# Patient Record
Sex: Female | Born: 1960 | Race: White | Hispanic: No | Marital: Married | Smoking: Never smoker
Health system: Southern US, Community
[De-identification: ages and names within clinical notes are randomized; demographics above are authoritative.]

## PROBLEM LIST (undated history)

## (undated) DIAGNOSIS — T8859XA Other complications of anesthesia, initial encounter: Secondary | ICD-10-CM

## (undated) DIAGNOSIS — H9319 Tinnitus, unspecified ear: Secondary | ICD-10-CM

## (undated) DIAGNOSIS — Z87442 Personal history of urinary calculi: Secondary | ICD-10-CM

## (undated) DIAGNOSIS — F329 Major depressive disorder, single episode, unspecified: Secondary | ICD-10-CM

## (undated) DIAGNOSIS — I83893 Varicose veins of bilateral lower extremities with other complications: Secondary | ICD-10-CM

## (undated) DIAGNOSIS — F32A Depression, unspecified: Secondary | ICD-10-CM

## (undated) DIAGNOSIS — M797 Fibromyalgia: Secondary | ICD-10-CM

## (undated) DIAGNOSIS — G4730 Sleep apnea, unspecified: Secondary | ICD-10-CM

## (undated) DIAGNOSIS — R42 Dizziness and giddiness: Secondary | ICD-10-CM

## (undated) DIAGNOSIS — E049 Nontoxic goiter, unspecified: Secondary | ICD-10-CM

## (undated) DIAGNOSIS — M722 Plantar fascial fibromatosis: Secondary | ICD-10-CM

## (undated) DIAGNOSIS — M1990 Unspecified osteoarthritis, unspecified site: Secondary | ICD-10-CM

## (undated) DIAGNOSIS — N200 Calculus of kidney: Secondary | ICD-10-CM

## (undated) DIAGNOSIS — K219 Gastro-esophageal reflux disease without esophagitis: Secondary | ICD-10-CM

## (undated) DIAGNOSIS — R519 Headache, unspecified: Secondary | ICD-10-CM

## (undated) DIAGNOSIS — T4145XA Adverse effect of unspecified anesthetic, initial encounter: Secondary | ICD-10-CM

## (undated) DIAGNOSIS — M531 Cervicobrachial syndrome: Secondary | ICD-10-CM

## (undated) DIAGNOSIS — I10 Essential (primary) hypertension: Secondary | ICD-10-CM

## (undated) DIAGNOSIS — G243 Spasmodic torticollis: Secondary | ICD-10-CM

## (undated) DIAGNOSIS — H409 Unspecified glaucoma: Secondary | ICD-10-CM

## (undated) DIAGNOSIS — T7840XA Allergy, unspecified, initial encounter: Secondary | ICD-10-CM

## (undated) DIAGNOSIS — E785 Hyperlipidemia, unspecified: Secondary | ICD-10-CM

## (undated) DIAGNOSIS — F419 Anxiety disorder, unspecified: Secondary | ICD-10-CM

## (undated) HISTORY — PX: TONSILLECTOMY: SUR1361

## (undated) HISTORY — DX: Plantar fascial fibromatosis: M72.2

## (undated) HISTORY — DX: Unspecified glaucoma: H40.9

## (undated) HISTORY — DX: Allergy, unspecified, initial encounter: T78.40XA

## (undated) HISTORY — DX: Depression, unspecified: F32.A

## (undated) HISTORY — DX: Major depressive disorder, single episode, unspecified: F32.9

## (undated) HISTORY — PX: CHOLECYSTECTOMY: SHX55

## (undated) HISTORY — DX: Spasmodic torticollis: G24.3

## (undated) HISTORY — PX: COSMETIC SURGERY: SHX468

## (undated) HISTORY — DX: Gastro-esophageal reflux disease without esophagitis: K21.9

## (undated) HISTORY — DX: Fibromyalgia: M79.7

## (undated) HISTORY — DX: Nontoxic goiter, unspecified: E04.9

## (undated) HISTORY — DX: Unspecified osteoarthritis, unspecified site: M19.90

## (undated) HISTORY — DX: Cervicobrachial syndrome: M53.1

## (undated) HISTORY — PX: GALLBLADDER SURGERY: SHX652

## (undated) HISTORY — DX: Hyperlipidemia, unspecified: E78.5

## (undated) HISTORY — DX: Sleep apnea, unspecified: G47.30

---
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
## 2016-10-19 DIAGNOSIS — G243 Spasmodic torticollis: Secondary | ICD-10-CM | POA: Insufficient documentation

## 2017-02-26 ENCOUNTER — Emergency Department
Admission: EM | Admit: 2017-02-26 | Discharge: 2017-02-26 | Disposition: A | Discharge status: Home / Self Care | Payer: BLUE CROSS/BLUE SHIELD | Attending: Emergency Medicine | Admitting: Emergency Medicine

## 2017-02-26 ENCOUNTER — Encounter: Payer: Self-pay | Admitting: Emergency Medicine

## 2017-02-26 ENCOUNTER — Emergency Department: Payer: BLUE CROSS/BLUE SHIELD

## 2017-02-26 DIAGNOSIS — I10 Essential (primary) hypertension: Secondary | ICD-10-CM | POA: Insufficient documentation

## 2017-02-26 DIAGNOSIS — R0789 Other chest pain: Secondary | ICD-10-CM | POA: Diagnosis not present

## 2017-02-26 DIAGNOSIS — R079 Chest pain, unspecified: Secondary | ICD-10-CM

## 2017-02-26 DIAGNOSIS — Z79899 Other long term (current) drug therapy: Secondary | ICD-10-CM | POA: Diagnosis not present

## 2017-02-26 DIAGNOSIS — R109 Unspecified abdominal pain: Secondary | ICD-10-CM | POA: Diagnosis present

## 2017-02-26 HISTORY — DX: Anxiety disorder, unspecified: F41.9

## 2017-02-26 HISTORY — DX: Essential (primary) hypertension: I10

## 2017-02-26 LAB — CBC
HCT: 46 % (ref 35.0–47.0)
Hemoglobin: 15.9 g/dL (ref 12.0–16.0)
MCH: 31 pg (ref 26.0–34.0)
MCHC: 34.5 g/dL (ref 32.0–36.0)
MCV: 89.8 fL (ref 80.0–100.0)
Platelets: 258 10*3/uL (ref 150–440)
RBC: 5.13 MIL/uL (ref 3.80–5.20)
RDW: 13.6 % (ref 11.5–14.5)
WBC: 8 10*3/uL (ref 3.6–11.0)

## 2017-02-26 LAB — TROPONIN I
Troponin I: <0.03 ng/mL (ref <0.03)
Troponin I: <0.03 ng/mL (ref <0.03)

## 2017-02-26 LAB — COMPREHENSIVE METABOLIC PANEL
ALT: 19 U/L (ref 14–54)
AST: 24 U/L (ref 15–41)
Albumin: 4 g/dL (ref 3.5–5.0)
Alkaline Phosphatase: 59 U/L (ref 38–126)
Anion gap: 9 (ref 5–15)
BUN: 9 mg/dL (ref 6–20)
CO2: 26 mmol/L (ref 22–32)
Calcium: 9.3 mg/dL (ref 8.9–10.3)
Chloride: 103 mmol/L (ref 101–111)
Creatinine, Ser: 0.67 mg/dL (ref 0.44–1.00)
GFR calc Af Amer: >60 mL/min (ref >60)
GFR calc non Af Amer: >60 mL/min (ref >60)
Glucose, Bld: 115 mg/dL — ABNORMAL HIGH (ref 65–99)
Potassium: 4.1 mmol/L (ref 3.5–5.1)
Sodium: 138 mmol/L (ref 135–145)
Total Bilirubin: 0.8 mg/dL (ref 0.3–1.2)
Total Protein: 7.1 g/dL (ref 6.5–8.1)

## 2017-02-26 MED ORDER — HYDROCHLOROTHIAZIDE 12.5 MG PO CAPS: 12.5000 mg | ORAL_CAPSULE | Freq: Every day | ORAL | 0 refills | Status: DC

## 2017-02-26 MED ORDER — HYDROCHLOROTHIAZIDE 12.5 MG PO CAPS
12.5000 mg | ORAL_CAPSULE | Freq: Every day | ORAL | Status: DC
  Administered 2017-02-26: 12.5 mg via ORAL
  Filled 2017-02-26: qty 1

## 2017-02-26 MED ORDER — IOPAMIDOL (ISOVUE-370) INJECTION 76%
125.0000 mL | Freq: Once | INTRAVENOUS | Status: AC | PRN
  Administered 2017-02-26: 125 mL via INTRAVENOUS

## 2017-02-26 NOTE — ED Triage Notes (Signed)
Pt in with co chest pain and abd pain tonight states happens nightly for a few months. States has been given meds for anxiety and took it tonight without relief.

## 2017-02-26 NOTE — ED Provider Notes (Signed)
  Physical Exam  BP (!) 177/104   Pulse 83   Resp 14   Ht 5\' 5"  (1.651 m)   Wt 131 lb (59.4 kg)   SpO2 97%   BMI 21.80 kg/m   Physical Exam  ED Course  Procedures  MDM Care assumed at 7 am from Dr. Owens Shark. Patient has been having intermittent chest and abdominal pain for months. Also uncontrolled hypertension. Lisinopril increased several days ago from 2.5 mg to 10 mg but BP still in the 170s. Has hx of anxiety as well and family hx of aortic aneurysm. CT dissection study showed no dissection or aneurysm. Sign out pending second trop.   9:26 AM Second trop neg. Still appears anxious. BP 177/104. I think likely symptomatic hypertension. Discussed either increase lisinopril or add HCTZ as per guidelines. Will prescribe HCTZ 12.5 mg daily. Will have her recheck BP with PCP and possibly stress test if she still has chest pain.        Drenda Freeze, MD 02/26/17 [phone]

## 2017-02-26 NOTE — ED Provider Notes (Signed)
[facility] Emergency Department Provider Note   First MD Initiated Contact with Patient 02/26/17 [phone]     (approximate)  I have reviewed the triage vital signs and the nursing notes.   HISTORY  Chief Complaint Chest Pain   HPI Denise Elliott is a 56 y.o. female with history of hypertension, dystonia and anxiety presents to the emergency department with complaint of chest and abdominal pain with onset tonight. Patient states this has been occurring "for a few months. Patient states symptoms usually occurs at night when she lays down. Patient denies any dyspnea no nausea vomiting no lower extremity pain or swelling. Patient does admit to family history (father) deceased in 33s secondary to a ruptured abdominal aortic aneurysm. Patient states pain score on arrival 8 out of 10.   Past Medical History:  Diagnosis Date  . Anxiety   . Hypertension     There are no active problems to display for this patient.   No past surgical history on file.  Prior to Admission medications   Not on File    Allergies Versed [midazolam]  No family history on file.  Social History Social History  Substance Use Topics  . Smoking status: Not on file  . Smokeless tobacco: Not on file  . Alcohol use Not on file    Review of Systems Constitutional: No fever/chills Eyes: No visual changes. ENT: No sore throat. Cardiovascular: Positive for chest pain. Respiratory: Denies shortness of breath. Gastrointestinal: Positive for abdominal pain.  No nausea, no vomiting.  No diarrhea.  No constipation. Genitourinary: Negative for dysuria. Musculoskeletal: Negative for back pain. Skin: Negative for rash. Neurological: Negative for headaches, focal weakness or numbness.  10-point ROS otherwise negative.  ____________________________________________   PHYSICAL EXAM:  VITAL SIGNS: ED Triage Vitals  Enc Vitals Group     BP --      Pulse --      Resp --      Temp  --      Temp src --      SpO2 --      Weight 02/26/17 0409 131 lb (59.4 kg)     Height 02/26/17 0409 5\' 5"  (1.651 m)     Head Circumference --      Peak Flow --      Pain Score 02/26/17 0410 8     Pain Loc --      Pain Edu? --      Excl. in [place]? --     Constitutional: Alert and oriented. Well appearing and in no acute distress. Eyes: Conjunctivae are normal. PERRL. EOMI. Head: Atraumatic. Mouth/Throat: Mucous membranes are moist.  Oropharynx non-erythematous. Neck: No stridor.   Cardiovascular: Normal rate, regular rhythm. Good peripheral circulation. Grossly normal heart sounds. Respiratory: Normal respiratory effort.  No retractions. Lungs CTAB. Gastrointestinal: Soft and nontender. No distention.  Musculoskeletal: No lower extremity tenderness nor edema. No gross deformities of extremities. Neurologic:  Normal speech and language. No gross focal neurologic deficits are appreciated.  Skin:  Skin is warm, dry and intact. No rash noted. Psychiatric: Mood and affect are normal. Speech and behavior are normal.  ____________________________________________   LABS (all labs ordered are listed, but only abnormal results are displayed)  Labs Reviewed  COMPREHENSIVE METABOLIC PANEL - Abnormal; Notable for the following:       Result Value   Glucose, Bld 115 (*)    All other components within normal limits  CBC  TROPONIN I  TROPONIN I  ____________________________________________  EKG  ED ECG REPORT I, Frederica N Paddock, the attending physician, personally viewed and interpreted this ECG.   Date: 02/26/2017  EKG Time: 4:10 AM  Rate: 90  Rhythm: Normal sinus rhythm  Axis: Normal  Intervals: Normal  ST&T Change: None  ____________________________________________  RADIOLOGY I,  N Hudnall, personally viewed and evaluated these images (plain radiographs) as part of my medical decision making, as well as reviewing the written report by the radiologist.  Ct Angio  Chest Aorta W And/or Wo Contrast  Result Date: 02/26/2017 CLINICAL DATA:  Recurrent chest and abdominal pain at night for few months. Family history of aortic dissection. History of hypertension, cholecystectomy. EXAM: CT ANGIOGRAPHY CHEST, ABDOMEN AND PELVIS TECHNIQUE: Multidetector CT imaging through the chest, abdomen and pelvis was performed using the standard protocol during bolus administration of intravenous contrast. Multiplanar reconstructed images and MIPs were obtained and reviewed to evaluate the vascular anatomy. CONTRAST:  125 cc Isovue 370 COMPARISON:  None. FINDINGS: CTA CHEST FINDINGS CARDIOVASCULAR: Thoracic aorta is normal course and caliber. No intrinsic density on noncontrast CT. Homogeneous contrast opacification of thoracic aorta without dissection, aneurysm, luminal irregularity, periaortic fluid collections, or contrast extravasation. Heart size is normal. No pericardial effusion. No central pulmonary embolism. MEDIASTINUM/NODES: No mediastinal mass or lymphadenopathy by CT size criteria. LUNGS/PLEURA: Tracheobronchial tree is patent, no pneumothorax. No pleural effusions, focal consolidations, pulmonary nodules or masses. MUSCULOSKELETAL:  Nonsuspicious.  Mildly heterogeneous thyroid. Review of the MIP images confirms the above findings. CTA ABDOMEN AND PELVIS FINDINGS ARTERIES: Abdominal aorta is normal course and caliber. Homogeneous contrast opacification of aortoiliac vessels without dissection, aneurysm, luminal irregularity, periaortic fluid collections, or contrast extravasation. Celiac axis, superior and inferior mesenteric arteries are normal. HEPATOBILIARY: Liver is diffusely hypodense compatible with steatosis, otherwise unremarkable. Absent gallbladder. PANCREAS: Normal. SPLEEN: Normal. ADRENALS/URINARY TRACT: Kidneys are orthotopic, demonstrating symmetric enhancement. No nephrolithiasis, hydronephrosis or solid renal masses. Exophytic 12 mm LEFT upper pole cyst. Too small to  characterize hypodensity lower pole of LEFT kidney. The unopacified ureters are normal in course and caliber. Urinary bladder is partially distended and unremarkable. Normal adrenal glands. STOMACH/BOWEL: The stomach, small and large bowel are normal in course and caliber without inflammatory changes. Mild retained large bowel stool. Small amount of small bowel feces compatible with chronic stasis. Normal appendix. VASCULAR/LYMPHATIC: No lymphadenopathy by CT size criteria. REPRODUCTIVE: Normal. OTHER: No intraperitoneal free fluid or free air. MUSCULOSKELETAL: Nonacute.  Scattered Schmorl's nodes. Review of the MIP images confirms the above findings. IMPRESSION: CTA CHEST: No acute vascular process or acute cardiopulmonary disease. CTA ABDOMEN AND PELVIS: No acute vascular process or or acute intra-abdominal/pelvic disease. Electronically Signed   By: Elon Alas M.D.   On: 02/26/2017 06:56   Ct Angio Abd/pel W And/or Wo Contrast  Result Date: 02/26/2017 CLINICAL DATA:  Recurrent chest and abdominal pain at night for few months. Family history of aortic dissection. History of hypertension, cholecystectomy. EXAM: CT ANGIOGRAPHY CHEST, ABDOMEN AND PELVIS TECHNIQUE: Multidetector CT imaging through the chest, abdomen and pelvis was performed using the standard protocol during bolus administration of intravenous contrast. Multiplanar reconstructed images and MIPs were obtained and reviewed to evaluate the vascular anatomy. CONTRAST:  125 cc Isovue 370 COMPARISON:  None. FINDINGS: CTA CHEST FINDINGS CARDIOVASCULAR: Thoracic aorta is normal course and caliber. No intrinsic density on noncontrast CT. Homogeneous contrast opacification of thoracic aorta without dissection, aneurysm, luminal irregularity, periaortic fluid collections, or contrast extravasation. Heart size is normal. No pericardial effusion. No central pulmonary embolism. MEDIASTINUM/NODES:  No mediastinal mass or lymphadenopathy by CT size  criteria. LUNGS/PLEURA: Tracheobronchial tree is patent, no pneumothorax. No pleural effusions, focal consolidations, pulmonary nodules or masses. MUSCULOSKELETAL:  Nonsuspicious.  Mildly heterogeneous thyroid. Review of the MIP images confirms the above findings. CTA ABDOMEN AND PELVIS FINDINGS ARTERIES: Abdominal aorta is normal course and caliber. Homogeneous contrast opacification of aortoiliac vessels without dissection, aneurysm, luminal irregularity, periaortic fluid collections, or contrast extravasation. Celiac axis, superior and inferior mesenteric arteries are normal. HEPATOBILIARY: Liver is diffusely hypodense compatible with steatosis, otherwise unremarkable. Absent gallbladder. PANCREAS: Normal. SPLEEN: Normal. ADRENALS/URINARY TRACT: Kidneys are orthotopic, demonstrating symmetric enhancement. No nephrolithiasis, hydronephrosis or solid renal masses. Exophytic 12 mm LEFT upper pole cyst. Too small to characterize hypodensity lower pole of LEFT kidney. The unopacified ureters are normal in course and caliber. Urinary bladder is partially distended and unremarkable. Normal adrenal glands. STOMACH/BOWEL: The stomach, small and large bowel are normal in course and caliber without inflammatory changes. Mild retained large bowel stool. Small amount of small bowel feces compatible with chronic stasis. Normal appendix. VASCULAR/LYMPHATIC: No lymphadenopathy by CT size criteria. REPRODUCTIVE: Normal. OTHER: No intraperitoneal free fluid or free air. MUSCULOSKELETAL: Nonacute.  Scattered Schmorl's nodes. Review of the MIP images confirms the above findings. IMPRESSION: CTA CHEST: No acute vascular process or acute cardiopulmonary disease. CTA ABDOMEN AND PELVIS: No acute vascular process or or acute intra-abdominal/pelvic disease. Electronically Signed   By: Elon Alas M.D.   On: 02/26/2017 06:56     Procedures     INITIAL IMPRESSION / ASSESSMENT AND PLAN / ED COURSE  Pertinent labs & imaging  results that were available during my care of the patient were reviewed by me and considered in my medical decision making (see chart for details).  56 year old female presenting with midline abdominal and chest pain times "few months". Given presenting complaint an family history of aortic dissection CT angiogram performed of the chest and abdomen which revealed no evidence of aortic aneurysm or dissection. Will obtain repeat troponin. Patient's care transferred to Dr. Darl Householder      ____________________________________________  FINAL CLINICAL IMPRESSION(S) / ED DIAGNOSES  Final diagnoses:  Chest pain, unspecified type     MEDICATIONS GIVEN DURING THIS VISIT:  Medications  iopamidol (ISOVUE-370) 76 % injection 125 mL (125 mLs Intravenous Contrast Given 02/26/17 0629)     NEW OUTPATIENT MEDICATIONS STARTED DURING THIS VISIT:  New Prescriptions   No medications on file    Modified Medications   No medications on file    Discontinued Medications   No medications on file     Note:  This document was prepared using Dragon voice recognition software and may include unintentional dictation errors.    Gregor Hams, MD 02/26/17 [phone]

## 2017-02-26 NOTE — Discharge Instructions (Signed)
Your blood pressure is 177/104 in the ED.   You need to continue taking lisinopril 10 mg daily as prescribed by your doctor.   Add HCTZ 12.5 mg daily.   Recheck blood pressure with your doctor in a week. Consider stress test if you still have chest pain at that time   Return to ER if you have worse chest pain, shortness of breath, abdominal pain, vomiting.

## 2017-03-06 ENCOUNTER — Ambulatory Visit: Payer: 59 | Admitting: Urology

## 2017-03-06 ENCOUNTER — Encounter: Payer: Self-pay | Admitting: Urology

## 2017-03-06 VITALS — BP 136/78 | HR 89 | Ht 65.0 in | Wt 127.0 lb

## 2017-03-06 DIAGNOSIS — N368 Other specified disorders of urethra: Secondary | ICD-10-CM

## 2017-03-06 DIAGNOSIS — R1030 Lower abdominal pain, unspecified: Secondary | ICD-10-CM

## 2017-03-06 DIAGNOSIS — N8111 Cystocele, midline: Secondary | ICD-10-CM

## 2017-03-06 LAB — URINALYSIS, COMPLETE
Bilirubin, UA: NEGATIVE
Glucose, UA: NEGATIVE
Ketones, UA: NEGATIVE
Leukocytes, UA: NEGATIVE
Nitrite, UA: NEGATIVE
Protein, UA: NEGATIVE
Specific Gravity, UA: 1.01 (ref 1.005–1.030)
Urobilinogen, Ur: 0.2 mg/dL (ref 0.2–1.0)
pH, UA: 7 (ref 5.0–7.5)

## 2017-03-06 LAB — MICROSCOPIC EXAMINATION
Epithelial Cells (non renal): >10 /hpf — ABNORMAL HIGH (ref 0–10)
RBC, UA: NONE SEEN /hpf (ref 0–?)

## 2017-03-06 NOTE — Progress Notes (Signed)
03/06/2017 8:20 AM   Denise Elliott Jun 14, 1961 [id number]  Referring provider: Ronita Hipps, MD [place] [address],  Chief Complaint  Patient presents with  . New Patient (Initial Visit)    Urethral disorder referred by Roosvelt Harps    HPI: 56 year old female referred for urethral abnormality.  Mrs. Fullenwider notes that she developed some soreness around her urethral area when wiping about 3 weeks ago. In the mirror, she saw an area of redness with some cheesy material around her clitoris and also felt a lump on the lower right side. This improved spontaneously after about a week. She saw her PCP on 02/18/2017 at which time a pelvic exam described a small opening that expresses serosanguineous appearing "urine "which questionably represented a fistula or cyst per Dr. Helene Kelp.   She does note that over the past month, she's had some lower pelvic midline burning-like pain and an area which she feels is her bladder but only when lying in the supine position. Is not associated with urination and does not improve or worsen with voiding.  This has improved recently after starting Nexium.  She notes no dysuria, gross hematuria, frequency, urgency, or any other signs or symptoms of UTI. She does not have recurrent urinary tract infections.  She has no leakage of left coughing and sneezing. She has had 3 vaginal deliveries. PMH: Past Medical History:  Diagnosis Date  . Anxiety   . Cervical dystonia   . Depression   . Fibromyalgia   . HLD (hyperlipidemia)   . Hypertension   . Sleep apnea     Surgical History: Past Surgical History:  Procedure Laterality Date  . GALLBLADDER SURGERY    . TONSILLECTOMY      Home Medications:  Allergies as of 03/06/2017      Reactions   Versed [midazolam] Other (See Comments)   htn      Medication List       Accurate as of 03/06/17 11:59 PM. Always use your most recent med list.          baclofen 20 MG tablet Commonly  known as:  LIORESAL Take 10 mg by mouth 2 (two) times daily.   CALCIUM ACETATE PO Take by mouth.   citalopram 10 MG tablet Commonly known as:  CELEXA Take 10 mg by mouth daily.   fluticasone 50 MCG/ACT nasal spray Commonly known as:  FLONASE Place into the nose.   hydrochlorothiazide 12.5 MG capsule Commonly known as:  MICROZIDE Take 1 capsule (12.5 mg total) by mouth daily.   lisinopril 10 MG tablet Commonly known as:  PRINIVIL,ZESTRIL Take 10 mg by mouth daily.   Melatonin 3 MG Tabs Take by mouth.   MULTI-VITAMINS Tabs Take 1 tablet by mouth daily.   PREMPRO 0.625-2.5 MG tablet Generic drug:  estrogen (conjugated)-medroxyprogesterone Take 1 tablet by mouth daily.   ranitidine 150 MG capsule Commonly known as:  ZANTAC Take 150 mg by mouth 2 (two) times daily.   triamcinolone ointment 0.1 % Commonly known as:  KENALOG Apply topically.   VITAMIN B COMPLEX PO Take by mouth.   Vitamin D3 1000 units Caps Take by mouth.   ZINC ACETATE PO Take by mouth.       Allergies:  Allergies  Allergen Reactions  . Versed [Midazolam] Other (See Comments)    htn    Family History: Family History  Problem Relation Age of Onset  . Bladder Cancer Neg Hx   . Kidney cancer Neg Hx   .  Prostate cancer Neg Hx     Social History:  reports that she has never smoked. She has never used smokeless tobacco. She reports that she drinks alcohol. She reports that she does not use drugs.  ROS: UROLOGY Frequent Urination?: No Hard to postpone urination?: No Burning/pain with urination?: No Get up at night to urinate?: Yes Leakage of urine?: No Urine stream starts and stops?: No Trouble starting stream?: No Do you have to strain to urinate?: No Blood in urine?: No Urinary tract infection?: No Sexually transmitted disease?: No Injury to kidneys or bladder?: No Painful intercourse?: No Weak stream?: No Currently pregnant?: No Vaginal bleeding?: No Last menstrual period?:  n  Gastrointestinal Nausea?: No Vomiting?: No Indigestion/heartburn?: No Diarrhea?: Yes Constipation?: Yes  Constitutional Fever: No Night sweats?: No Weight loss?: No Fatigue?: No  Skin Skin rash/lesions?: No Itching?: No  Eyes Blurred vision?: No Double vision?: No  Ears/Nose/Throat Sore throat?: No Sinus problems?: Yes  Hematologic/Lymphatic Swollen glands?: No Easy bruising?: Yes  Cardiovascular Leg swelling?: No Chest pain?: No  Respiratory Cough?: No Shortness of breath?: No  Endocrine Excessive thirst?: No  Musculoskeletal Back pain?: No Joint pain?: No  Neurological Headaches?: No Dizziness?: No  Psychologic Depression?: No Anxiety?: Yes  Physical Exam: BP 136/78   Pulse 89   Ht 5\' 5"  (1.651 m)   Wt 127 lb (57.6 kg)   BMI 21.13 kg/m   Constitutional:  Alert and oriented, No acute distress. HEENT: [place] AT, moist mucus membranes.  Trachea midline, no masses. Cardiovascular: No clubbing, cyanosis, or edema. Respiratory: Normal respiratory effort, no increased work of breathing. GI: Abdomen is soft, nontender, nondistended, no abdominal masses GU: Pelvic exam today chaperoned by Fonnie Jarvis. Mild diffuse atrophic vaginitis appreciated. Normal external genitalia. Normal periurethral tissue. No redness or raised area. No urethral diverticulum or fistula appreciated. No significant urethral hypermobility or leakage with Valsalva. Stage I cystocele with Valsalva. Skin: No rashes, bruises or suspicious lesions. Lymph: No inguinal adenopathy. Neurologic: Grossly intact, no focal deficits, moving all 4 extremities. Psychiatric: Normal mood and affect.  Laboratory Data: Lab Results  Component Value Date   WBC 8.0 02/26/2017   HGB 15.9 02/26/2017   HCT 46.0 02/26/2017   MCV 89.8 02/26/2017   PLT 258 02/26/2017    Lab Results  Component Value Date   CREATININE 0.67 02/26/2017   Urinalysis Results for orders placed or performed in visit on  03/06/17  Microscopic Examination  Result Value Ref Range   WBC, UA 0-5 0 - 5 /hpf   RBC, UA None seen 0 - 2 /hpf   Epithelial Cells (non renal) >10 (H) 0 - 10 /hpf   Bacteria, UA Few (A) None seen/Few  Urinalysis, Complete  Result Value Ref Range   Specific Gravity, UA 1.010 1.005 - 1.030   pH, UA 7.0 5.0 - 7.5   Color, UA Yellow Yellow   Appearance Ur Cloudy (A) Clear   Leukocytes, UA Negative Negative   Protein, UA Negative Negative/Trace   Glucose, UA Negative Negative   Ketones, UA Negative Negative   RBC, UA Trace (A) Negative   Bilirubin, UA Negative Negative   Urobilinogen, Ur 0.2 0.2 - 1.0 mg/dL   Nitrite, UA Negative Negative   Microscopic Examination See below:     Pertinent Imaging: n/a  Assessment & Plan:    1. Skene's duct cyst Although not appreciated today, I suspect that this was possibly an infected Skene's duct cyst which spontaneously ruptured [place] on the patient's description  as well as the examination by her PCP.  It appears to have resolved.  No further intervention or workup needed at this time unless symptoms recur. - Urinalysis, Complete  2. Lower abdominal pain Etiology unclear and improving Although I do not suspect the patient has IC, she may benefit from this low acidic diet, information given today  3. Cystocele, midline Mild, asymptomatic   Return if symptoms worsen or fail to improve.  Hollice Espy, MD  [facility] [address] [place], [place] [address] [phone]

## 2017-03-25 DIAGNOSIS — F5105 Insomnia due to other mental disorder: Secondary | ICD-10-CM | POA: Insufficient documentation

## 2017-03-25 DIAGNOSIS — F418 Other specified anxiety disorders: Secondary | ICD-10-CM | POA: Insufficient documentation

## 2017-03-25 DIAGNOSIS — Z8739 Personal history of other diseases of the musculoskeletal system and connective tissue: Secondary | ICD-10-CM | POA: Insufficient documentation

## 2017-03-25 DIAGNOSIS — M5481 Occipital neuralgia: Secondary | ICD-10-CM | POA: Insufficient documentation

## 2017-06-27 ENCOUNTER — Ambulatory Visit (INDEPENDENT_AMBULATORY_CARE_PROVIDER_SITE_OTHER): Payer: 59 | Admitting: Nurse Practitioner

## 2017-06-27 ENCOUNTER — Encounter: Payer: Self-pay | Admitting: Nurse Practitioner

## 2017-06-27 VITALS — BP 125/68 | HR 75 | Temp 98.6°F | Ht 65.5 in | Wt 127.2 lb

## 2017-06-27 DIAGNOSIS — Z1239 Encounter for other screening for malignant neoplasm of breast: Secondary | ICD-10-CM

## 2017-06-27 DIAGNOSIS — Z1231 Encounter for screening mammogram for malignant neoplasm of breast: Secondary | ICD-10-CM | POA: Diagnosis not present

## 2017-06-27 DIAGNOSIS — Z7689 Persons encountering health services in other specified circumstances: Secondary | ICD-10-CM | POA: Diagnosis not present

## 2017-06-27 DIAGNOSIS — F419 Anxiety disorder, unspecified: Secondary | ICD-10-CM

## 2017-06-27 DIAGNOSIS — Z1211 Encounter for screening for malignant neoplasm of colon: Secondary | ICD-10-CM | POA: Diagnosis not present

## 2017-06-27 MED ORDER — LISINOPRIL 10 MG PO TABS: 10.0000 mg | ORAL_TABLET | Freq: Every day | ORAL | 2 refills | Status: DC

## 2017-06-27 MED ORDER — HYDROCHLOROTHIAZIDE 12.5 MG PO CAPS: 12.5000 mg | ORAL_CAPSULE | Freq: Every day | ORAL | 2 refills | Status: DC

## 2017-06-27 MED ORDER — BUSPIRONE HCL 5 MG PO TABS: 5.0000 mg | ORAL_TABLET | Freq: Two times a day (BID) | ORAL | 1 refills | Status: DC

## 2017-06-27 MED ORDER — CONJ ESTROG-MEDROXYPROGEST ACE 0.45-1.5 MG PO TABS: 1.0000 | ORAL_TABLET | Freq: Every day | ORAL | 2 refills | Status: DC

## 2017-06-27 NOTE — Patient Instructions (Addendum)
[place], Thank you for coming in to clinic today.  1. Colon Cancer Screening: - For all adults age 58 and older, routine colon cancer screening is highly recommended. - Early detection of colon cancer is important, because often there are no warning signs or symptoms.  If colon cancer is found early, usually it can be cured. Advanced cancer is hard to treat.  - If you are not interested in Colonoscopy screening (if done and normal you could be cleared for 5 to 10 years until next due), then Cologuard is an excellent alternative for screening test for Colon Cancer. It is highly sensitive for detecting DNA of colon cancer from even the earliest stages. Also, there is NO bowel prep required. - If Cologuard is NEGATIVE, then it is good for 3 years before next due - If Cologuard is POSITIVE, then it is strongly advised to get a Colonoscopy, which allows the GI doctor to locate the source of the cancer or polyp (even very early stage) and treat it by removing it. ------------------------- If you would like to proceed with Cologuard (stool DNA test) - FIRST, call your insurance company and tell them you want to check cost of Cologuard tell them CPT Code [phone] (it may be completely covered with a small or no cost, OR max cost without any coverage is about $600). If you do NOT open the kit, and decide not to do the test, you will NOT be charged, you should contact the company to return the kit if you decide not to do the test. - If you want to proceed, you can notify us (office phone, [place], or at next visit) and we will order it for you. The test kit will be delivered to your house in about 1 week. Follow instructions to collect your stool sample.  You may call the company for any help or questions, 24/7 telephone support at [phone].   2. For your anxiety - stop citalopram and start taking buspirone. START buspirone 5 mg once daily for 1 week.  Increase to twice daily for 1 week.  If  effective at lower dose, may take lower dose.  We can increase the dose if you still need more relief from being anxious.  3. For your blood pressure: - Continue your current medicines  4. Prempro will be the 2nd strength, which you were taking before.  Consider stopping this at any point before you turn 60, preferably sooner.  5. Mammogram Your mammogram order has been placed and is good for 18 months.  Call the Scheduling phone number at [phone] to schedule your mammogram at your convenience.  You can choose to go to either location listed below.  Let the scheduler know which location you prefer.  [facility] [address] [place], [place] [address] [place], [place] [address]      Please schedule a follow-up appointment with [name], AGNP to Return in about 1 month (around 07/28/2017) for GERD/fibromyalgia for one appointment and  annual physical exam in the next 3 months.   If you have any other questions or concerns, please feel free to call the clinic or send a message through [place]. You may also schedule an earlier appointment if necessary.  [name], DNP, AGNP-BC Adult Gerontology Nurse Practitioner [place]

## 2017-06-27 NOTE — Progress Notes (Signed)
Subjective:    Patient ID: Denise Elliott, female    DOB: 06-10-1961, 56 y.o.   MRN: [id number]  Denise Elliott is a 56 y.o. female presenting on 06/27/2017 for Establish Care (needs refills of medication)   HPI Establish Care New Provider Pt last seen by PCP Dr. Helene Kelp 3 months ago.  Obtain records from [facility] for specialists.   BP She is not checking BP at home. Pre recommendation by Dr. Helene Kelp.    - Current medications: lisinopril 10 mg and hctz 12.5 mg, tolerating well without side effects.  - Pt denies headache, lightheadedness, dizziness, changes in vision, chest tightness/pressure, palpitations, leg swelling, sudden loss of speech or loss of consciousness.  When she feels her bp getting high, "feels physically warmer."  Diet: lower salt, moderate to high carbs, moderate fat, veggies and fruit Exercises:   HRT Would like to go down to lower level of hormone w/ Prempro.  Needed to go up at last fill.  W/ menopause pt felt severe hot flashes/night sweats, urinary frequency, irritability and was started on HRT in past.  Has not discussed timeline for stopping them w/ any other provider.  Anxiety Used to take impipramine for fibromyalgia had some anxiety and this med helped her to feel relaxed.  Did not work well for fibromyalgia over time.  Was requiring more meds for relief.  Has been taking citalopram for anxiety and does not feel that it is working.  Has taken since 6/4 and only taking 1/2 tab now for last 1 week b/c was going to run out of pills.  Has been nervous since pre-teen wakes up nervous had violent home life. Now peaceful home w/ husband but has been very nervous lately.  Has taken Mirtazepine 7.5 mg once at bedtime for insomnia - waking in middle of night for several years.  Also listens to clasical music/prays  Lost weight between feb/march w/ multiple stresses w/o appetite.  After treatment changed noted improved mood w/o need for naps reduced anhedonia.   Was unable to drive at that time, but is now is driving again.  Physical activity: Walks 15-20 mins daily.  (Prior 45 mins - reduced r/t heat)  Past Medical History:  Diagnosis Date  . Allergy   . Anxiety   . Arthritis   . Cervical dystonia   . Depression   . Fibromyalgia   . Fibromyalgia   . GERD (gastroesophageal reflux disease)   . Glaucoma    closed angle glaucoma both eyes, laser treatment in past  . Goiter   . HLD (hyperlipidemia)    statin intolerance  . Hypertension   . 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 Sleep apnea   . Sleep apnea   . Sleep apnea    Past Surgical History:  Procedure Laterality Date  . CHOLECYSTECTOMY    . COSMETIC SURGERY Left    cheek bone  . GALLBLADDER SURGERY    . TONSILLECTOMY    . TONSILLECTOMY     Social History   Social History  . Marital status: Married    Spouse name: N/A  . Number of children: N/A  . Years of education: N/A   Occupational History  . Not on file.   Social History Main Topics  . Smoking status: Never Smoker  . Smokeless tobacco: Never Used  . Alcohol use Yes     Comment: socially  . Drug use: No  . Sexual activity: Not on  file   Other Topics Concern  . Not on file   Social History Narrative  . No narrative on file   Family History  Problem Relation Age of Onset  . Colon polyps Mother   . Hypertension Mother   . Goiter Mother   . AAA (abdominal aortic aneurysm) Father   . Alcohol abuse Father   . Healthy Sister   . Healthy Daughter   . Healthy Son   . Colon cancer Maternal Uncle   . Vision loss Maternal Grandmother   . Liver cancer Paternal Grandfather   . Alcohol abuse Paternal Grandfather   . Healthy Sister   . Healthy Sister   . Healthy Son   . Bladder Cancer Neg Hx   . Kidney cancer Neg Hx   . Prostate cancer Neg Hx    Current Outpatient Prescriptions on File Prior to Visit  Medication Sig  . baclofen (LIORESAL) 20 MG tablet Take 10 mg by  mouth 2 (two) times daily.  . ranitidine (ZANTAC) 150 MG capsule Take 150 mg by mouth 2 (two) times daily.  . B Complex Vitamins (VITAMIN B COMPLEX PO) Take by mouth.  . Calcium Acetate, Phos Binder, (CALCIUM ACETATE PO) Take by mouth.  . Cholecalciferol (VITAMIN D3) 1000 units CAPS Take by mouth.  . fluticasone (FLONASE) 50 MCG/ACT nasal spray Place into the nose.  . Melatonin 3 MG TABS Take by mouth.  . Multiple Vitamin (MULTI-VITAMINS) TABS Take 1 tablet by mouth daily.  Marland Kitchen triamcinolone ointment (KENALOG) 0.1 % Apply topically.  . Zinc Acetate, Oral, (ZINC ACETATE PO) Take by mouth.   No current facility-administered medications on file prior to visit.     Review of Systems  Constitutional: Positive for appetite change and fatigue. Negative for activity change.  HENT: Negative.   Eyes: Negative.   Respiratory: Negative.   Gastrointestinal: Negative.   Endocrine: Negative.   Genitourinary: Negative.   Musculoskeletal: Positive for arthralgias, back pain, myalgias, neck pain and neck stiffness.       Torticollis  Skin: Negative.   Allergic/Immunologic: Negative.   Neurological: Positive for headaches.  Hematological: Negative.   Psychiatric/Behavioral: Negative for dysphoric mood, sleep disturbance and suicidal ideas.   Per HPI unless specifically indicated above      Objective:    BP 125/68 (BP Location: Right Arm, Patient Position: Sitting, Cuff Size: Normal)   Pulse 75   Temp 98.6 F (37 C) (Oral)   Ht 5' 5.5" (1.664 m)   Wt 127 lb 3.2 oz (57.7 kg)   BMI 20.85 kg/m    Wt Readings from Last 3 Encounters:  06/27/17 127 lb 3.2 oz (57.7 kg)  03/06/17 127 lb (57.6 kg)  02/26/17 131 lb (59.4 kg)    Physical Exam  General - healthy, well-appearing, NAD HEENT - Normocephalic, atraumatic, PERRL, EOMI, patent nares w/o congestion, oropharynx clear, MMM Neck - supple, tender paraspinal and trapezius muscles to light touch, no LAD, no thyromegaly, no carotid bruit Heart  - RRR, no murmurs heard Lungs - Clear throughout all lobes, no wheezing, crackles, or rhonchi. Normal work of breathing. Abdomen - soft, NTND, no masses, no hepatosplenomegaly, active bowel sounds Extremeties - non-tender, no edema, cap refill < 2 seconds, peripheral pulses intact +2 bilaterally Skin - warm, dry, no rashes Neuro - awake, alert, oriented x3, CN II-X intact, intact muscle strength 5/5 bilaterally, intact distal sensation to light touch, normal coordination, normal gait Psych - Normal mood and affect, normal behavior   Results for  orders placed or performed in visit on 03/06/17  Microscopic Examination  Result Value Ref Range   WBC, UA 0-5 0 - 5 /hpf   RBC, UA None seen 0 - 2 /hpf   Epithelial Cells (non renal) >10 (H) 0 - 10 /hpf   Bacteria, UA Few (A) None seen/Few  Urinalysis, Complete  Result Value Ref Range   Specific Gravity, UA 1.010 1.005 - 1.030   pH, UA 7.0 5.0 - 7.5   Color, UA Yellow Yellow   Appearance Ur Cloudy (A) Clear   Leukocytes, UA Negative Negative   Protein, UA Negative Negative/Trace   Glucose, UA Negative Negative   Ketones, UA Negative Negative   RBC, UA Trace (A) Negative   Bilirubin, UA Negative Negative   Urobilinogen, Ur 0.2 0.2 - 1.0 mg/dL   Nitrite, UA Negative Negative   Microscopic Examination See below:       Assessment & Plan:   Problem List Items Addressed This Visit      Other   Anxiety    Chronic anxiety.  Some past trauma in life.  Pt taking mirtazepine 7.5 mg at bedtime to assist w/ sleep.  Started on citalopram on May 19 2017 and has not noticed any positive effect. Is not willing to try higher dose.   Plan: 1. Start START buspar 5 mg once daily for 1 week.  Increase to twice daily for 1 week and continue at this dose. If effective at lower dose, may take lower dose. 2. Continue mirtazepine 7.5 mg at hs. 3. Recommend counseling for coping w/ trauma.  Work on other Careers information officer. 4. Follow up 4 weeks.        Relevant Medications   mirtazapine (REMERON) 7.5 MG tablet   busPIRone (BUSPAR) 5 MG tablet   Encounter to establish care - Primary    Other Visit Diagnoses    Colon cancer screening       Pt w/o recent colon ca screen.  Prior colonoscopy normal.  No desire to repeat colonoscopy, but is agreeable to cologuard.    Plan: 1. Information provided to request cost sharing from insurance. 2. Order placed for cologuard.   Relevant Orders   Cologuard   Breast cancer screening       Pt w/o recent mammogram. Follows biannual schedule for screening.  Plan: 1. Order for bilat screening mammo placed. 2. Scheduling number provided to pt.   Relevant Orders   MM DIGITAL SCREENING BILATERAL      Meds ordered this encounter  Medications  . mirtazapine (REMERON) 7.5 MG tablet    Sig: Take 7.5 mg by mouth at bedtime.    Refill:  5  . busPIRone (BUSPAR) 5 MG tablet    Sig: Take 1 tablet (5 mg total) by mouth 2 (two) times daily.    Dispense:  60 tablet    Refill:  1    Order Specific Question:   Supervising Provider    Answer:   Olin Hauser [[id number]]  . estrogen, conjugated,-medroxyprogesterone (PREMPRO) 0.45-1.5 MG tablet    Sig: Take 1 tablet by mouth daily.    Dispense:  30 tablet    Refill:  2    Order Specific Question:   Supervising Provider    Answer:   Olin Hauser [[id number]]  . lisinopril (PRINIVIL,ZESTRIL) 10 MG tablet    Sig: Take 1 tablet (10 mg total) by mouth daily.    Dispense:  30 tablet    Refill:  2  Order Specific Question:   Supervising Provider    Answer:   Olin Hauser [[id number]]  . hydrochlorothiazide (MICROZIDE) 12.5 MG capsule    Sig: Take 1 capsule (12.5 mg total) by mouth daily.    Dispense:  30 capsule    Refill:  2    Order Specific Question:   Supervising Provider    Answer:   Olin Hauser [[id number]]      Follow up plan: Return in about 1 month (around 07/28/2017) for GERD/fibromyalgia for one appointment and   annual physical exam in the next 3 months.  [name], DNP, AGPCNP-BC Adult Gerontology Primary Care Nurse Practitioner [facility] 06/29/2017, 11:36 PM

## 2017-06-29 DIAGNOSIS — Z7689 Persons encountering health services in other specified circumstances: Secondary | ICD-10-CM | POA: Insufficient documentation

## 2017-06-29 DIAGNOSIS — F419 Anxiety disorder, unspecified: Secondary | ICD-10-CM | POA: Insufficient documentation

## 2017-06-29 NOTE — Assessment & Plan Note (Signed)
Chronic anxiety.  Some past trauma in life.  Pt taking mirtazepine 7.5 mg at bedtime to assist w/ sleep.  Started on citalopram on May 19 2017 and has not noticed any positive effect. Is not willing to try higher dose.   Plan: 1. Start START buspar 5 mg once daily for 1 week.  Increase to twice daily for 1 week and continue at this dose. If effective at lower dose, may take lower dose. 2. Continue mirtazepine 7.5 mg at hs. 3. Recommend counseling for coping w/ trauma.  Work on other Careers information officer. 4. Follow up 4 weeks.

## 2017-06-30 NOTE — Progress Notes (Signed)
I have reviewed this encounter including the documentation in this note and/or discussed this patient with the provider, [name], AGPCNP-BC. I am certifying that I agree with the content of this note as supervising physician.  [name], [facility] 06/30/2017, 8:33 AM

## 2017-07-01 ENCOUNTER — Ambulatory Visit (INDEPENDENT_AMBULATORY_CARE_PROVIDER_SITE_OTHER): Payer: 59 | Admitting: Urology

## 2017-07-01 ENCOUNTER — Encounter: Payer: Self-pay | Admitting: Urology

## 2017-07-01 VITALS — BP 130/82 | HR 73 | Ht 65.5 in | Wt 128.8 lb

## 2017-07-01 DIAGNOSIS — N9089 Other specified noninflammatory disorders of vulva and perineum: Secondary | ICD-10-CM

## 2017-07-01 DIAGNOSIS — N909 Noninflammatory disorder of vulva and perineum, unspecified: Secondary | ICD-10-CM | POA: Diagnosis not present

## 2017-07-08 ENCOUNTER — Ambulatory Visit (INDEPENDENT_AMBULATORY_CARE_PROVIDER_SITE_OTHER): Payer: 59 | Admitting: Nurse Practitioner

## 2017-07-08 ENCOUNTER — Encounter: Payer: Self-pay | Admitting: Nurse Practitioner

## 2017-07-08 VITALS — BP 111/68 | HR 67 | Temp 97.9°F | Ht 65.5 in | Wt 130.0 lb

## 2017-07-08 DIAGNOSIS — Z13820 Encounter for screening for osteoporosis: Secondary | ICD-10-CM

## 2017-07-08 DIAGNOSIS — Z23 Encounter for immunization: Secondary | ICD-10-CM | POA: Diagnosis not present

## 2017-07-08 DIAGNOSIS — I8393 Asymptomatic varicose veins of bilateral lower extremities: Secondary | ICD-10-CM

## 2017-07-08 DIAGNOSIS — R238 Other skin changes: Secondary | ICD-10-CM | POA: Diagnosis not present

## 2017-07-08 DIAGNOSIS — Z0000 Encounter for general adult medical examination without abnormal findings: Secondary | ICD-10-CM | POA: Diagnosis not present

## 2017-07-08 LAB — COMPREHENSIVE METABOLIC PANEL
ALT: 16 U/L (ref 6–29)
AST: 17 U/L (ref 10–35)
Albumin: 4.1 g/dL (ref 3.6–5.1)
Alkaline Phosphatase: 64 U/L (ref 33–130)
BUN: 14 mg/dL (ref 7–25)
CO2: 25 mmol/L (ref 20–31)
Calcium: 8.9 mg/dL (ref 8.6–10.4)
Chloride: 100 mmol/L (ref 98–110)
Creat: 0.87 mg/dL (ref 0.50–1.05)
Glucose, Bld: 79 mg/dL (ref 65–99)
Potassium: 4 mmol/L (ref 3.5–5.3)
Sodium: 138 mmol/L (ref 135–146)
Total Bilirubin: 0.5 mg/dL (ref 0.2–1.2)
Total Protein: 6.6 g/dL (ref 6.1–8.1)

## 2017-07-08 LAB — CBC WITH DIFFERENTIAL/PLATELET
Basophils Absolute: 0 cells/uL (ref 0–200)
Basophils Relative: 0 %
Eosinophils Absolute: 85 cells/uL (ref 15–500)
Eosinophils Relative: 1 %
HCT: 44.6 % (ref 35.0–45.0)
Hemoglobin: 14.4 g/dL (ref 11.7–15.5)
Lymphocytes Relative: 21 %
Lymphs Abs: 1785 cells/uL (ref 850–3900)
MCH: 30.4 pg (ref 27.0–33.0)
MCHC: 32.3 g/dL (ref 32.0–36.0)
MCV: 94.3 fL (ref 80.0–100.0)
MPV: 9.5 fL (ref 7.5–12.5)
Monocytes Absolute: 255 cells/uL (ref 200–950)
Monocytes Relative: 3 %
Neutro Abs: 6375 cells/uL (ref 1500–7800)
Neutrophils Relative %: 75 %
Platelets: 248 10*3/uL (ref 140–400)
RBC: 4.73 MIL/uL (ref 3.80–5.10)
RDW: 12.4 % (ref 11.0–15.0)
WBC: 8.5 10*3/uL (ref 3.8–10.8)

## 2017-07-08 LAB — LIPID PANEL
Cholesterol: 260 mg/dL — ABNORMAL HIGH (ref <200)
HDL: 76 mg/dL (ref >50)
LDL Cholesterol: 148 mg/dL — ABNORMAL HIGH (ref <100)
Total CHOL/HDL Ratio: 3.4 Ratio (ref <5.0)
Triglycerides: 182 mg/dL — ABNORMAL HIGH (ref <150)
VLDL: 36 mg/dL — ABNORMAL HIGH (ref <30)

## 2017-07-08 LAB — TSH: TSH: 0.82 mIU/L

## 2017-07-08 NOTE — Progress Notes (Signed)
I have reviewed this encounter including the documentation in this note and/or discussed this patient with the provider, [name], AGPCNP-BC. I am certifying that I agree with the content of this note as supervising physician.  [name], [facility] 07/08/2017, 5:23 PM

## 2017-07-08 NOTE — Progress Notes (Signed)
Subjective:    Patient ID: Denise Elliott, female    DOB: Dec 26, 1960, 56 y.o.   MRN: [id number]  Denise Elliott is a 56 y.o. female presenting on 07/08/2017 for Annual Exam   HPI Annual Physical Exam Patient has been feeling well.  They have no acute concerns today. Sleeps 9-5:30 hours per night uninterrupted and takes mirtazapine for sleep w/ significant assistance w/ sleep cycle and blood pressure.  Has sleep study August 1.  HEALTH MAINTENANCE: Weight/BMI: 21.3 - notes she had lost weight over the winter r/t stressful situation Physical activity: walks 20 minutes daily and will walk more w/ better pain control Diet: regular, eats frequent fruits and vegetables Seatbelt: always Sunscreen: prolonged periods PAP: due this year, but will have this at her GYN Mammogram: due this year - order placed DEXA: never in past - due now order placed Colonoscopy: normal in past.  prefers Cologuard for future  VACCINES:  Tetanus: due today  Dusky Feet Notices blue feet affects whole foot bilaterally and seems to be getting worse.  Occurs w/ sitting and prolonged standing.  Has had blue toes in past.  Fingers do not turn blue, but stay cold.  Mother has Reynaud's.  Pt has history of varicose veins.   Past Medical History:  Diagnosis Date  . Allergy   . Anxiety   . Arthritis   . Cervical dystonia   . Depression   . Fibromyalgia   . Fibromyalgia   . GERD (gastroesophageal reflux disease)   . Glaucoma    closed angle glaucoma both eyes, laser treatment in past  . Goiter   . HLD (hyperlipidemia)    statin intolerance  . Hypertension   . 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 Sleep apnea   . Sleep apnea   . Sleep apnea    Past Surgical History:  Procedure Laterality Date  . CHOLECYSTECTOMY    . COSMETIC SURGERY Left    cheek bone  . GALLBLADDER SURGERY    . TONSILLECTOMY    . TONSILLECTOMY     Social History   Social History  .  Marital status: Married    Spouse name: N/A  . Number of children: N/A  . Years of education: N/A   Occupational History  . Not on file.   Social History Main Topics  . Smoking status: Never Smoker  . Smokeless tobacco: Never Used  . Alcohol use Yes     Comment: socially  . Drug use: No  . Sexual activity: Not on file   Other Topics Concern  . Not on file   Social History Narrative  . No narrative on file   Family History  Problem Relation Age of Onset  . Colon polyps Mother   . Hypertension Mother   . Goiter Mother   . AAA (abdominal aortic aneurysm) Father   . Alcohol abuse Father   . Healthy Sister   . Healthy Daughter   . Healthy Son   . Colon cancer Maternal Uncle   . Vision loss Maternal Grandmother   . Liver cancer Paternal Grandfather   . Alcohol abuse Paternal Grandfather   . Healthy Sister   . Healthy Sister   . Healthy Son   . Bladder Cancer Neg Hx   . Kidney cancer Neg Hx   . Prostate cancer Neg Hx    Current Outpatient Prescriptions on File Prior to Visit  Medication Sig  . baclofen (LIORESAL) 10  MG tablet Take 10 mg by mouth 2 (two) times daily.  . busPIRone (BUSPAR) 5 MG tablet Take 1 tablet (5 mg total) by mouth 2 (two) times daily.  Marland Kitchen estrogen, conjugated,-medroxyprogesterone (PREMPRO) 0.45-1.5 MG tablet Take 1 tablet by mouth daily.  . fluticasone (FLONASE) 50 MCG/ACT nasal spray Place into the nose.  . hydrochlorothiazide (MICROZIDE) 12.5 MG capsule Take 1 capsule (12.5 mg total) by mouth daily.  Marland Kitchen lisinopril (PRINIVIL,ZESTRIL) 10 MG tablet Take 1 tablet (10 mg total) by mouth daily.  . mirtazapine (REMERON) 7.5 MG tablet Take 7.5 mg by mouth at bedtime.  . ranitidine (ZANTAC) 150 MG capsule Take 150 mg by mouth 2 (two) times daily.  Marland Kitchen triamcinolone ointment (KENALOG) 0.1 % Apply topically.   No current facility-administered medications on file prior to visit.     Review of Systems  All other systems reviewed and are negative.  Per HPI  unless specifically indicated above      Objective:    BP 111/68 (BP Location: Right Arm, Patient Position: Sitting, Cuff Size: Normal)   Pulse 67   Temp 97.9 F (36.6 C) (Oral)   Ht 5' 5.5" (1.664 m)   Wt 130 lb (59 kg)   BMI 21.30 kg/m   Wt Readings from Last 3 Encounters:  07/08/17 130 lb (59 kg)  07/01/17 128 lb 12.8 oz (58.4 kg)  06/27/17 127 lb 3.2 oz (57.7 kg)    Physical Exam  General - healthy, well-appearing, NAD HEENT - Normocephalic, atraumatic, PERRL, EOMI, patent nares w/o congestion, oropharynx clear, MMM Neck - supple, tender along trapezius and neck paraspinal muscles, no LAD, no thyromegaly, no carotid bruit Heart - RRR, no murmurs heard Lungs - Clear throughout all lobes, no wheezing, crackles, or rhonchi. Normal work of breathing. Abdomen - soft, NTND, no masses, no hepatosplenomegaly, active bowel sounds GU - deferred Breast - Normal exam w/ symmetric breasts, no mass, no nipple discharge, no skin changes or tenderness.   Extremeties - non-tender, no edema, cap refill < 2 seconds when lying flat, bilateral skin of feet dusky w/ cap refill > 2 seconds when seated, radial pulses intact +2 bilaterally, pedal pulses intact +1 bilaterally when lying +3 pedal pulses when seated Skin - warm, dry, no rashes Neuro - awake, alert, oriented x3, CN II-X intact, intact muscle strength 5/5 bilaterally, intact distal sensation to light touch, normal coordination, normal gait Psych - Normal mood and affect, normal behavior   Results for orders placed or performed in visit on 03/06/17  Microscopic Examination  Result Value Ref Range   WBC, UA 0-5 0 - 5 /hpf   RBC, UA None seen 0 - 2 /hpf   Epithelial Cells (non renal) >10 (H) 0 - 10 /hpf   Bacteria, UA Few (A) None seen/Few  Urinalysis, Complete  Result Value Ref Range   Specific Gravity, UA 1.010 1.005 - 1.030   pH, UA 7.0 5.0 - 7.5   Color, UA Yellow Yellow   Appearance Ur Cloudy (A) Clear   Leukocytes, UA Negative  Negative   Protein, UA Negative Negative/Trace   Glucose, UA Negative Negative   Ketones, UA Negative Negative   RBC, UA Trace (A) Negative   Bilirubin, UA Negative Negative   Urobilinogen, Ur 0.2 0.2 - 1.0 mg/dL   Nitrite, UA Negative Negative   Microscopic Examination See below:       Assessment & Plan:   Problem List Items Addressed This Visit    None    Visit  Diagnoses    Encounter for vaccination    -  Primary Pt needs tetanus vaccine.  > 10 years since last vaccination.  Plan: 1. Reviewed tetanus disease and need for vaccination. 2. Administer vaccine today.   Relevant Orders   Tdap vaccine greater than or equal to 7yo IM (Completed)    Encounter for annual physical exam     Physical exam with new findings of dusky lower extremities.  Otherwise, well adult with no acute concerns.  Plan: 1. Obtain health maintenance screenings. 2. Return 1 year for annual physical.   Relevant Orders   TSH   Lipid panel   Hemoglobin A1c   Comprehensive metabolic panel   Vitamin D (25 hydroxy)   CBC with Differential/Platelet   Osteoporosis screening     Pt postmenopausal w/o history of prior DEXA scan.  Pt on HRT estrogen/progesterone for postmenopausal symptoms.  No assessment of bone density.  Pt desires to wean HRT off per previous plan, but may consider continuation of therapy to age 51 as previously suggested by a prior PCP if needed for bone health.  Plan: 1. Obtain DG bone density.     Relevant Orders   DG Bone Density   Dusky feet     Pt w/ dusky feet equal bilaterally while seated.  When lying for physical exam, feet returned to normal color w/ pink tones and normal cap refill.  Pulses +3 while seated, but +1 to +2 while lying.  Pt w/ no prior arterial insufficiency, but well known varicose veins/venous insufficiency w/o edema.  Consider venous insufficiency, arterial insufficiency, or anatomical compression of vascular system w/ position changes.  Plan: 1. Vas [place] abi    2. Consider vein and vascular referral after results received.  Pt not agreeable today, but will go if test results indicate. 3. Wear compression stockings for varicose veins and assess warmth/coolness and color of feet after wearing.  Discussed if worsens w/ cool feet or more dusky appearance, stop wearing compression stockings until further evaluation. 4. Follow up as needed.   Relevant Orders   VAS [place] ABI WITH/WO TBI   Compression stockings   Varicose veins of both lower extremities     Pt w/ longstanding varicose veins w/o complication in past.  She notes prominent veins in foot and at knee.  Plan: 1. Evaluate w/ vas [place] and abi.   2. Treat w/ compression socks while awake- sitting and standing.   Relevant Orders   VAS [place] ABI WITH/WO TBI   Compression stockings          Follow up plan: Return in about 1 year (around 07/08/2018) for annual physical.  [name], DNP, AGPCNP-BC Adult Gerontology Primary Care Nurse Practitioner [facility] 07/08/2017, 10:34 AM

## 2017-07-08 NOTE — Patient Instructions (Signed)
Denise Elliott, Thank you for coming in to clinic today.  1. For your blue, dusky feet and varicose veins, wear compression socks. - lying down allows blood return, so only wear when you are standing/sitting. - We will also obtain a vascular ultrasound and ankle brachial index.  If this shows an abnormality, we will refer you to vascular specialists.  You will get a call to schedule this appointment.  2. Fasting labs today.    You will be due for FASTING BLOOD WORK today. For Lab Results, once available within 2-3 days of blood draw, you can can log in to MyChart online to view your results and a brief explanation. Also, we can discuss results at next follow-up visit.   Please schedule a follow-up appointment with [name], AGNP. Return in about 1 year (around 07/08/2018) for annual physical.  If you have any other questions or concerns, please feel free to call the clinic or send a message through [place]. You may also schedule an earlier appointment if necessary.  You will receive a survey after today's visit either digitally by e-mail or paper by [employer]. Your experiences and feedback matter to [place].  Please respond so we know how we are doing as we provide care for you.   [name], DNP, AGNP-BC Adult Gerontology Nurse Practitioner [facility], [facility]      Chronic Venous Insufficiency Chronic venous insufficiency, also called venous stasis, is a condition that prevents blood from being pumped effectively through the veins in your legs. Blood may no longer be pumped effectively from the legs back to the heart. This condition can range from mild to severe. With proper treatment, you should be able to continue with an active life. What are the causes? Chronic venous insufficiency occurs when the vein walls become stretched, weakened, or damaged, or when valves within the vein are damaged. Some common causes of this include:  High blood pressure inside the veins  (venous hypertension).  Increased blood pressure in the leg veins from long periods of sitting or standing.  A blood clot that blocks blood flow in a vein (deep vein thrombosis, DVT).  Inflammation of a vein (phlebitis) that causes a blood clot to form.  Tumors in the pelvis that cause blood to back up.  What increases the risk? The following factors may make you more likely to develop this condition:  Having a family history of this condition.  Obesity.  Pregnancy.  Living without enough physical activity or exercise (sedentary lifestyle).  Smoking.  Having a job that requires long periods of standing or sitting in one place.  Being a certain age. Women in their 10s and 13s and men in their 74s are more likely to develop this condition.  What are the signs or symptoms? Symptoms of this condition include:  Veins that are enlarged, bulging, or twisted (varicose veins).  Skin breakdown or ulcers.  Reddened or discolored skin on the front of the leg.  [place], smooth, tight, and painful skin just above the ankle, usually on the inside of the leg (lipodermatosclerosis).  Swelling.  How is this diagnosed? This condition may be diagnosed based on:  Your medical history.  A physical exam.  Tests, such as: ? A procedure that creates an image of a blood vessel and nearby organs and provides information about blood flow through the blood vessel (duplex ultrasound). ? A procedure that tests blood flow (plethysmography). ? A procedure to look at the veins using X-ray and dye (  venogram).  How is this treated? The goals of treatment are to help you return to an active life and to minimize pain or disability. Treatment depends on the severity of your condition, and it may include:  Wearing compression stockings. These can help relieve symptoms and help prevent your condition from getting worse. However, they do not cure the condition.  Sclerotherapy. This is a procedure  involving an injection of a material that "dissolves" damaged veins.  Surgery. This may involve: ? Removing a diseased vein (vein stripping). ? Cutting off blood flow through the vein (laser ablation surgery). ? Repairing a valve.  Follow these instructions at home:  Wear compression stockings as told by your health care provider. These stockings help to prevent blood clots and reduce swelling in your legs.  Take over-the-counter and prescription medicines only as told by your health care provider.  Stay active by exercising, walking, or doing different activities. Ask your health care provider what activities are safe for you and how much exercise you need.  Drink enough fluid to keep your urine clear or pale yellow.  Do not use any products that contain nicotine or tobacco, such as cigarettes and e-cigarettes. If you need help quitting, ask your health care provider.  Keep all follow-up visits as told by your health care provider. This is important. Contact a health care provider if:  You have redness, swelling, or more pain in the affected area.  You see a red streak or line that extends up or down from the affected area.  You have skin breakdown or a loss of skin in the affected area, even if the breakdown is small.  You get an injury in the affected area. Get help right away if:  You get an injury and an open wound in the affected area.  You have severe pain that does not get better with medicine.  You have sudden numbness or weakness in the foot or ankle below the affected area, or you have trouble moving your foot or ankle.  You have a fever and you have worse or persistent symptoms.  You have chest pain.  You have shortness of breath. Summary  Chronic venous insufficiency, also called venous stasis, is a condition that prevents blood from being pumped effectively through the veins in your legs.  Chronic venous insufficiency occurs when the vein walls become  stretched, weakened, or damaged, or when valves within the vein are damaged.  Treatment for this condition depends on how severe your condition is, and it may involve wearing compression stockings or having a procedure.  Make sure you stay active by exercising, walking, or doing different activities. Ask your health care provider what activities are safe for you and how much exercise you need. This information is not intended to replace advice given to you by your health care provider. Make sure you discuss any questions you have with your health care provider. Document Released: 04/07/2007 Document Revised: 10/21/2016 Document Reviewed: 10/21/2016 Elsevier Interactive Patient Education  2017 [employer].

## 2017-07-09 LAB — HEMOGLOBIN A1C
Hgb A1c MFr Bld: 5.1 % (ref <5.7)
Mean Plasma Glucose: 100 mg/dL

## 2017-07-09 LAB — VITAMIN D 25 HYDROXY (VIT D DEFICIENCY, FRACTURES): Vit D, 25-Hydroxy: 29 ng/mL — ABNORMAL LOW (ref 30–100)

## 2017-07-14 NOTE — Progress Notes (Signed)
07/01/2017 3:51 PM   Denise Elliott 1961-10-21 [id number]  Referring provider: Mikey College, NP [place], [place] [address]  Chief Complaint  Patient presents with  . Follow-up    HPI: 56 year old female who was previously seen and evaluated for possible periurethral lesion in 02/2017. She returns today after having a recurrence a painful lump just to the right of her clitoris which was tender for approximately 3 days. This never drained but has since resolved.  This is the second time she is presented with similar finding. She denies any lesions around her urethra itself.  She notes no dysuria, gross hematuria, frequency, urgency, or any other signs or symptoms of UTI. She does not have recurrent urinary tract infections.  She has no leakage of left coughing and sneezing. She has had 3 vaginal deliveries.  PMH: Past Medical History:  Diagnosis Date  . Allergy   . Anxiety   . Arthritis   . Cervical dystonia   . Depression   . Fibromyalgia   . Fibromyalgia   . GERD (gastroesophageal reflux disease)   . Glaucoma    closed angle glaucoma both eyes, laser treatment in past  . Goiter   . HLD (hyperlipidemia)    statin intolerance  . Hypertension   . 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 Sleep apnea   . Sleep apnea   . Sleep apnea     Surgical History: Past Surgical History:  Procedure Laterality Date  . CHOLECYSTECTOMY    . COSMETIC SURGERY Left    cheek bone  . GALLBLADDER SURGERY    . TONSILLECTOMY    . TONSILLECTOMY      Home Medications:  Allergies as of 07/01/2017      Reactions   Versed [midazolam] Hives, Other (See Comments)   BP increased unable to talk BP increased unable to hear      Medication List       Accurate as of 07/01/17 11:59 PM. Always use your most recent med list.          baclofen 10 MG tablet Commonly known as:  LIORESAL Take 10 mg by mouth 2 (two) times daily.     busPIRone 5 MG tablet Commonly known as:  BUSPAR Take 1 tablet (5 mg total) by mouth 2 (two) times daily.   estrogen (conjugated)-medroxyprogesterone 0.45-1.5 MG tablet Commonly known as:  PREMPRO Take 1 tablet by mouth daily.   fluticasone 50 MCG/ACT nasal spray Commonly known as:  FLONASE Place into the nose.   hydrochlorothiazide 12.5 MG capsule Commonly known as:  MICROZIDE Take 1 capsule (12.5 mg total) by mouth daily.   lisinopril 10 MG tablet Commonly known as:  PRINIVIL,ZESTRIL Take 1 tablet (10 mg total) by mouth daily.   mirtazapine 7.5 MG tablet Commonly known as:  REMERON Take 7.5 mg by mouth at bedtime.   ranitidine 150 MG capsule Commonly known as:  ZANTAC Take 150 mg by mouth 2 (two) times daily.   triamcinolone ointment 0.1 % Commonly known as:  KENALOG Apply topically.       Allergies:  Allergies  Allergen Reactions  . Versed [Midazolam] Hives and Other (See Comments)    BP increased unable to talk BP increased unable to hear    Family History: Family History  Problem Relation Age of Onset  . Colon polyps Mother   . Hypertension Mother   . Goiter Mother   . AAA (abdominal aortic aneurysm)  Father   . Alcohol abuse Father   . Healthy Sister   . Healthy Daughter   . Healthy Son   . Colon cancer Maternal Uncle   . Vision loss Maternal Grandmother   . Liver cancer Paternal Grandfather   . Alcohol abuse Paternal Grandfather   . Healthy Sister   . Healthy Sister   . Healthy Son   . Bladder Cancer Neg Hx   . Kidney cancer Neg Hx   . Prostate cancer Neg Hx     Social History:  reports that she has never smoked. She has never used smokeless tobacco. She reports that she drinks alcohol. She reports that she does not use drugs.  ROS: UROLOGY Frequent Urination?: No Hard to postpone urination?: No Burning/pain with urination?: No Get up at night to urinate?: No Leakage of urine?: No Urine stream starts and stops?: No Trouble starting  stream?: No Do you have to strain to urinate?: No Blood in urine?: No Urinary tract infection?: No Sexually transmitted disease?: No Injury to kidneys or bladder?: No Painful intercourse?: No Weak stream?: No Currently pregnant?: No Vaginal bleeding?: No Last menstrual period?: n  Gastrointestinal Nausea?: No Vomiting?: No Indigestion/heartburn?: No Diarrhea?: No Constipation?: No  Constitutional Fever: No Night sweats?: No Weight loss?: No Fatigue?: No  Skin Skin rash/lesions?: No Itching?: No  Eyes Blurred vision?: No Double vision?: No  Ears/Nose/Throat Sore throat?: No Sinus problems?: Yes  Hematologic/Lymphatic Swollen glands?: No Easy bruising?: No  Cardiovascular Leg swelling?: No Chest pain?: No  Respiratory Cough?: Yes Shortness of breath?: No  Endocrine Excessive thirst?: No  Musculoskeletal Back pain?: No Joint pain?: No  Neurological Headaches?: No Dizziness?: No  Psychologic Depression?: No Anxiety?: No  Physical Exam: BP 130/82 (BP Location: Left Arm, Patient Position: Sitting, Cuff Size: Normal)   Pulse 73   Ht 5' 5.5" (1.664 m)   Wt 128 lb 12.8 oz (58.4 kg)   BMI 21.11 kg/m   Constitutional:  Alert and oriented, No acute distress.  Exam today is unchanged.   HEENT: [place] AT, moist mucus membranes.  Trachea midline, no masses. Cardiovascular: No clubbing, cyanosis, or edema. Respiratory: Normal respiratory effort, no increased work of breathing. GI: Abdomen is soft, nontender, nondistended, no abdominal masses GU: Pelvic exam today chaperoned. Mild diffuse atrophic vaginitis appreciated. Normal external genitalia.  Normal clitoris. Normal periurethral tissue. No redness or raised area. No urethral diverticulum or fistula appreciated. No significant urethral hypermobility or leakage with Valsalva. Stage I cystocele with Valsalva. Skin: No rashes, bruises or suspicious lesions. Neurologic: Grossly intact, no focal deficits,  moving all 4 extremities. Psychiatric: Normal mood and affect.  Laboratory Data: Lab Results  Component Value Date   WBC 8.5 07/08/2017   HGB 14.4 07/08/2017   HCT 44.6 07/08/2017   MCV 94.3 07/08/2017   PLT 248 07/08/2017    Lab Results  Component Value Date   CREATININE 0.87 07/08/2017    Pertinent Imaging: n/a  Assessment & Plan:    1. Vulvar mass Returns again today complaining of redness and mass like area just to the right of her clitoris Again today on exam, this is not appreciated but her pain and symptoms have since resolved I have advised her to return when she is having symptoms for same day visit Given that the lesion is not periurethral, I have recommended referral to an OB/GYN to which she is agreeable -amb referral to ob/gyn  Hollice Espy, MD  [facility]  [place], [place] [address] [phone])  [phone]  

## 2017-07-16 ENCOUNTER — Ambulatory Visit: Payer: BLUE CROSS/BLUE SHIELD | Attending: Neurology

## 2017-07-16 DIAGNOSIS — G4733 Obstructive sleep apnea (adult) (pediatric): Secondary | ICD-10-CM | POA: Diagnosis not present

## 2017-07-16 DIAGNOSIS — R0683 Snoring: Secondary | ICD-10-CM | POA: Diagnosis present

## 2017-07-22 ENCOUNTER — Ambulatory Visit
Admission: RE | Admit: 2017-07-22 | Discharge: 2017-07-22 | Disposition: A | Discharge status: Home / Self Care | Payer: BLUE CROSS/BLUE SHIELD | Source: Ambulatory Visit | Attending: Nurse Practitioner | Admitting: Nurse Practitioner

## 2017-07-22 DIAGNOSIS — M8589 Other specified disorders of bone density and structure, multiple sites: Secondary | ICD-10-CM | POA: Diagnosis not present

## 2017-07-22 DIAGNOSIS — Z1231 Encounter for screening mammogram for malignant neoplasm of breast: Secondary | ICD-10-CM | POA: Insufficient documentation

## 2017-07-22 DIAGNOSIS — Z13820 Encounter for screening for osteoporosis: Secondary | ICD-10-CM

## 2017-07-22 DIAGNOSIS — Z1239 Encounter for other screening for malignant neoplasm of breast: Secondary | ICD-10-CM

## 2017-07-25 ENCOUNTER — Other Ambulatory Visit: Payer: Self-pay | Admitting: Nurse Practitioner

## 2017-07-25 MED ORDER — CONJ ESTROG-MEDROXYPROGEST ACE 0.45-1.5 MG PO TABS: 1.0000 | ORAL_TABLET | Freq: Every day | ORAL | 1 refills | Status: DC

## 2017-07-28 ENCOUNTER — Inpatient Hospital Stay
Admission: RE | Admit: 2017-07-28 | Discharge: 2017-07-28 | Disposition: A | Discharge status: Home / Self Care | Payer: Self-pay | Source: Ambulatory Visit | Attending: *Deleted | Admitting: *Deleted

## 2017-07-28 ENCOUNTER — Other Ambulatory Visit: Payer: Self-pay | Admitting: Nurse Practitioner

## 2017-07-28 ENCOUNTER — Other Ambulatory Visit: Payer: Self-pay | Admitting: *Deleted

## 2017-07-28 DIAGNOSIS — N6310 Unspecified lump in the right breast, unspecified quadrant: Secondary | ICD-10-CM

## 2017-07-28 DIAGNOSIS — Z9289 Personal history of other medical treatment: Secondary | ICD-10-CM

## 2017-07-29 ENCOUNTER — Ambulatory Visit (INDEPENDENT_AMBULATORY_CARE_PROVIDER_SITE_OTHER): Payer: 59 | Admitting: Nurse Practitioner

## 2017-07-29 ENCOUNTER — Encounter: Payer: Self-pay | Admitting: Nurse Practitioner

## 2017-07-29 VITALS — BP 102/73 | HR 78 | Temp 98.0°F | Ht 65.5 in | Wt 127.2 lb

## 2017-07-29 DIAGNOSIS — K219 Gastro-esophageal reflux disease without esophagitis: Secondary | ICD-10-CM

## 2017-07-29 DIAGNOSIS — M1990 Unspecified osteoarthritis, unspecified site: Secondary | ICD-10-CM | POA: Insufficient documentation

## 2017-07-29 DIAGNOSIS — G243 Spasmodic torticollis: Secondary | ICD-10-CM | POA: Insufficient documentation

## 2017-07-29 DIAGNOSIS — M797 Fibromyalgia: Secondary | ICD-10-CM | POA: Diagnosis not present

## 2017-07-29 DIAGNOSIS — Z1211 Encounter for screening for malignant neoplasm of colon: Secondary | ICD-10-CM | POA: Diagnosis not present

## 2017-07-29 DIAGNOSIS — I999 Unspecified disorder of circulatory system: Secondary | ICD-10-CM

## 2017-07-29 DIAGNOSIS — E782 Mixed hyperlipidemia: Secondary | ICD-10-CM

## 2017-07-29 DIAGNOSIS — I10 Essential (primary) hypertension: Secondary | ICD-10-CM | POA: Insufficient documentation

## 2017-07-29 DIAGNOSIS — E785 Hyperlipidemia, unspecified: Secondary | ICD-10-CM | POA: Insufficient documentation

## 2017-07-29 MED ORDER — OMEPRAZOLE 20 MG PO CPDR: 20.0000 mg | DELAYED_RELEASE_CAPSULE | Freq: Every day | ORAL | 1 refills | Status: DC

## 2017-07-29 NOTE — Assessment & Plan Note (Signed)
Pt notes whole foot bilaterally will become dusky w/ sitting or standing for 20-30 minutes.  Color returns after lying flat.  Likely compressive symptoms or vascular insufficiency.  Plan: 1. Vascular doppler studies of bilateral lower extremities w/ abi/tbi. 2. Consider vascular specialist referral if needed after study. 3. Recommended pt to try compression socks.  If worsening symptoms, stop wearing the socks. 4. Follow up as needed.

## 2017-07-29 NOTE — Assessment & Plan Note (Addendum)
Pt w/ recent lab check for hyperlipidemia indicates mixed dyslipidemia.  Pt previously has tried multiple statins w/ failures 2/2 myalgias at another medical facility.    Plan: 1. Continue your fish oil 1,000 mg once daily. 2. Improve your diet to adhere to low glycemic diet. 3. Continue daily exercise. 4. Follow up 6 months. - Consider PCSK-9 inhibitors in future if no improtement.

## 2017-07-29 NOTE — Assessment & Plan Note (Signed)
Pt notes stable symptoms today.  Has daily pain that requires rest periods and pacing activities to prevent pain.  Has used mostly complementary and alternative therapies in recent months.  Had taken imipramine for 25 years and stopped.  Has had tolerable symptoms and does not desire to change current treatment.  Plan: 1. Continue regular exercise. 2. Continue using topical aromatherapy as needed. Discouraged use of ingested aromatherapy oils for any purpose. 3. Continue ibuprofen as needed. 4. Likely finding some relief w/ remeron as early morning symptoms seem to have improved after starting this medication.  Continue as directed by neurology. 5. Follow up as needed.

## 2017-07-29 NOTE — Progress Notes (Signed)
Subjective:    Patient ID: Denise Elliott, female    DOB: 25-Mar-1961, 56 y.o.   MRN: [id number]  Denise Elliott is a 56 y.o. female presenting on 07/29/2017 for Gastroesophageal Reflux (fibromyalgia )   HPI TEST RESULTS: - Labs reviewed: Cholesterol (see below), vitamin D - pt wants recommendations for how to proceed and confirm plan Continue OTC vitamin D w/ 2,000 IU once daily as previously taking - Mammogram: needs follow up-orders placed, but [facility] will call pt to schedule - DEXA: wants confirmation of calcium recommendation - Reviewed 1500 mg total Calcium supplementation daily w/ diet and supplement  GERD: Takes Zantac 2 tablets 1 hour before bed w/ glass of water.  Initial symptoms started about 6 months ago.  Symptoms included pressure in bladder, burning stomach, pins and needles in chest then down arms.  Neurologist w/ initiation of mirtazapine and has had improvement.  Return of symptoms w/ decreasing dose to one tablet.  FIBROMYALGIA: Prior treatment: PT 1 year ago (dry needling), pain meds (imipramine 25 years) Occasionally takes advil.  - Blue deep cream by Derek Jack - works well for cervical  Arthritis, beddie buddy.  - Daily activities are difficult, but pain does not prevent scheduled activities.   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 - Stretching helps, walks daily and hot showers. - Takes frequent rest periods.  Reading when unbearable.  HYPERLIPIDEMIA: Has taken statins in past r/t muscle pain in thighs - tried two or more statin medications.  Has started taking fish oil and is asking about prickly pear supplement today.  Pt eats healthy diet w/ few fried foods, high fiber, lean proteins. - ASCVD screening: No prior MI/CVA. Pt denies headache, lightheadedness, dizziness, changes in vision, chest tightness/pressure, palpitations, leg swelling, sudden loss of speech or loss of consciousness. - Pt also still on  HRT.  Unwilling to stop at this time.  W/ lower dose of HRT has had mild hot flash w/ only feeling hot. Notes a little more crankiness.  Pt establishing relationship w/ GYN Dr. Marcelline Mates at [facility] in a few weeks.   Social History  Substance Use Topics  . Smoking status: Never Smoker  . Smokeless tobacco: Never Used  . Alcohol use Yes     Comment: socially    Review of Systems Per HPI unless specifically indicated above    Objective:    BP 102/73 (BP Location: Right Arm, Patient Position: Sitting, Cuff Size: Normal)   Pulse 78   Temp 98 F (36.7 C) (Oral)   Ht 5' 5.5" (1.664 m)   Wt 127 lb 3.2 oz (57.7 kg)   BMI 20.85 kg/m   Wt Readings from Last 3 Encounters:  07/29/17 127 lb 3.2 oz (57.7 kg)  07/08/17 130 lb (59 kg)  07/01/17 128 lb 12.8 oz (58.4 kg)    Physical Exam  General - healthy, well-appearing, NAD HEENT - Normocephalic, atraumatic Neck - supple, non-tender, no LAD, no carotid bruit Heart - RRR, no murmurs heard Lungs - Clear throughout all lobes, no wheezing, crackles, or rhonchi. Normal work of breathing. Extremeties - non-tender, no edema, cap refill < 2 seconds, peripheral pulses intact +2 bilaterally, persistent cool toes/feet after sitting w/ return of color when lying flat Skin - warm, dry Neuro - awake, alert, oriented x3, normal gait Psych - Normal mood and affect, normal behavior    Results for orders placed or performed in visit on 07/08/17  TSH  Result Value Ref Range   TSH 0.82 mIU/L  Lipid panel  Result Value Ref Range   Cholesterol 260 (H) <200 mg/dL   Triglycerides 182 (H) <150 mg/dL   HDL 76 >50 mg/dL   Total CHOL/HDL Ratio 3.4 <5.0 Ratio   VLDL 36 (H) <30 mg/dL   LDL Cholesterol 148 (H) <100 mg/dL  Hemoglobin A1c  Result Value Ref Range   Hgb A1c MFr Bld 5.1 <5.7 %   Mean Plasma Glucose 100 mg/dL  Comprehensive metabolic panel  Result Value Ref Range   Sodium 138 135 - 146 mmol/L   Potassium 4.0 3.5 - 5.3 mmol/L   Chloride 100 98  - 110 mmol/L   CO2 25 20 - 31 mmol/L   Glucose, Bld 79 65 - 99 mg/dL   BUN 14 7 - 25 mg/dL   Creat 0.87 0.50 - 1.05 mg/dL   Total Bilirubin 0.5 0.2 - 1.2 mg/dL   Alkaline Phosphatase 64 33 - 130 U/L   AST 17 10 - 35 U/L   ALT 16 6 - 29 U/L   Total Protein 6.6 6.1 - 8.1 g/dL   Albumin 4.1 3.6 - 5.1 g/dL   Calcium 8.9 8.6 - 10.4 mg/dL  Vitamin D (25 hydroxy)  Result Value Ref Range   Vit D, 25-Hydroxy 29 (L) 30 - 100 ng/mL  CBC with Differential/Platelet  Result Value Ref Range   WBC 8.5 3.8 - 10.8 K/uL   RBC 4.73 3.80 - 5.10 MIL/uL   Hemoglobin 14.4 11.7 - 15.5 g/dL   HCT 44.6 35.0 - 45.0 %   MCV 94.3 80.0 - 100.0 fL   MCH 30.4 27.0 - 33.0 pg   MCHC 32.3 32.0 - 36.0 g/dL   RDW 12.4 11.0 - 15.0 %   Platelets 248 140 - 400 K/uL   MPV 9.5 7.5 - 12.5 fL   Neutro Abs 6,375 1,500 - 7,800 cells/uL   Lymphs Abs 1,785 850 - 3,900 cells/uL   Monocytes Absolute 255 200 - 950 cells/uL   Eosinophils Absolute 85 15 - 500 cells/uL   Basophils Absolute 0 0 - 200 cells/uL   Neutrophils Relative % 75 %   Lymphocytes Relative 21 %   Monocytes Relative 3 %   Eosinophils Relative 1 %   Basophils Relative 0 %   Smear Review Criteria for review not met       Assessment & Plan:   Problem List Items Addressed This Visit      Cardiovascular and Mediastinum   Circulation disorder of lower extremity    Pt notes whole foot bilaterally will become dusky w/ sitting or standing for 20-30 minutes.  Color returns after lying flat.  Likely compressive symptoms or vascular insufficiency.  Plan: 1. Vascular doppler studies of bilateral lower extremities w/ abi/tbi. 2. Consider vascular specialist referral if needed after study. 3. Recommended pt to try compression socks.  If worsening symptoms, stop wearing the socks. 4. Follow up as needed.        Digestive   GERD (gastroesophageal reflux disease)    Improved w/ Zantac, but w/ lower dose symptoms are returning.  Pt notices symptoms most at  night.  Plan: 1. STOP Zantac 2. START omeprazole 20 mg once daily x 2 weeks.  Continue for 3-6 months then transition back to Zantac if needed.   3. Reviewed dietary approaches for management. 4. Continue probiotic if desired. 5. Follow up 6 months.      Relevant Medications   Probiotic Product (  PROBIOTIC-10) CAPS   omeprazole (PRILOSEC) 20 MG capsule     Other   Fibromyalgia - Primary    Pt notes stable symptoms today.  Has daily pain that requires rest periods and pacing activities to prevent pain.  Has used mostly complementary and alternative therapies in recent months.  Had taken imipramine for 25 years and stopped.  Has had tolerable symptoms and does not desire to change current treatment.  Plan: 1. Continue regular exercise. 2. Continue using topical aromatherapy as needed. Discouraged use of ingested aromatherapy oils for any purpose. 3. Continue ibuprofen as needed. 4. Likely finding some relief w/ remeron as early morning symptoms seem to have improved after starting this medication.  Continue as directed by neurology. 5. Follow up as needed.       HLD (hyperlipidemia)    Pt w/ recent lab check for hyperlipidemia indicates mixed dyslipidemia.  Pt previously has tried multiple statins w/ failures 2/2 myalgias at another medical facility.    Plan: 1. Continue your fish oil 1,000 mg once daily. 2. Improve your diet to adhere to low glycemic diet. 3. Continue daily exercise. 4. Follow up 6 months. - Consider PCSK-9 inhibitors in future if no improtement.       Other Visit Diagnoses    Colon cancer screening     Pt requiring colon cancer screening.  No family history of colon cancer.  Previously planned to perform cologuard, but pt notes presence of hemorrhoids that occasionally bleed.  Plan: - Mutual decision making discussion for options of colonoscopy vs cologuard.  Pt declines both colonoscopy and cologuard.  Prefers alternative option despite less specificity.     - Provided 3-card hemoccult take home test   Relevant Orders   Guiac Stool Card-TAKE HOME      Meds ordered this encounter  Medications  . Omega-3 Fatty Acids (FISH OIL) 1000 MG CAPS    Sig: Take 1 capsule by mouth.  . cholecalciferol (VITAMIN D) 1000 units tablet    Sig: Take 1,000 Units by mouth daily.  . Multiple Vitamin (MULTIVITAMIN) tablet    Sig: Take 1 tablet by mouth daily.  . Probiotic Product (PROBIOTIC-10) CAPS    Sig: Take by mouth.      Follow up plan: Return in about 5 months (around 12/29/2017) for blood pressure, cholesterol, GERD.   [name], DNP, AGPCNP-BC Adult Gerontology Primary Care Nurse Practitioner [facility] 07/29/2017, 9:47 AM

## 2017-07-29 NOTE — Patient Instructions (Addendum)
Denise Elliott, Thank you for coming in to clinic today.  1. Continue your alternative therapies for your fibromyalgia.  2. For your cholesterol: - Continue your fish oil - Improve your diet to adhere to low glycemic diet. - Exercise daily continue   3. CHANGES for acid reflux  - STOP Zantac - START omeprazole 20 mg once daily.  Please schedule a follow-up appointment with [name], AGNP.  Return in about 5 months (around 12/29/2017) for blood pressure, cholesterol, GERD.  If you have any other questions or concerns, please feel free to call the clinic or send a message through [place]. You may also schedule an earlier appointment if necessary.  You will receive a survey after today's visit either digitally by e-mail or paper by [employer]. Your experiences and feedback matter to [place].  Please respond so we know how we are doing as we provide care for you.   [name], DNP, AGNP-BC Adult Gerontology Nurse Practitioner [place]

## 2017-08-01 NOTE — Assessment & Plan Note (Signed)
Improved w/ Zantac, but w/ lower dose symptoms are returning.  Pt notices symptoms most at night.  Plan: 1. STOP Zantac 2. START omeprazole 20 mg once daily x 2 weeks.  Continue for 3-6 months then transition back to Zantac if needed.   3. Reviewed dietary approaches for management. 4. Continue probiotic if desired. 5. Follow up 6 months.

## 2017-08-01 NOTE — Progress Notes (Signed)
I have reviewed this encounter including the documentation in this note and/or discussed this patient with the provider, [name], AGPCNP-BC. I am certifying that I agree with the content of this note as supervising physician.  [name], [facility] 08/01/2017, 8:21 AM

## 2017-08-05 ENCOUNTER — Encounter: Payer: 59 | Admitting: Obstetrics and Gynecology

## 2017-08-07 ENCOUNTER — Ambulatory Visit
Admission: RE | Admit: 2017-08-07 | Discharge: 2017-08-07 | Disposition: A | Discharge status: Home / Self Care | Payer: BLUE CROSS/BLUE SHIELD | Source: Ambulatory Visit | Attending: Nurse Practitioner | Admitting: Nurse Practitioner

## 2017-08-07 DIAGNOSIS — N6310 Unspecified lump in the right breast, unspecified quadrant: Secondary | ICD-10-CM | POA: Insufficient documentation

## 2017-08-08 ENCOUNTER — Other Ambulatory Visit (INDEPENDENT_AMBULATORY_CARE_PROVIDER_SITE_OTHER): Payer: 59

## 2017-08-08 DIAGNOSIS — Z1211 Encounter for screening for malignant neoplasm of colon: Secondary | ICD-10-CM | POA: Diagnosis not present

## 2017-08-08 LAB — HEMOCCULT GUIAC POC 1CARD (OFFICE)
Card #2 Fecal Occult Blod, POC: NEGATIVE
Card #3 Fecal Occult Blood, POC: NEGATIVE
Fecal Occult Blood, POC: NEGATIVE

## 2017-08-22 ENCOUNTER — Other Ambulatory Visit: Payer: Self-pay | Admitting: Nurse Practitioner

## 2017-08-22 DIAGNOSIS — F419 Anxiety disorder, unspecified: Secondary | ICD-10-CM

## 2017-09-09 ENCOUNTER — Ambulatory Visit: Payer: BLUE CROSS/BLUE SHIELD | Attending: Neurology

## 2017-09-09 ENCOUNTER — Encounter: Payer: Self-pay | Admitting: Obstetrics and Gynecology

## 2017-09-09 ENCOUNTER — Ambulatory Visit (INDEPENDENT_AMBULATORY_CARE_PROVIDER_SITE_OTHER): Payer: 59 | Admitting: Obstetrics and Gynecology

## 2017-09-09 VITALS — BP 117/76 | HR 85 | Ht 65.5 in | Wt 127.9 lb

## 2017-09-09 DIAGNOSIS — R0683 Snoring: Secondary | ICD-10-CM | POA: Diagnosis not present

## 2017-09-09 DIAGNOSIS — Z7689 Persons encountering health services in other specified circumstances: Secondary | ICD-10-CM

## 2017-09-09 DIAGNOSIS — N951 Menopausal and female climacteric states: Secondary | ICD-10-CM | POA: Diagnosis not present

## 2017-09-09 DIAGNOSIS — N9089 Other specified noninflammatory disorders of vulva and perineum: Secondary | ICD-10-CM

## 2017-09-09 DIAGNOSIS — N952 Postmenopausal atrophic vaginitis: Secondary | ICD-10-CM | POA: Diagnosis not present

## 2017-09-09 DIAGNOSIS — Z124 Encounter for screening for malignant neoplasm of cervix: Secondary | ICD-10-CM

## 2017-09-09 DIAGNOSIS — G4733 Obstructive sleep apnea (adult) (pediatric): Secondary | ICD-10-CM | POA: Diagnosis present

## 2017-09-09 NOTE — Progress Notes (Signed)
GYNECOLOGY CLINIC PROGRESS NOTE Subjective:     Denise Elliott is a 56 y.o. [phone] postmenopausal (x 8 years) female here for further evaluation and management of a vulvar mass.  Patient notes that she has had a small "pea-sized" like mass that has occurred in the vulvar region approximately 2-3 times.  Last episode was in July, which she was seen by Urology.  Notes lump appeared just right of the clitoris, which was tender and itchy for ~ 3 days, then resolved without any further intervention.  The episode prior to this was in March, where it was located in the periurethral region. Denies abnormal vaginal discharge, h/o STDs.  Denies dyspareunia.   In addition, patient notes that she experiences "twinges" of her bladder like she is about to develop a UTI, however notes that each time her workup has been negative for infection.  Also notes these occasional twinges at the vaginal introitus.   Lastly, patient desires to establish care as her PCP instructed her to do so for further management of her HRT. Patient is currently on Prempro, and has been on this medication over the past 2.5 years.  States that each year she has been able to decrease her dose slightly.    Gynecologic History No LMP recorded. Patient is postmenopausal. Contraception: post menopausal status Last Pap: ~ 2 years ago. Results were: normal Last mammogram: 07/22/2017. Results were: abnormal screening (BIRADS 0, possible mass in right breast).  Followed by diagnostic ultrasound and mammogram which were normal.   Obstetric History OB History  Gravida Para Term Preterm AB Living  5 3 2   2 3   SAB TAB Ectopic Multiple Live Births  2       3    # Outcome Date GA Lbr Len/2nd Weight Sex Delivery Anes PTL Lv  5 Term      Vag-Spont   LIV  4 Term      Vag-Spont   LIV  3 Para      Vag-Spont   LIV  2 SAB      SAB     1 SAB      SAB          Past Medical History:  Diagnosis Date  . Allergy   . Anxiety   . Arthritis   .  Cervical dystonia   . Depression   . Fibromyalgia   . Fibromyalgia   . GERD (gastroesophageal reflux disease)   . Glaucoma    closed angle glaucoma both eyes, laser treatment in past  . Goiter   . HLD (hyperlipidemia)    statin intolerance  . Hypertension   . 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 Sleep apnea   . Sleep apnea   . Sleep apnea     Family History  Problem Relation Age of Onset  . Colon polyps Mother   . Hypertension Mother   . Goiter Mother   . AAA (abdominal aortic aneurysm) Father   . Alcohol abuse Father   . Healthy Sister   . Healthy Daughter   . Healthy Son   . Colon cancer Maternal Uncle   . Vision loss Maternal Grandmother   . Liver cancer Paternal Grandfather   . Alcohol abuse Paternal Grandfather   . Healthy Sister   . Healthy Sister   . Healthy Son   . Bladder Cancer Neg Hx   . Kidney cancer Neg Hx   . Prostate cancer Neg Hx   .  Breast cancer Neg Hx     Past Surgical History:  Procedure Laterality Date  . CHOLECYSTECTOMY    . COSMETIC SURGERY Left    cheek bone  . GALLBLADDER SURGERY    . TONSILLECTOMY    . TONSILLECTOMY      Social History   Social History  . Marital status: Married    Spouse name: N/A  . Number of children: N/A  . Years of education: N/A   Occupational History  . Not on file.   Social History Main Topics  . Smoking status: Never Smoker  . Smokeless tobacco: Never Used  . Alcohol use Yes     Comment: socially  . Drug use: No  . Sexual activity: Yes    Birth control/ protection: Post-menopausal   Other Topics Concern  . Not on file   Social History Narrative  . No narrative on file    Current Outpatient Prescriptions on File Prior to Visit  Medication Sig Dispense Refill  . busPIRone (BUSPAR) 5 MG tablet TAKE 1 TABLET BY MOUTH TWICE A DAY 60 tablet 1  . estrogen, conjugated,-medroxyprogesterone (PREMPRO) 0.45-1.5 MG tablet Take 1 tablet by mouth daily. 90  tablet 1  . fluticasone (FLONASE) 50 MCG/ACT nasal spray Place into the nose.    . hydrochlorothiazide (MICROZIDE) 12.5 MG capsule Take 1 capsule (12.5 mg total) by mouth daily. 30 capsule 2  . lisinopril (PRINIVIL,ZESTRIL) 10 MG tablet Take 1 tablet (10 mg total) by mouth daily. 30 tablet 2  . mirtazapine (REMERON) 7.5 MG tablet Take 7.5 mg by mouth at bedtime.  5  . Multiple Vitamin (MULTIVITAMIN) tablet Take 1 tablet by mouth daily.    . Omega-3 Fatty Acids (FISH OIL) 1000 MG CAPS Take 1 capsule by mouth.    Marland Kitchen omeprazole (PRILOSEC) 20 MG capsule Take 1 capsule (20 mg total) by mouth daily. 90 capsule 1  . Probiotic Product (PROBIOTIC-10) CAPS Take by mouth.    . triamcinolone ointment (KENALOG) 0.1 % Apply topically.     No current facility-administered medications on file prior to visit.     Allergies  Allergen Reactions  . Versed [Midazolam] Hives and Other (See Comments)    BP increased unable to talk BP increased unable to hear     Review of Systems Pertinent items noted in HPI and remainder of comprehensive ROS otherwise negative.    Objective:    BP 117/76 (BP Location: Left Arm, Patient Position: Sitting, Cuff Size: Normal)   Pulse 85   Ht 5' 5.5" (1.664 m)   Wt 127 lb 14.4 oz (58 kg)   BMI 20.96 kg/m  General appearance: alert and no distress Abdomen: soft, non-tender; bowel sounds normal; no masses,  no organomegaly Pelvic: external genitalia normal, rectovaginal septum normal.  Vagina without discharge, introitus with mild atrophy however vaginal mucosa otherwise normal appearing.  Cervix normal appearing, no lesions and no motion tenderness.  Uterus mobile, nontender, normal shape and size.  Adnexae non-palpable, nontender bilaterally.  Extremities: extremities normal, atraumatic, no cyanosis or edema Neurologic: Grossly normal    Assessment:   H/o vulvar lesion Mild vaginal atrophy Encounter to establish care Menopausal vasomotor syndrome  Cervical cancer  screening  Plan:   - H/o vulvar lesion x 2 episode, usually self-limiting.  No lesions seen on exam today. Advised to return for evaluation if lesion appears again.  - Mild vaginal atrophy of introitus and urethra noted on exam today. Advised on treatment with a small pea-sized amount of estrogen  cream to the regions. Given samples. May also help her "twinges". - Hormone replacement therapy: hormone replacement therapy: Prempro. Patient has been using for 2.5 years, steadily decreasing dose. Has no other risk factors. Will take over management.  Patient to call once refill needed. - Patient requests pap smear as she is concerned that something may be going on vaginally since she also experiences twinges there. Discussed that her pap smear was currently up to date and guideline recommendations. Patient still desires pap today. Will perform.  - Follow up immediately once lesion appears again, otherwise, in 1-2 months to f/u atrophy.      Rubie Maid, MD [facility]

## 2017-09-10 MED ORDER — ESTROGENS, CONJUGATED 0.625 MG/GM VA CREA: 1.0000 | TOPICAL_CREAM | VAGINAL | 12 refills | Status: DC

## 2017-09-11 LAB — PAP IG AND HPV HIGH-RISK
HPV, high-risk: NEGATIVE
PAP Smear Comment: 0

## 2017-09-19 ENCOUNTER — Ambulatory Visit (INDEPENDENT_AMBULATORY_CARE_PROVIDER_SITE_OTHER): Payer: 59

## 2017-09-19 ENCOUNTER — Other Ambulatory Visit: Payer: Self-pay

## 2017-09-19 DIAGNOSIS — Z23 Encounter for immunization: Secondary | ICD-10-CM | POA: Diagnosis not present

## 2017-09-19 MED ORDER — LISINOPRIL 10 MG PO TABS: 10.0000 mg | ORAL_TABLET | Freq: Every day | ORAL | 4 refills | Status: DC

## 2017-09-24 ENCOUNTER — Encounter: Payer: Self-pay | Admitting: Obstetrics and Gynecology

## 2017-09-24 ENCOUNTER — Ambulatory Visit (INDEPENDENT_AMBULATORY_CARE_PROVIDER_SITE_OTHER): Payer: No Typology Code available for payment source | Admitting: Obstetrics and Gynecology

## 2017-09-24 VITALS — BP 117/69 | HR 73 | Ht 65.5 in | Wt 127.5 lb

## 2017-09-24 DIAGNOSIS — R399 Unspecified symptoms and signs involving the genitourinary system: Secondary | ICD-10-CM | POA: Diagnosis not present

## 2017-09-24 DIAGNOSIS — N9089 Other specified noninflammatory disorders of vulva and perineum: Secondary | ICD-10-CM | POA: Diagnosis not present

## 2017-09-24 LAB — POCT URINALYSIS DIPSTICK
Bilirubin, UA: NEGATIVE
Glucose, UA: NEGATIVE
Ketones, UA: NEGATIVE
Leukocytes, UA: NEGATIVE
Nitrite, UA: NEGATIVE
Protein, UA: NEGATIVE
Spec Grav, UA: 1.01 (ref 1.010–1.025)
Urobilinogen, UA: 0.2 E.U./dL
pH, UA: 6.5 (ref 5.0–8.0)

## 2017-09-24 NOTE — Progress Notes (Signed)
    GYNECOLOGY PROGRESS NOTE  Subjective:    Patient ID: Denise Elliott, female    DOB: 01/17/1961, 56 y.o.   MRN: [id number]  HPI  Patient is a 56 y.o. [phone] female who presents for swelling of clitoral region.  Patient notes that she noticed it last Thursday.  She reports an occurrence of a small "pea-sized" like mass that has occurred in the vulvar region near the clitoris or urethra approximately 3-4 times.  Last episode was in March 2018.    Patient also complaining of crampy sensation after urination, as well as abnormal color to her urine. Is concerned she may be getting a UTI.   The following portions of the patient's history were reviewed and updated as appropriate: allergies, current medications, past family history, past medical history, past social history, past surgical history and problem list.  Review of Systems Pertinent items noted in HPI and remainder of comprehensive ROS otherwise negative.   Objective:   Blood pressure 117/69, pulse 73, height 5' 5.5" (1.664 m), weight 127 lb 8 oz (57.8 kg). General appearance: alert and no distress Abdomen: soft, non-tender; bowel sounds normal; no masses,  no organomegaly  Back: symmetric, no curvature. ROM normal. No CVA tenderness. Pelvic: External genitalia normal, no lesions.  Rectovaginal septum normal.  Internal exam not performed.  Neuro: Grossly intact   Labs:  Results for orders placed or performed in visit on 09/24/17  POCT urinalysis dipstick  Result Value Ref Range   Color, UA yellow    Clarity, UA clear    Glucose, UA neg    Bilirubin, UA neg    Ketones, UA neg    Spec Grav, UA 1.010 1.010 - 1.025   Blood, UA trace    pH, UA 6.5 5.0 - 8.0   Protein, UA neg    Urobilinogen, UA 0.2 0.2 or 1.0 E.U./dL   Nitrite, UA neg    Leukocytes, UA Negative Negative    Assessment:   Clitoral swelling? (resolved) UTI symptoms  Plan:   - Patient has been seen twice due to similar complaint, however each time  that she comes to see a physician it is several days after the occurrence, and exam tends to be normal.  Advised on calling on same day that it occurs, so that she may possibly be worked in for a same-day appointment.  - Patient with UTI symptoms, UA today appears negative.  Advised on increasing hydration, taking Azo or consuming cranberry juice for symptoms.

## 2017-09-24 NOTE — Patient Instructions (Addendum)

## 2017-09-26 LAB — URINE CULTURE: Organism ID, Bacteria: NO GROWTH

## 2017-10-06 ENCOUNTER — Other Ambulatory Visit: Payer: Self-pay | Admitting: Nurse Practitioner

## 2017-10-28 ENCOUNTER — Other Ambulatory Visit: Payer: Self-pay | Admitting: Nurse Practitioner

## 2017-10-28 DIAGNOSIS — F419 Anxiety disorder, unspecified: Secondary | ICD-10-CM

## 2017-11-11 ENCOUNTER — Ambulatory Visit (INDEPENDENT_AMBULATORY_CARE_PROVIDER_SITE_OTHER): Payer: 59 | Admitting: Obstetrics and Gynecology

## 2017-11-11 ENCOUNTER — Encounter: Payer: Self-pay | Admitting: Obstetrics and Gynecology

## 2017-11-11 VITALS — BP 121/77 | HR 75 | Ht 65.5 in | Wt 125.1 lb

## 2017-11-11 DIAGNOSIS — N951 Menopausal and female climacteric states: Secondary | ICD-10-CM

## 2017-11-11 DIAGNOSIS — N952 Postmenopausal atrophic vaginitis: Secondary | ICD-10-CM

## 2017-11-11 MED ORDER — CONJ ESTROG-MEDROXYPROGEST ACE 0.45-1.5 MG PO TABS: 1.0000 | ORAL_TABLET | Freq: Every day | ORAL | 3 refills | Status: DC

## 2017-11-11 NOTE — Progress Notes (Signed)
    GYNECOLOGY PROGRESS NOTE  Subjective:    Patient ID: Denise Elliott, female    DOB: 07/11/1961, 56 y.o.   MRN: [id number]  HPI  Patient is a 56 y.o. [phone] female who presents for 2 month f/u of vaginal atrophy.  Patient was using Premarin cream as recommended.  Notes improvement in vaginal symptoms overall.  Has not noted any more swelling. Has now decreased dosing to just as needed.   Of note, patient desires refill on Prempro.  Was previously getting it from her PCP, but notes that they recommended that she begin to get it from her  GYN now that she is established.  Has been on hormones for 2 years for vasomotor symptoms. Lowered dose 1 year ago.    The following portions of the patient's history were reviewed and updated as appropriate: allergies, current medications, past family history, past medical history, past social history, past surgical history and problem list.  Review of Systems Pertinent items noted in HPI and remainder of comprehensive ROS otherwise negative.   Objective:   Blood pressure 121/77, pulse 75, height 5' 5.5" (1.664 m), weight 125 lb 1.6 oz (56.7 kg). General appearance: alert and no distress Exam deferred today.     Assessment:   Vaginal atrophy Menopausal vasomotor symptoms  Plan:   - Vaginal atrophy, treated with Premarin cream x 2 months, now just using as needed.   To f/u if symptoms worsen.  - Refill on Prempro given today. This woman has a 2 year history of HRT exposure with new study data showing increased risk of thrombo-embolic events such as myocardial infarction stroke and breast cancer after 4 or more years exposure to combination products with estrogen and progesterone. Understands the benefits as well as the risks. She  is advised that she may wish to discontinue the HRT at any time, and encouraged to discuss this with her other physicians also. Her personal risk factors have been reviewed carefully with her today.    Rubie Maid,  MD [facility]

## 2017-12-03 ENCOUNTER — Ambulatory Visit (INDEPENDENT_AMBULATORY_CARE_PROVIDER_SITE_OTHER): Payer: No Typology Code available for payment source | Admitting: Nurse Practitioner

## 2017-12-03 ENCOUNTER — Other Ambulatory Visit: Payer: Self-pay

## 2017-12-03 VITALS — BP 114/78 | HR 77 | Temp 97.6°F | Ht 65.5 in | Wt 122.8 lb

## 2017-12-03 DIAGNOSIS — F419 Anxiety disorder, unspecified: Secondary | ICD-10-CM | POA: Diagnosis not present

## 2017-12-03 MED ORDER — MIRTAZAPINE 15 MG PO TABS: 15.0000 mg | ORAL_TABLET | Freq: Every day | ORAL | 5 refills | Status: DC

## 2017-12-03 NOTE — Patient Instructions (Addendum)
Denise Elliott, Thank you for coming in to clinic today.  1. STOP buspirone - Reduce to one tablet once daily for 7 days, then stop. - can take 1/2 tablet if having headaches for an additional 1 week.  2. Increase mirtazapine to 15 mg once daily.  Please schedule a follow-up appointment with [name], AGNP. Return 1-2 weeks if symptoms worsen or fail to improve.  If you have any other questions or concerns, please feel free to call the clinic or send a message through [place]. You may also schedule an earlier appointment if necessary.  You will receive a survey after today's visit either digitally by e-mail or paper by [employer]. Your experiences and feedback matter to [place].  Please respond so we know how we are doing as we provide care for you.   [name], DNP, AGNP-BC Adult Gerontology Nurse Practitioner [place]

## 2017-12-03 NOTE — Progress Notes (Signed)
Subjective:    Patient ID: Denise Elliott, female    DOB: [birth year], 56 y.o.   MRN: [id number]  Denise Elliott is a 56 y.o. female presenting on 12/03/2017 for Fatigue (lethargic, weak, intermittent dizziness and wiggly sensation around the crown of the head x 50mths. Pt states the symptoms are worse after eating.)   HPI Fatigue Symptoms started about 2 months ago, but is worsening over time.  Pt doesn't feel comfortable driving today because of feeling lightheaded, dizzy, lethargic, weak - worst after eating.  "I feel drugged." No shower last night as is usual routine. - Pt also reports a "Wiggly feeling" in right side of neck over last 2 weeks. - Pt notes onset of symptoms after starting buspirone.  She is taking Buspirone 5 mg - is helping some with anxiety, but is possibly causing increased side effects.  Pt has fibromyalgia and cervical dystonia as well which could be causing some of the paresthesias she is having.  Social History   Tobacco Use  . Smoking status: Never Smoker  . Smokeless tobacco: Never Used  Substance Use Topics  . Alcohol use: Yes    Comment: socially  . Drug use: No    Review of Systems Per HPI unless specifically indicated above     Objective:    BP 114/78 (BP Location: Right Arm, Patient Position: Sitting, Cuff Size: Small)   Pulse 77   Temp 97.6 F (36.4 C) (Oral)   Ht 5' 5.5" (1.664 m)   Wt 122 lb 12.8 oz (55.7 kg)   BMI 20.12 kg/m    Wt Readings from Last 3 Encounters:  12/03/17 122 lb 12.8 oz (55.7 kg)  11/11/17 125 lb 1.6 oz (56.7 kg)  09/24/17 127 lb 8 oz (57.8 kg)    Physical Exam  General - healthy, well-appearing, NAD HEENT - Normocephalic, atraumatic Neck - supple, tender paraspinal and trapezius muscles, no LAD, no thyromegaly, no carotid bruit Heart - RRR, no murmurs heard Lungs - Clear throughout all lobes, no wheezing, crackles, or rhonchi. Normal work of breathing. Extremeties - non-tender, no edema, cap refill < 2  seconds, peripheral pulses intact +2 bilaterally Skin - warm, dry Neuro - awake, alert, oriented x3, CN II-X intact, intact muscle strength 5/5 bilaterally, intact distal sensation to light touch, normal coordination, normal gait Psych - Normal mood and affect, normal behavior   Results for orders placed or performed in visit on 09/24/17  Urine Culture  Result Value Ref Range   Urine Culture, Routine Final report    Organism ID, Bacteria No growth   POCT urinalysis dipstick  Result Value Ref Range   Color, UA yellow    Clarity, UA clear    Glucose, UA neg    Bilirubin, UA neg    Ketones, UA neg    Spec Grav, UA 1.010 1.010 - 1.025   Blood, UA trace    pH, UA 6.5 5.0 - 8.0   Protein, UA neg    Urobilinogen, UA 0.2 0.2 or 1.0 E.U./dL   Nitrite, UA neg    Leukocytes, UA Negative Negative      Assessment & Plan:   Problem List Items Addressed This Visit      Other   Anxiety - Primary    Chronic anxiety with some improvement on buspirone, but possible additional side effect of fatigue and paresthesias.  Pt taking mirtazepine 7.5 mg at bedtime to assist w/ sleep and cervical dystonia.  Started on citalopram on May 19 2017 and has not noticed any positive effect. Is not willing to try higher dose.   Plan: 1. STOP buspirone - Reduce to one tablet once daily for 7 days, then stop. - can take 1/2 tablet if having headaches for an additional 1 week. 2. Increase mirtazapine to 15 mg once daily for anxiety. 3. Recommend counseling for coping w/ trauma.  Work on other Careers information officer. 4. Follow up as previously scheduled and in 1-2 weeks prn persistent symptoms       Relevant Medications   mirtazapine (REMERON) 15 MG tablet      Meds ordered this encounter  Medications  . mirtazapine (REMERON) 15 MG tablet    Sig: Take 1 tablet (15 mg total) by mouth at bedtime.    Dispense:  30 tablet    Refill:  5    Order Specific Question:   Supervising Provider    Answer:    Olin Hauser [[id number]]      Follow up plan: Return 1-2 weeks if symptoms worsen or fail to improve.  A total of 25 minutes was spent face-to-face with this patient. Greater than 50% of this time was spent in counseling and coordination of care with the patient regarding current symptoms and possible medications.  [name], DNP, AGPCNP-BC Adult Gerontology Primary Care Nurse Practitioner [facility] 12/14/2017, 5:49 PM

## 2017-12-14 ENCOUNTER — Encounter: Payer: Self-pay | Admitting: Nurse Practitioner

## 2017-12-14 NOTE — Assessment & Plan Note (Signed)
Chronic anxiety with some improvement on buspirone, but possible additional side effect of fatigue and paresthesias.  Pt taking mirtazepine 7.5 mg at bedtime to assist w/ sleep and cervical dystonia.  Started on citalopram on May 19 2017 and has not noticed any positive effect. Is not willing to try higher dose.   Plan: 1. STOP buspirone - Reduce to one tablet once daily for 7 days, then stop. - can take 1/2 tablet if having headaches for an additional 1 week. 2. Increase mirtazapine to 15 mg once daily for anxiety. 3. Recommend counseling for coping w/ trauma.  Work on other Careers information officer. 4. Follow up as previously scheduled and in 1-2 weeks prn persistent symptoms

## 2017-12-29 ENCOUNTER — Encounter: Payer: Self-pay | Admitting: Nurse Practitioner

## 2017-12-29 ENCOUNTER — Other Ambulatory Visit: Payer: Self-pay

## 2017-12-29 ENCOUNTER — Ambulatory Visit (INDEPENDENT_AMBULATORY_CARE_PROVIDER_SITE_OTHER): Payer: No Typology Code available for payment source | Admitting: Nurse Practitioner

## 2017-12-29 VITALS — BP 117/72 | HR 78 | Ht 65.5 in | Wt 121.2 lb

## 2017-12-29 DIAGNOSIS — R5382 Chronic fatigue, unspecified: Secondary | ICD-10-CM

## 2017-12-29 DIAGNOSIS — I10 Essential (primary) hypertension: Secondary | ICD-10-CM

## 2017-12-29 DIAGNOSIS — F419 Anxiety disorder, unspecified: Secondary | ICD-10-CM | POA: Diagnosis not present

## 2017-12-29 DIAGNOSIS — E782 Mixed hyperlipidemia: Secondary | ICD-10-CM | POA: Diagnosis not present

## 2017-12-29 MED ORDER — MIRTAZAPINE 7.5 MG PO TABS: 7.5000 mg | ORAL_TABLET | Freq: Every day | ORAL | 5 refills | Status: DC

## 2017-12-29 NOTE — Assessment & Plan Note (Signed)
Pt w/ ongoing work on lifestyle, but prior check of lipids indicates mixed dyslipidemia.  Pt previously has tried multiple statins w/ failures 2/2 myalgias at another medical facility.  - Started fish oil 1,000 mg once daily (misses doses frequently)   Plan: 1. Continue your fish oil 1,000 mg once daily.  Consider changing to Vascepa 90-day supply at 4g daily total dose if persistently elevated LDL and triglycerides. 2. Improve your diet to adhere to low glycemic diet. 3. Continue daily exercise. 4. Follow up 6 months. - Consider PCSK-9 inhibitors in future if no improtement.  Pt continues to decline additional medication.

## 2017-12-29 NOTE — Assessment & Plan Note (Signed)
Improved with currently controlled hypertension.  BP at goal.  Pt is also continuing work on lifestyle modifications.  Taking medications tolerating well without side effects.   Plan: 1. Continue taking lisinopril 10 mg once daily and hydrochlorothiazide 12.5 mg once daily 2. Obtain labs CMP, Lipid, TSH w/in next 7-14 days  3. Encouraged heart healthy diet and increasing exercise to 30 minutes most days of the week. 4. Check BP 1-2 x per week at home, keep log, and bring to clinic at next appointment. 5. Follow up 3 months.

## 2017-12-29 NOTE — Assessment & Plan Note (Signed)
Chronic anxiety with some improvement on buspirone was stopped at last visit r/t possible side effects of fatigue and paresthesias.  Pt was increased to mirtazapine 15 mg once daily, but has not had resolution of dizziness or paresthesias.   Plan: 1. Reduce dose mirtazapine to prior dose of 7.5 mg once daily for anxiety. 2. Pt currently declines gabapentin/lyrica as has taken with side effects in past. 3. Recommend counseling for coping since medications are not helping symptoms and are causing side effects at low doses.  Work on Electrical engineer. - Pt requests pastor's wife for counseling.  Discussed may be beneficial, but should also consider CBT for specific work on psychology aspects of anxiety. 4. Follow up 3 months and as needed if worsening of symptoms.

## 2017-12-29 NOTE — Patient Instructions (Addendum)
Kairi, Thank you for coming in to clinic today.  1. For your dizziness: - Try returning to your lower dose of mirtazapine.  Take 7.5 mg once daily.  2. For your anxiety: - I recommend counseling to work on stress management, cognitive behavioral therapy. Self Referral: 1. [facility]   Address: [address], [place], [place] [address] Hours: Open today  9AM-7PM Phone: [phone]  2. [place] [place] Address: [address] [place], [place] [address] Phone: [phone]    Cognitive Behavioral Therapy  Cognitive behavioral therapy (CBT) is a short-term, goal-oriented type of talk therapy. CBT can help you:  Identify patterns of thinking, feeling, and behaving that are causing you problems.  Decide how you want to think, feel, and respond to life events.  Set goals to change the beliefs and thoughts that cause you to act in ways that are not helpful for you.  Follow up on the changes that you make.  What are the different types of CBT? The different types of CBT include:  Dialectical behavioral therapy (DBT). This approach is often used in group therapy, and it aids a person in managing behavior by focusing on: ? Things that cause problems to start (triggers). ? Methods of self-calming. ? Re-evaluating thinking processes.  Mindfulness-based cognitive therapy. This approach involves focusing your attention, meditating, and developing awareness of the present moment (mindfulness).  Rational emotive behavior therapy. This approach uses rational thought to reframe your thinking so it is less judgmental. Your therapist may directly challenge your thought processes.  Stress inoculation training. This approach involves planning ahead for stressful situations by practicing new thoughts and behaviors. This planning can help you avoid going back to old actions.  Acceptance and commitment therapy (ACT). This  approach focuses on accepting yourself as you are and practicing mindfulness. It helps you understand what you would like to change and how you can set goals in that direction.  What conditions is CBT used to treat? CBT may help to treat:  Mental health conditions, including: ? Depression. ? Anxiety. ? Bipolar disorder. ? Eating disorders. ? Post-traumatic stress disorder (PTSD). ? Obsessive-compulsive disorder (OCD).  Insomnia and other sleep disorders.  Pain.  Stress.  Coping with loss or grief.  Coping with a difficult medical diagnosis or illness.  Relationship problems.  Emotional distress or shock (trauma).  How can CBT help me? CBT may:  Give you a chance to share your thoughts, feelings, problems, and fears in a safe space.  Help you focus on specific problems.  Give you homework that helps you put theory into practice. Homework may include keeping a journal or doing thinking exercises.  Help you become aware of your patterns of thinking, feeling, and behaving, and how those three patterns affect each other.  Change your thoughts so that you can change your behaviors.  Help you chose how you want to view the world.  Teach you planned coping skills and offer better ways to deal with stress and difficult situations.  To make the most of CBT, make sure you:  Find a licensed therapist whom you trust.  Take an active part in your therapy and do the homework that you are given.  Are honest about your problems.  Avoid skipping your therapy sessions.  Summary  Cognitive behavioral therapy (CBT) is a short-term, goal-oriented type of talk therapy.  CBT can help you become aware of your patterns and  the relationships among your thoughts, feelings, and behavior.  CBT may help mental health conditions and other problems. This information is not intended to replace advice given to you by your health care provider. Make sure you discuss any questions you have  with your health care provider. Document Released: 04/15/2017 Document Revised: 04/15/2017 Document Reviewed: 04/15/2017 Elsevier Interactive Patient Education  2018 [place].  Please schedule a follow-up appointment with [name], AGNP. Return in about 3 months (around 03/29/2018) for anxiety and fibromyalgia.  If you have any other questions or concerns, please feel free to call the clinic or send a message through [facility]. You may also schedule an earlier appointment if necessary.  You will receive a survey after today's visit either digitally by e-mail or paper by [employer]. Your experiences and feedback matter to [place].  Please respond so we know how we are doing as we provide care for you.   [name], DNP, AGNP-BC Adult Gerontology Nurse Practitioner [facility]

## 2017-12-29 NOTE — Progress Notes (Signed)
Subjective:    Patient ID: Denise Elliott, female    DOB: [birth year], 59 y.o.   MRN: [id number]  Denise Elliott is a 57 y.o. female presenting on 12/29/2017 for Panic Attack (dizziness, disoriented, blurry vision, mostly at night w/ driven.)   HPI Anxiety Anxiety is about the same, but life stress increased.  Pt states her husband has told her she is "kind of controlling."  Patient reports similar manageable anxiety symptoms without much change, but also notes increased life stress with recent move.  This is patient's final move into her new permanent home and should provide stability in future.  Increased dose of mirtazapine did not help with anxiety symptoms.  Pt has new symptom of dizziness since increasing dose and possible side effect of higher dose mirtazapine.  Pt has had history of vertigo as well, but has not had any interval improvement in dizziness. Pt describes the sensation as being "woozy" and "not real clear" (a sort of brain fog) and worsens as day goes on.  Dystonia is also a little worse, so pt is unsure of what to attribute as cause of her symptoms. - Pt also has some associated fatigue.  Likely combination of multiple chronic diseases, but pt requests TSH check today.    Hypertension - She is not checking BP at home or outside of clinic.    - Current medications: hydrochlorothiazide 12.5mg  once daily, lisinopril 10 mg once daily, tolerating well without side effects - She is symptomatic with dizziness. - Pt denies headache, changes in vision, chest tightness/pressure, palpitations, leg swelling, sudden loss of speech or loss of consciousness. - She  reports no regular exercise routine, but stays active with housework and recently active with moving into new house. - Her diet is moderate in salt, moderate in fat, and moderate in carbohydrates.  She is avoiding fried foods, saturated fats, sweets.  She is eating a diet high in vegetables and is eating higher fiber  carbs  Cholesterol Pt is currently taking fish oil tablets intermittently.  She has not and does not desire to start medications for cholesterol management. - Pt denies changes in vision, chest tightness/pressure, palpitations, shortness of breath, leg pain while walking, leg or arm weakness, and sudden loss of speech or loss of consciousness. - Lifestyle modifications in process (See above)  Social History   Tobacco Use  . Smoking status: Never Smoker  . Smokeless tobacco: Never Used  Substance Use Topics  . Alcohol use: Yes    Comment: socially  . Drug use: No    Review of Systems Per HPI unless specifically indicated above     Objective:    BP 117/72 (BP Location: Right Arm, Patient Position: Sitting, Cuff Size: Normal)   Pulse 78   Ht 5' 5.5" (1.664 m)   Wt 121 lb 3.2 oz (55 kg)   BMI 19.86 kg/m   Wt Readings from Last 3 Encounters:  12/29/17 121 lb 3.2 oz (55 kg)  12/03/17 122 lb 12.8 oz (55.7 kg)  11/11/17 125 lb 1.6 oz (56.7 kg)    Physical Exam  General - healthy, well-appearing, NAD HEENT - Normocephalic, atraumatic Neck - supple, non-tender, no LAD, no thyromegaly, no carotid bruit, muscles with hypertonicity Heart - RRR, no murmurs heard Lungs - Clear throughout all lobes, no wheezing, crackles, or rhonchi. Normal work of breathing. Abdomen - soft, NTND, no masses, no hepatosplenomegaly, active bowel sounds Extremeties - non-tender, no edema, cap refill < 2 seconds, peripheral pulses intact +  2 bilaterally Skin - warm, dry Neuro - awake, alert, oriented x3, CNII-X intact, normal sensation to light and firm touch, normal gait Psych - Normal mood and affect, normal behavior   Results for orders placed or performed in visit on 09/24/17  Urine Culture  Result Value Ref Range   Urine Culture, Routine Final report    Organism ID, Bacteria No growth   POCT urinalysis dipstick  Result Value Ref Range   Color, UA yellow    Clarity, UA clear    Glucose, UA neg     Bilirubin, UA neg    Ketones, UA neg    Spec Grav, UA 1.010 1.010 - 1.025   Blood, UA trace    pH, UA 6.5 5.0 - 8.0   Protein, UA neg    Urobilinogen, UA 0.2 0.2 or 1.0 E.U./dL   Nitrite, UA neg    Leukocytes, UA Negative Negative      Assessment & Plan:   Problem List Items Addressed This Visit      Cardiovascular and Mediastinum   Hypertension - Primary    Improved with currently controlled hypertension.  BP at goal.  Pt is also continuing work on lifestyle modifications.  Taking medications tolerating well without side effects.   Plan: 1. Continue taking lisinopril 10 mg once daily and hydrochlorothiazide 12.5 mg once daily 2. Obtain labs CMP, Lipid, TSH w/in next 7-14 days  3. Encouraged heart healthy diet and increasing exercise to 30 minutes most days of the week. 4. Check BP 1-2 x per week at home, keep log, and bring to clinic at next appointment. 5. Follow up 3 months.        Relevant Orders   COMPLETE METABOLIC PANEL WITH GFR     Other   Anxiety    Chronic anxiety with some improvement on buspirone was stopped at last visit r/t possible side effects of fatigue and paresthesias.  Pt was increased to mirtazapine 15 mg once daily, but has not had resolution of dizziness or paresthesias.   Plan: 1. Reduce dose mirtazapine to prior dose of 7.5 mg once daily for anxiety. 2. Pt currently declines gabapentin/lyrica as has taken with side effects in past. 3. Recommend counseling for coping since medications are not helping symptoms and are causing side effects at low doses.  Work on Electrical engineer. - Pt requests pastor's wife for counseling.  Discussed may be beneficial, but should also consider CBT for specific work on psychology aspects of anxiety. 4. Follow up 3 months and as needed if worsening of symptoms.       Relevant Medications   mirtazapine (REMERON) 7.5 MG tablet   HLD (hyperlipidemia)    Pt w/ ongoing work on lifestyle, but prior check  of lipids indicates mixed dyslipidemia.  Pt previously has tried multiple statins w/ failures 2/2 myalgias at another medical facility.  - Started fish oil 1,000 mg once daily (misses doses frequently)   Plan: 1. Continue your fish oil 1,000 mg once daily.  Consider changing to Vascepa 90-day supply at 4g daily total dose if persistently elevated LDL and triglycerides. 2. Improve your diet to adhere to low glycemic diet. 3. Continue daily exercise. 4. Follow up 6 months. - Consider PCSK-9 inhibitors in future if no improtement.  Pt continues to decline additional medication.      Relevant Orders   Lipid panel   COMPLETE METABOLIC PANEL WITH GFR    Other Visit Diagnoses    Chronic fatigue  Acutely worsening with pt complaint of increased fatigue after meals.  Pt likely has fatigue secondary to increased stress, chronic fibromyalgia/cervical dystonia.  Unlikely caused by disruption of cortisol given normal physical exam, but could consider workup in future.  Plan: 1. Recheck labs  2. Encouraged pt to space meals out with more frequent smaller meals if fatigue only after meals. 3. Continue work to have regular physical activity and slowly increase to improve endurance. 4. Followup as needed after labs.   Relevant Orders   Lipid panel   COMPLETE METABOLIC PANEL WITH GFR   TSH      Meds ordered this encounter  Medications  . mirtazapine (REMERON) 7.5 MG tablet    Sig: Take 1 tablet (7.5 mg total) by mouth at bedtime.    Dispense:  30 tablet    Refill:  5    Order Specific Question:   Supervising Provider    Answer:   Olin Hauser [[id number]]    Follow up plan: Return in about 3 months (around 03/29/2018) for anxiety and fibromyalgia.  [name], DNP, AGPCNP-BC Adult Gerontology Primary Care Nurse Practitioner [facility] 12/29/2017, 11:38 AM

## 2017-12-30 ENCOUNTER — Other Ambulatory Visit: Payer: No Typology Code available for payment source

## 2017-12-31 LAB — COMPLETE METABOLIC PANEL WITH GFR
AG Ratio: 1.7 (calc) (ref 1.0–2.5)
ALT: 15 U/L (ref 6–29)
AST: 17 U/L (ref 10–35)
Albumin: 4.2 g/dL (ref 3.6–5.1)
Alkaline phosphatase (APISO): 59 U/L (ref 33–130)
BUN: 12 mg/dL (ref 7–25)
CO2: 29 mmol/L (ref 20–32)
Calcium: 9.3 mg/dL (ref 8.6–10.4)
Chloride: 100 mmol/L (ref 98–110)
Creat: 0.85 mg/dL (ref 0.50–1.05)
GFR, Est African American: 88 mL/min/{1.73_m2} (ref >=60)
GFR, Est Non African American: 76 mL/min/{1.73_m2} (ref >=60)
Globulin: 2.5 g/dL (calc) (ref 1.9–3.7)
Glucose, Bld: 92 mg/dL (ref 65–99)
Potassium: 3.7 mmol/L (ref 3.5–5.3)
Sodium: 138 mmol/L (ref 135–146)
Total Bilirubin: 0.4 mg/dL (ref 0.2–1.2)
Total Protein: 6.7 g/dL (ref 6.1–8.1)

## 2017-12-31 LAB — TSH: TSH: 1.06 mIU/L (ref 0.40–4.50)

## 2017-12-31 LAB — LIPID PANEL
Cholesterol: 226 mg/dL — ABNORMAL HIGH (ref <200)
HDL: 62 mg/dL (ref >50)
LDL Cholesterol (Calc): 138 mg/dL (calc) — ABNORMAL HIGH
Non-HDL Cholesterol (Calc): 164 mg/dL (calc) — ABNORMAL HIGH (ref <130)
Total CHOL/HDL Ratio: 3.6 (calc) (ref <5.0)
Triglycerides: 137 mg/dL (ref <150)

## 2018-01-09 ENCOUNTER — Other Ambulatory Visit: Payer: Self-pay | Admitting: Nurse Practitioner

## 2018-01-09 DIAGNOSIS — K219 Gastro-esophageal reflux disease without esophagitis: Secondary | ICD-10-CM

## 2018-02-02 ENCOUNTER — Other Ambulatory Visit: Payer: Self-pay | Admitting: Nurse Practitioner

## 2018-02-02 DIAGNOSIS — G243 Spasmodic torticollis: Secondary | ICD-10-CM

## 2018-02-06 ENCOUNTER — Ambulatory Visit
Admission: RE | Admit: 2018-02-06 | Discharge: 2018-02-06 | Disposition: A | Discharge status: Home / Self Care | Payer: BLUE CROSS/BLUE SHIELD | Source: Ambulatory Visit | Attending: Nurse Practitioner | Admitting: Nurse Practitioner

## 2018-02-06 DIAGNOSIS — J320 Chronic maxillary sinusitis: Secondary | ICD-10-CM | POA: Diagnosis not present

## 2018-02-06 DIAGNOSIS — G243 Spasmodic torticollis: Secondary | ICD-10-CM | POA: Insufficient documentation

## 2018-02-06 MED ORDER — GADOBENATE DIMEGLUMINE 529 MG/ML IV SOLN
10.0000 mL | Freq: Once | INTRAVENOUS | Status: AC | PRN
Start: 2018-02-06 — End: 2018-02-06
  Administered 2018-02-06: 10 mL via INTRAVENOUS

## 2018-02-26 ENCOUNTER — Encounter: Payer: Self-pay | Admitting: Nurse Practitioner

## 2018-02-26 ENCOUNTER — Ambulatory Visit: Payer: No Typology Code available for payment source | Admitting: Nurse Practitioner

## 2018-02-26 ENCOUNTER — Other Ambulatory Visit: Payer: Self-pay

## 2018-02-26 VITALS — BP 127/84 | HR 79 | Temp 98.2°F | Ht 65.5 in | Wt 118.4 lb

## 2018-02-26 DIAGNOSIS — J321 Chronic frontal sinusitis: Secondary | ICD-10-CM | POA: Diagnosis not present

## 2018-02-26 DIAGNOSIS — F419 Anxiety disorder, unspecified: Secondary | ICD-10-CM

## 2018-02-26 DIAGNOSIS — K648 Other hemorrhoids: Secondary | ICD-10-CM | POA: Diagnosis not present

## 2018-02-26 DIAGNOSIS — R634 Abnormal weight loss: Secondary | ICD-10-CM

## 2018-02-26 MED ORDER — FLUTICASONE PROPIONATE 50 MCG/ACT NA SUSP: 2.0000 | Freq: Every day | NASAL | 5 refills | Status: DC

## 2018-02-26 MED ORDER — BUSPIRONE HCL 7.5 MG PO TABS: 7.5000 mg | ORAL_TABLET | Freq: Two times a day (BID) | ORAL | 5 refills | Status: DC

## 2018-02-26 MED ORDER — AMOXICILLIN 500 MG PO TABS: 500.0000 mg | ORAL_TABLET | Freq: Two times a day (BID) | ORAL | 0 refills | Status: AC

## 2018-02-26 NOTE — Patient Instructions (Addendum)
Aide, Thank you for coming in to clinic today.  1. You have a left maxillary sinus fluid collection that is possibly infected and worsening your disorientation and possibly worsening your tinnitus. - START taking amoxicillin 500 mg one tablet twice daily for 10 days.  Make sure to take all doses of your antibiotic. - While you are on an antibiotic, take a probiotic.  Antibiotics kill good and bad bacteria.  A probiotic helps to replace your good bacteria. Probiotic pills can be found over the counter.  One brand is Florastor, but you can use any brand you prefer.  You can also get good bacteria from foods.  These foods are yogurt, kefir, kombucha (1/4-1/2 cup), and fresh, refrigerated and uncooked sauerkraut. - Drink plenty of fluids.  - Start anti-histamine loratadine (Claritin)10mg  daily. - START Flonase 2 sprays each nostril daily for up to 4-6 weeks  Other over the counter medications you may try, if needed for symptoms are: - If congestion is worse, start OTC Mucinex up to 7-10 days then stop.  2. Your incontinence is likely from reduced pelvic floor tone.  3. Resume buspar.  Take 7.5 mg tablet once daily for 1 week, then take 1 tablet twice daily and continue.  Please schedule a follow-up appointment with [name], AGNP. Return in about 3 months (around 05/29/2018), or sooner for unresolved symptoms, for disorientation, anxiety.  If you have any other questions or concerns, please feel free to call the clinic or send a message through [facility]. You may also schedule an earlier appointment if necessary.  You will receive a survey after today's visit either digitally by e-mail or paper by [employer]. Your experiences and feedback matter to [place].  Please respond so we know how we are doing as we provide care for you.   [name], DNP, AGNP-BC Adult Gerontology Nurse Practitioner [facility], [facility]   Kegel Exercises Kegel exercises help strengthen the muscles  that support the rectum, vagina, small intestine, bladder, and uterus. Doing Kegel exercises can help:  Improve bladder and bowel control.  Improve sexual response.  Reduce problems and discomfort during pregnancy.  Kegel exercises involve squeezing your pelvic floor muscles, which are the same muscles you squeeze when you try to stop the flow of urine. The exercises can be done while sitting, standing, or lying down, but it is best to vary your position. Phase 1 exercises 1. Squeeze your pelvic floor muscles tight. You should feel a tight lift in your rectal area. If you are a female, you should also feel a tightness in your vaginal area. Keep your stomach, buttocks, and legs relaxed. 2. Hold the muscles tight for up to 10 seconds. 3. Relax your muscles. Repeat this exercise 50 times a day or as many times as told by your health care provider. Continue to do this exercise for at least 4-6 weeks or for as long as told by your health care provider. This information is not intended to replace advice given to you by your health care provider. Make sure you discuss any questions you have with your health care provider. Document Released: 11/18/2012 Document Revised: 07/27/2016 Document Reviewed: 10/22/2015 Elsevier Interactive Patient Education  [employer].

## 2018-02-26 NOTE — Progress Notes (Signed)
Subjective:    Patient ID: Denise Elliott, female    DOB: Aug 11, 1961, 56 y.o.   MRN: [id number]  Denise Elliott is a 57 y.o. female presenting on 02/26/2018 for Disoriented (disoriented )   HPI Disoriented More when watching movement on TV and in a car, moving screen/scrolling, people walking by.   - Not associated w/ nausea, - Has to close eyes and stop to reset. -Patient has completed neurology workup for evaluation of dizziness.  Head CT and MRIs were negative except for persistent, chronic left sinus fluid collection.  Incontinence Stool incontinence in am.  Once after breakfast and with gas.  Once after using the bathroom.  These incontinence episodes have not happened in the past and are new in the last 7 days.  Patient reports she has stopped turmeric/curcumin.  No change in symptoms. - Has been somewhat anxious.  Should consider resuming buspirone.  No change in dizziness after stopping it.  Social History   Tobacco Use  . Smoking status: Never Smoker  . Smokeless tobacco: Never Used  Substance Use Topics  . Alcohol use: Yes    Comment: socially  . Drug use: No    Review of Systems Per HPI unless specifically indicated above     Objective:    BP 127/84 (BP Location: Right Arm, Patient Position: Sitting, Cuff Size: Normal)   Pulse 79   Temp 98.2 F (36.8 C) (Oral)   Ht 5' 5.5" (1.664 m)   Wt 118 lb 6.4 oz (53.7 kg)   BMI 19.40 kg/m   Wt Readings from Last 3 Encounters:  02/26/18 118 lb 6.4 oz (53.7 kg)  12/29/17 121 lb 3.2 oz (55 kg)  12/03/17 122 lb 12.8 oz (55.7 kg)    Physical Exam  Constitutional: She is oriented to person, place, and time. She appears well-developed and well-nourished. No distress.  HENT:  Head: Normocephalic and atraumatic.  Right Ear: Tympanic membrane, external ear and ear canal normal.  Left Ear: Tympanic membrane and external ear normal.  Nose: Nose normal. Right sinus exhibits no maxillary sinus tenderness and no frontal  sinus tenderness. Left sinus exhibits no maxillary sinus tenderness and no frontal sinus tenderness.  Mouth/Throat: Uvula is midline and mucous membranes are normal. No oropharyngeal exudate, posterior oropharyngeal edema or posterior oropharyngeal erythema.  Cardiovascular: Normal rate, regular rhythm, S1 normal, S2 normal, normal heart sounds and intact distal pulses.  No murmur heard. Pulmonary/Chest: Effort normal and breath sounds normal. No respiratory distress.  Abdominal: Soft. Bowel sounds are normal. She exhibits no distension. There is no hepatosplenomegaly. There is no tenderness. No hernia.  Neurological: She is alert and oriented to person, place, and time. No cranial nerve deficit. Coordination normal.  Skin: Skin is warm and dry.  Psychiatric: She has a normal mood and affect. Her behavior is normal. Judgment and thought content normal.  Vitals reviewed.    Results for orders placed or performed in visit on 12/29/17  Lipid panel  Result Value Ref Range   Cholesterol 226 (H) <200 mg/dL   HDL 62 >50 mg/dL   Triglycerides 137 <150 mg/dL   LDL Cholesterol (Calc) 138 (H) mg/dL (calc)   Total CHOL/HDL Ratio 3.6 <5.0 (calc)   Non-HDL Cholesterol (Calc) 164 (H) <130 mg/dL (calc)  COMPLETE METABOLIC PANEL WITH GFR  Result Value Ref Range   Glucose, Bld 92 65 - 99 mg/dL   BUN 12 7 - 25 mg/dL   Creat 0.85 0.50 - 1.05 mg/dL  GFR, Est Non African American 76 > OR = 60 mL/min/1.53m2   GFR, Est African American 88 > OR = 60 mL/min/1.69m2   BUN/Creatinine Ratio NOT APPLICABLE 6 - 22 (calc)   Sodium 138 135 - 146 mmol/L   Potassium 3.7 3.5 - 5.3 mmol/L   Chloride 100 98 - 110 mmol/L   CO2 29 20 - 32 mmol/L   Calcium 9.3 8.6 - 10.4 mg/dL   Total Protein 6.7 6.1 - 8.1 g/dL   Albumin 4.2 3.6 - 5.1 g/dL   Globulin 2.5 1.9 - 3.7 g/dL (calc)   AG Ratio 1.7 1.0 - 2.5 (calc)   Total Bilirubin 0.4 0.2 - 1.2 mg/dL   Alkaline phosphatase (APISO) 59 33 - 130 U/L   AST 17 10 - 35 U/L    ALT 15 6 - 29 U/L  TSH  Result Value Ref Range   TSH 1.06 0.40 - 4.50 mIU/L      Assessment & Plan:   Problem List Items Addressed This Visit      Other   Anxiety Recently worsening anxiety and depression.  Patient has been off buspirone and has not had any change in dizziness symptoms.  Plan: 1.  Restart buspirone 7.5 mg twice daily 2.  Continue stress management strategies as previously discussed. 3.  Consider counseling or therapy 4.  Follow-up in 3 months   Relevant Medications   busPIRone (BUSPAR) 7.5 MG tablet    Other Visit Diagnoses    Chronic frontal sinusitis    -  Primary Chronic frontal sinusitis of left maxillary sinus was identified on head MRI.  This fluid collection is possibly the cause of patient's disorientation and dizziness.  Dix-Hallpike and forward head tilt are negative today and all neuro exam is normal.  No clinical signs of infection are noted, but treatment of sinusitis may improve symptoms. Plan: 1.  Start amoxicillin 500 mg twice daily times 10 days. 2.  Start antihistamine Claritin 10 mg once daily 3.  Start Flonase 2 sprays each nostril for 4-6 weeks. 3.  Referral to ENT if this treatment plan does not improve symptoms. 4.  Follow-up as needed.   Relevant Medications   fluticasone (FLONASE) 50 MCG/ACT nasal spray   Internal hemorrhoid/incontinence of stool  Symptoms most likely from decreased pelvic floor tone.  Patient also has internal hemorrhoids that were likely prolapsed in recent weeks.  Internal hemorrhoid Impact ability for patient to remain continent especially during periods of prolapse.  Plan: 1.  Start Kegel exercises. 2.  Consider pelvic floor physical therapy 3.  Monitor symptoms and follow-up as needed.  Recent unexplained weight loss     Patient with unexplained recent weight loss noted previously identified lab abnormalities, but no check of TSH or hemoglobin A1c.  Check these values today, as they can be causes of weight  loss.    Relevant Orders   TSH + free T4 (Completed)   Hemoglobin A1c (Completed)      Meds ordered this encounter  Medications  . amoxicillin (AMOXIL) 500 MG tablet    Sig: Take 1 tablet (500 mg total) by mouth every 12 (twelve) hours for 10 days.    Dispense:  20 tablet    Refill:  0    Order Specific Question:   Supervising Provider    Answer:   Olin Hauser [[id number]]  . fluticasone (FLONASE) 50 MCG/ACT nasal spray    Sig: Place 2 sprays into both nostrils daily.    Dispense:  16  g    Refill:  5    Order Specific Question:   Supervising Provider    Answer:   Olin Hauser [[id number]]  . busPIRone (BUSPAR) 7.5 MG tablet    Sig: Take 1 tablet (7.5 mg total) by mouth 2 (two) times daily.    Dispense:  60 tablet    Refill:  5    Order Specific Question:   Supervising Provider    Answer:   Olin Hauser [[id number]]     Follow up plan: Return in about 3 months (around 05/29/2018), or sooner for unresolved symptoms, for disorientation, anxiety.   [name], DNP, AGPCNP-BC Adult Gerontology Primary Care Nurse Practitioner [facility] 02/26/2018, 10:14 AM

## 2018-02-27 LAB — TSH+FREE T4: TSH W/REFLEX TO FT4: 1.08 mIU/L (ref 0.40–4.50)

## 2018-02-27 LAB — HEMOGLOBIN A1C
Hgb A1c MFr Bld: 5.3 % of total Hgb (ref <5.7)
Mean Plasma Glucose: 105 (calc)
eAG (mmol/L): 5.8 (calc)

## 2018-03-13 ENCOUNTER — Telehealth: Payer: Self-pay | Admitting: Nurse Practitioner

## 2018-03-13 DIAGNOSIS — J320 Chronic maxillary sinusitis: Secondary | ICD-10-CM

## 2018-03-13 NOTE — Telephone Encounter (Signed)
We discussed that if the antibiotics did no help,  she would need referral to ENT for her chronic sinusitis and dizziness.  I have placed this referral today.

## 2018-03-13 NOTE — Telephone Encounter (Signed)
Pt states that she have taken all her antibiotics she states that it did not help . Requesting that you call something in to [facility]  [phone]

## 2018-03-15 ENCOUNTER — Encounter: Payer: Self-pay | Admitting: Nurse Practitioner

## 2018-04-09 ENCOUNTER — Other Ambulatory Visit: Payer: Self-pay | Admitting: Nurse Practitioner

## 2018-04-09 DIAGNOSIS — G243 Spasmodic torticollis: Secondary | ICD-10-CM

## 2018-04-10 ENCOUNTER — Other Ambulatory Visit: Payer: Self-pay | Admitting: Nurse Practitioner

## 2018-04-15 ENCOUNTER — Ambulatory Visit
Admission: RE | Admit: 2018-04-15 | Discharge: 2018-04-15 | Disposition: A | Discharge status: Home / Self Care | Payer: BLUE CROSS/BLUE SHIELD | Source: Ambulatory Visit | Attending: Nurse Practitioner | Admitting: Nurse Practitioner

## 2018-04-15 ENCOUNTER — Other Ambulatory Visit
Admission: RE | Admit: 2018-04-15 | Discharge: 2018-04-15 | Disposition: A | Discharge status: Home / Self Care | Payer: BLUE CROSS/BLUE SHIELD | Source: Ambulatory Visit | Attending: Nurse Practitioner | Admitting: Nurse Practitioner

## 2018-04-15 DIAGNOSIS — R42 Dizziness and giddiness: Secondary | ICD-10-CM | POA: Diagnosis present

## 2018-04-15 DIAGNOSIS — M4802 Spinal stenosis, cervical region: Secondary | ICD-10-CM | POA: Diagnosis not present

## 2018-04-15 DIAGNOSIS — Z79899 Other long term (current) drug therapy: Secondary | ICD-10-CM | POA: Insufficient documentation

## 2018-04-15 DIAGNOSIS — G243 Spasmodic torticollis: Secondary | ICD-10-CM | POA: Insufficient documentation

## 2018-04-15 DIAGNOSIS — M2578 Osteophyte, vertebrae: Secondary | ICD-10-CM | POA: Insufficient documentation

## 2018-04-15 LAB — COMPREHENSIVE METABOLIC PANEL
ALT: 20 U/L (ref 14–54)
AST: 21 U/L (ref 15–41)
Albumin: 3.9 g/dL (ref 3.5–5.0)
Alkaline Phosphatase: 45 U/L (ref 38–126)
Anion gap: 8 (ref 5–15)
BUN: 11 mg/dL (ref 6–20)
CO2: 29 mmol/L (ref 22–32)
Calcium: 8.9 mg/dL (ref 8.9–10.3)
Chloride: 100 mmol/L — ABNORMAL LOW (ref 101–111)
Creatinine, Ser: 0.77 mg/dL (ref 0.44–1.00)
GFR calc Af Amer: >60 mL/min (ref >60)
GFR calc non Af Amer: >60 mL/min (ref >60)
Glucose, Bld: 139 mg/dL — ABNORMAL HIGH (ref 65–99)
Potassium: 3.6 mmol/L (ref 3.5–5.1)
Sodium: 137 mmol/L (ref 135–145)
Total Bilirubin: 0.5 mg/dL (ref 0.3–1.2)
Total Protein: 6.6 g/dL (ref 6.5–8.1)

## 2018-04-15 MED ORDER — GADOBENATE DIMEGLUMINE 529 MG/ML IV SOLN
10.0000 mL | Freq: Once | INTRAVENOUS | Status: AC | PRN
  Administered 2018-04-15: 10 mL via INTRAVENOUS

## 2018-04-27 ENCOUNTER — Other Ambulatory Visit: Payer: Self-pay | Admitting: Nurse Practitioner

## 2018-04-27 DIAGNOSIS — I10 Essential (primary) hypertension: Secondary | ICD-10-CM

## 2018-04-27 MED ORDER — HYDROCHLOROTHIAZIDE 25 MG PO TABS: 25.0000 mg | ORAL_TABLET | Freq: Every day | ORAL | 5 refills | Status: DC

## 2018-04-30 ENCOUNTER — Telehealth: Payer: Self-pay

## 2018-04-30 NOTE — Telephone Encounter (Signed)
Inez Catalina called from [facility] to f/u if we received that fax note requesting to increase this pt HCTZ to control her dizziness. Lauren sent the prescription over to the pt pharmacy. Inez Catalina was notified, no question or concern.

## 2018-05-07 ENCOUNTER — Ambulatory Visit (INDEPENDENT_AMBULATORY_CARE_PROVIDER_SITE_OTHER): Payer: No Typology Code available for payment source | Admitting: Nurse Practitioner

## 2018-05-07 ENCOUNTER — Encounter: Payer: Self-pay | Admitting: *Deleted

## 2018-05-07 ENCOUNTER — Other Ambulatory Visit: Payer: Self-pay

## 2018-05-07 ENCOUNTER — Emergency Department: Payer: BLUE CROSS/BLUE SHIELD

## 2018-05-07 ENCOUNTER — Emergency Department
Admission: EM | Admit: 2018-05-07 | Discharge: 2018-05-07 | Disposition: A | Discharge status: Home / Self Care | Payer: BLUE CROSS/BLUE SHIELD | Attending: Emergency Medicine | Admitting: Emergency Medicine

## 2018-05-07 ENCOUNTER — Encounter: Payer: Self-pay | Admitting: Nurse Practitioner

## 2018-05-07 VITALS — BP 108/77 | HR 76 | Temp 98.0°F | Ht 65.5 in | Wt 115.4 lb

## 2018-05-07 DIAGNOSIS — K5909 Other constipation: Secondary | ICD-10-CM | POA: Diagnosis not present

## 2018-05-07 DIAGNOSIS — R109 Unspecified abdominal pain: Secondary | ICD-10-CM

## 2018-05-07 DIAGNOSIS — R110 Nausea: Secondary | ICD-10-CM | POA: Diagnosis not present

## 2018-05-07 DIAGNOSIS — I10 Essential (primary) hypertension: Secondary | ICD-10-CM | POA: Insufficient documentation

## 2018-05-07 DIAGNOSIS — I952 Hypotension due to drugs: Secondary | ICD-10-CM

## 2018-05-07 DIAGNOSIS — K5900 Constipation, unspecified: Secondary | ICD-10-CM | POA: Diagnosis not present

## 2018-05-07 DIAGNOSIS — R10827 Generalized rebound abdominal tenderness: Secondary | ICD-10-CM

## 2018-05-07 DIAGNOSIS — R1010 Upper abdominal pain, unspecified: Secondary | ICD-10-CM | POA: Diagnosis not present

## 2018-05-07 LAB — COMPREHENSIVE METABOLIC PANEL
ALT: 23 U/L (ref 14–54)
AST: 24 U/L (ref 15–41)
Albumin: 4 g/dL (ref 3.5–5.0)
Alkaline Phosphatase: 68 U/L (ref 38–126)
Anion gap: 9 (ref 5–15)
BUN: 8 mg/dL (ref 6–20)
CO2: 29 mmol/L (ref 22–32)
Calcium: 9.2 mg/dL (ref 8.9–10.3)
Chloride: 97 mmol/L — ABNORMAL LOW (ref 101–111)
Creatinine, Ser: 0.67 mg/dL (ref 0.44–1.00)
GFR calc Af Amer: >60 mL/min (ref >60)
GFR calc non Af Amer: >60 mL/min (ref >60)
Glucose, Bld: 95 mg/dL (ref 65–99)
Potassium: 4.2 mmol/L (ref 3.5–5.1)
Sodium: 135 mmol/L (ref 135–145)
Total Bilirubin: 0.8 mg/dL (ref 0.3–1.2)
Total Protein: 7 g/dL (ref 6.5–8.1)

## 2018-05-07 LAB — URINALYSIS, COMPLETE (UACMP) WITH MICROSCOPIC
Bacteria, UA: NONE SEEN
Bilirubin Urine: NEGATIVE
Glucose, UA: NEGATIVE mg/dL
Hgb urine dipstick: NEGATIVE
Ketones, ur: NEGATIVE mg/dL
Leukocytes, UA: NEGATIVE
Nitrite: NEGATIVE
Protein, ur: NEGATIVE mg/dL
Specific Gravity, Urine: 1.004 — ABNORMAL LOW (ref 1.005–1.030)
WBC, UA: NONE SEEN WBC/hpf (ref 0–5)
pH: 7 (ref 5.0–8.0)

## 2018-05-07 LAB — CBC
HCT: 44.8 % (ref 35.0–47.0)
Hemoglobin: 15.2 g/dL (ref 12.0–16.0)
MCH: 31.5 pg (ref 26.0–34.0)
MCHC: 34 g/dL (ref 32.0–36.0)
MCV: 92.5 fL (ref 80.0–100.0)
Platelets: 222 10*3/uL (ref 150–440)
RBC: 4.84 MIL/uL (ref 3.80–5.20)
RDW: 12.6 % (ref 11.5–14.5)
WBC: 7.8 10*3/uL (ref 3.6–11.0)

## 2018-05-07 LAB — LIPASE, BLOOD: Lipase: 24 U/L (ref 11–51)

## 2018-05-07 MED ORDER — DICYCLOMINE HCL 20 MG PO TABS: 20.0000 mg | ORAL_TABLET | Freq: Three times a day (TID) | ORAL | 0 refills | Status: DC | PRN

## 2018-05-07 MED ORDER — MORPHINE SULFATE (PF) 4 MG/ML IV SOLN
4.0000 mg | Freq: Once | INTRAVENOUS | Status: AC
Start: 2018-05-07 — End: 2018-05-07
  Administered 2018-05-07: 4 mg via INTRAVENOUS
  Filled 2018-05-07: qty 1

## 2018-05-07 MED ORDER — HYDROCHLOROTHIAZIDE 12.5 MG PO TABS: 12.5000 mg | ORAL_TABLET | Freq: Every day | ORAL | 5 refills | Status: DC

## 2018-05-07 MED ORDER — MORPHINE SULFATE (PF) 4 MG/ML IV SOLN
4.0000 mg | Freq: Once | INTRAVENOUS | Status: AC
  Administered 2018-05-07: 4 mg via INTRAVENOUS
  Filled 2018-05-07: qty 1

## 2018-05-07 MED ORDER — IOPAMIDOL (ISOVUE-300) INJECTION 61%
80.0000 mL | Freq: Once | INTRAVENOUS | Status: AC | PRN
  Administered 2018-05-07: 80 mL via INTRAVENOUS

## 2018-05-07 NOTE — ED Triage Notes (Signed)
Pt to ED reporting upper bilateral stomach pains. PT reports a hx of pancreatitis 20 years ago and states the pain feels similar. Nausea but no vomiting. Constipation over the past week  That pt has been treating with miralax and prune juice. PT reports pain has worsened today and she was unable to sit up due to the pain with straining.

## 2018-05-07 NOTE — ED Notes (Signed)

## 2018-05-07 NOTE — Patient Instructions (Addendum)
[name],   Thank you for coming in to clinic today.  1. Your abdominal pain could be from pancreatitis, diverticulitis, possible ileus, peptic/duodenal ulcer.    2. For blood pressure: - Stop lisinopril to avoid hypotension. - Temporarily reduce HCTZ to 12.5 mg once daily - continue this if possible at 25 mg for ENT work with your dizziness once you are well hydrated again.   Please schedule a follow-up appointment with [name], AGNP. Return for As needed after ED visit.  Please go to ED today for labs and imaging..  If you have any other questions or concerns, please feel free to call the clinic or send a message through [place]. You may also schedule an earlier appointment if necessary.  You will receive a survey after today's visit either digitally by e-mail or paper by [employer]. Your experiences and feedback matter to [place].  Please respond so we know how we are doing as we provide care for you.   [name], DNP, AGNP-BC Adult Gerontology Nurse Practitioner [facility], [facility]

## 2018-05-07 NOTE — ED Provider Notes (Signed)
[facility] Emergency Department Provider Note       Time seen: ----------------------------------------- 12:06 PM on 05/07/2018 -----------------------------------------   I have reviewed the triage vital signs and the nursing notes.  HISTORY   Chief Complaint Abdominal Pain    HPI Denise Elliott is a 57 y.o. female with a history of allergies, anxiety, depression, fibromyalgia, GERD who presents to the ED for abdominal pain.  She complains of pain across the abdomen diffusely in the upper abdomen.  She reports a history of pancreatitis 20 years ago and states this feels similarly.  She said nausea but no vomiting.  She said constipation over the past week treated with MiraLAX and prune juice without much improvement.  She states the pain was worse today.  Past Medical History:  Diagnosis Date  . Allergy   . Anxiety   . Arthritis   . Cervical dystonia   . Depression   . Fibromyalgia   . Fibromyalgia   . GERD (gastroesophageal reflux disease)   . Glaucoma    closed angle glaucoma both eyes, laser treatment in past  . Goiter   . HLD (hyperlipidemia)    statin intolerance  . Hypertension   . 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 Sleep apnea   . Sleep apnea   . Sleep apnea     Patient Active Problem List   Diagnosis Date Noted  . GERD (gastroesophageal reflux disease) 07/29/2017  . Cervical dystonia 07/29/2017  . Arthritis 07/29/2017  . Fibromyalgia 07/29/2017  . HLD (hyperlipidemia) 07/29/2017  . Hypertension 07/29/2017  . Circulation disorder of lower extremity 07/29/2017  . Anxiety 06/29/2017  . Encounter to establish care 06/29/2017    Past Surgical History:  Procedure Laterality Date  . CHOLECYSTECTOMY    . COSMETIC SURGERY Left    cheek bone  . GALLBLADDER SURGERY    . TONSILLECTOMY    . TONSILLECTOMY      Allergies Versed [midazolam]  Social History Social History   Tobacco  Use  . Smoking status: Never Smoker  . Smokeless tobacco: Never Used  Substance Use Topics  . Alcohol use: Yes    Comment: socially  . Drug use: No   Review of Systems Constitutional: Negative for fever. Cardiovascular: Negative for chest pain. Respiratory: Negative for shortness of breath. Gastrointestinal: Positive for abdominal pain, constipation Genitourinary: Negative for dysuria. Musculoskeletal: Negative for back pain. Skin: Negative for rash. Neurological: Negative for headaches, focal weakness or numbness.  All systems negative/normal/unremarkable except as stated in the HPI  ____________________________________________   PHYSICAL EXAM:  VITAL SIGNS: ED Triage Vitals  Enc Vitals Group     BP 05/07/18 1136 (!) 146/84     Pulse Rate 05/07/18 1136 72     Resp 05/07/18 1136 18     Temp 05/07/18 1136 97.9 F (36.6 C)     Temp Source 05/07/18 1136 Oral     SpO2 05/07/18 1136 99 %     Weight 05/07/18 1136 115 lb (52.2 kg)     Height 05/07/18 1136 5' 5.5" (1.664 m)     Head Circumference --      Peak Flow --      Pain Score 05/07/18 1140 8     Pain Loc --      Pain Edu? --      Excl. in [place]? --    Constitutional: Alert and oriented. Well appearing and in no distress. Eyes: Conjunctivae are normal. Normal  extraocular movements. ENT   Head: Normocephalic and atraumatic.   Nose: No congestion/rhinnorhea.   Mouth/Throat: Mucous membranes are moist.   Neck: No stridor. Cardiovascular: Normal rate, regular rhythm. No murmurs, rubs, or gallops. Respiratory: Normal respiratory effort without tachypnea nor retractions. Breath sounds are clear and equal bilaterally. No wheezes/rales/rhonchi. Gastrointestinal: Diffuse nonfocal tenderness, normal bowel sounds. Musculoskeletal: Nontender with normal range of motion in extremities. No lower extremity tenderness nor edema. Neurologic:  Normal speech and language. No gross focal neurologic deficits are appreciated.   Skin:  Skin is warm, dry and intact. No rash noted. Psychiatric: Mood and affect are normal. Speech and behavior are normal.  ____________________________________________  ED COURSE:  As part of my medical decision making, I reviewed the following data within the [place] History obtained from family if available, nursing notes, old chart and ekg, as well as notes from prior ED visits. Patient presented for abdominal pain, we will assess with labs and imaging as indicated at this time.   Procedures ____________________________________________   LABS (pertinent positives/negatives)  Labs Reviewed  COMPREHENSIVE METABOLIC PANEL - Abnormal; Notable for the following components:      Result Value   Chloride 97 (*)    All other components within normal limits  URINALYSIS, COMPLETE (UACMP) WITH MICROSCOPIC - Abnormal; Notable for the following components:   Color, Urine YELLOW (*)    APPearance CLEAR (*)    Specific Gravity, Urine 1.004 (*)    All other components within normal limits  LIPASE, BLOOD  CBC    RADIOLOGY Images were viewed by me  Abdomen 2 view IMPRESSION: Negative.  IMPRESSION: Small volume of free pelvic fluid is nonspecific. Its cause is not identified but it could be secondary to gastroenteritis. The bowel appears normal in the study  Mild intrahepatic biliary ductal dilatation is likely related to gallbladder. Review of the patient's labs demonstrates normal bilirubin.  ____________________________________________  DIFFERENTIAL DIAGNOSIS   Constipation, gas pain, UTI, pancreatitis, GERD  FINAL ASSESSMENT AND PLAN  Abdominal pain, constipation   Plan: The patient had presented for abdominal pain. Patient's labs were reassuring. Patient's imaging did not reveal any acute process other than some small free pelvic fluid which is nonspecific.  She is cleared for outpatient follow-up with [facility].   Laurence Aly,  MD   Note: This note was generated in part or whole with voice recognition software. Voice recognition is usually quite accurate but there are transcription errors that can and very often do occur. I apologize for any typographical errors that were not detected and corrected.     Earleen Newport, MD 05/07/18 1438

## 2018-05-07 NOTE — Progress Notes (Signed)
Subjective:    Patient ID: Denise Elliott, female    DOB: 27-May-1961, 57 y.o.   MRN: [id number]  Denise Elliott is a 57 y.o. female presenting on 05/07/2018 for Abdominal Pain (severe upper gastric pain x 3 days,  constipation x 1 week. Pt taking miralax x 2 days  ) and Hypotension (pt state since taking the HCTZ 25MG , dizziness, low blood pressure )   HPI Abdominal pain Started about 3 days ago.  Has had constipation x 1 week after increase in HCTZ.  Is taking miralax x 2 days.   Nothing is helping so far.  Had initially eaten prunes and more fiber.  Is decreasing diet to bland diet, but has decided to reduce to clear liq diet.  Husband drove today 2/2 dizziness today.  Has had small BM yesterday. Appetite is decreasing.  Pain is present at rest but increases with movement.  Pain also increases after eating or drinking anything.  Today, patient needed assistance getting out of bed.  Now has dull pain 8/10 at rest.  Sharp with movement.  - Has history of pancreatitis 20 years ago.  Is now having sharp pain LUQ, which is the same type of pain she had with pancreatitis then.     Hypotension Dizziness with increase in HCTZ SBP 98 after dinner one night this week. For dizziness 60% less balance adjustments in R ear - will start vestibular therapy June 10.  Social History   Tobacco Use  . Smoking status: Never Smoker  . Smokeless tobacco: Never Used  Substance Use Topics  . Alcohol use: Yes    Comment: socially  . Drug use: No    Review of Systems Per HPI unless specifically indicated above     Objective:    BP 108/77 (BP Location: Right Arm, Patient Position: Sitting, Cuff Size: Normal)   Pulse 76   Temp 98 F (36.7 C) (Oral)   Ht 5' 5.5" (1.664 m)   Wt 115 lb 6.4 oz (52.3 kg)   BMI 18.91 kg/m   Wt Readings from Last 3 Encounters:  05/07/18 115 lb 6.4 oz (52.3 kg)  02/26/18 118 lb 6.4 oz (53.7 kg)  12/29/17 121 lb 3.2 oz (55 kg)    Physical Exam  Constitutional: She is  oriented to person, place, and time. She appears well-developed and well-nourished. No distress.  Cardiovascular: Normal rate, regular rhythm, S1 normal, S2 normal, normal heart sounds, intact distal pulses and normal pulses.  Left leg superficial clot at medial knee - 3.5x1 cm  Pulmonary/Chest: Effort normal and breath sounds normal. No respiratory distress.  Abdominal: Bowel sounds are increased. There is generalized tenderness (LUQ tenderness is more prominent and causes guarding. Rebound tenderness present at RUQ and LUQ) and tenderness in the right upper quadrant, right lower quadrant, epigastric area, left upper quadrant and left lower quadrant. There is rebound and guarding. There is no tenderness at McBurney's point and negative Murphy's sign.  Musculoskeletal: She exhibits no edema (pedal).  Neurological: She is alert and oriented to person, place, and time.  Skin: Skin is warm and dry. Capillary refill takes less than 2 seconds.  Psychiatric: She has a normal mood and affect. Her behavior is normal.  Vitals reviewed.  Results for orders placed or performed during the hospital encounter of 04/15/18  Comprehensive metabolic panel  Result Value Ref Range   Sodium 137 135 - 145 mmol/L   Potassium 3.6 3.5 - 5.1 mmol/L   Chloride 100 (L) 101 -  111 mmol/L   CO2 29 22 - 32 mmol/L   Glucose, Bld 139 (H) 65 - 99 mg/dL   BUN 11 6 - 20 mg/dL   Creatinine, Ser 0.77 0.44 - 1.00 mg/dL   Calcium 8.9 8.9 - 10.3 mg/dL   Total Protein 6.6 6.5 - 8.1 g/dL   Albumin 3.9 3.5 - 5.0 g/dL   AST 21 15 - 41 U/L   ALT 20 14 - 54 U/L   Alkaline Phosphatase 45 38 - 126 U/L   Total Bilirubin 0.5 0.3 - 1.2 mg/dL   GFR calc non Af Amer >60 >60 mL/min   GFR calc Af Amer >60 >60 mL/min   Anion gap 8 5 - 15      Assessment & Plan:   Problem List Items Addressed This Visit      Cardiovascular and Mediastinum   Hypertension   Relevant Medications   hydrochlorothiazide (HYDRODIURIL) 12.5 MG tablet      Other Visit Diagnoses    Acute abdominal pain    -  Primary   Generalized abdominal tenderness with rebound tenderness       Hypotension due to drugs       Relevant Medications   hydrochlorothiazide (HYDRODIURIL) 12.5 MG tablet   Other constipation          Acute abdominal pain: Pt with acute abdominal pain with rebound tenderness worst in LUQ.  Generalized tenderness of entire abdomen is present.  Concern for pancreatitis, diverticulitis, possible ileus, or peptic/duodenal ulcer. - Complicated by constipation and dehydration from HCTZ  Plan: 1. Reduce HCTZ.   2. Continue with workup by going to ED today.  Rebound tenderness and symptoms are concerning for acute illness that requires urgent reatment.   Hypotension: Pt treated for essential hypertension with hypotension after HCTZ increase for dizziness treatment.   Plan: STOP lisionpril Reduce HCTZ to 12.5 mg once daily until well hydrated again.  Then, we will consider increasing back to 25 mg once daily after 2-3 weeks.   Meds ordered this encounter  Medications  . hydrochlorothiazide (HYDRODIURIL) 12.5 MG tablet    Sig: Take 1 tablet (12.5 mg total) by mouth daily.    Dispense:  30 tablet    Refill:  5    Order Specific Question:   Supervising Provider    Answer:   Olin Hauser [[id number]]    Follow up plan: Return for As needed after ED visit.  Please go to ED today for labs and imaging..  A total of 40 minutes was spent face-to-face with this patient. Greater than 50% of this time was spent in counseling and coordination of care with the patient.    [name], DNP, AGPCNP-BC Adult Gerontology Primary Care Nurse Practitioner [employer] 05/07/2018, 10:37 AM

## 2018-05-26 ENCOUNTER — Other Ambulatory Visit: Payer: Self-pay | Admitting: Nurse Practitioner

## 2018-05-26 DIAGNOSIS — F419 Anxiety disorder, unspecified: Secondary | ICD-10-CM

## 2018-06-08 ENCOUNTER — Other Ambulatory Visit: Payer: Self-pay | Admitting: Nurse Practitioner

## 2018-06-14 ENCOUNTER — Other Ambulatory Visit: Payer: Self-pay | Admitting: Nurse Practitioner

## 2018-06-14 DIAGNOSIS — K219 Gastro-esophageal reflux disease without esophagitis: Secondary | ICD-10-CM

## 2018-06-17 ENCOUNTER — Other Ambulatory Visit: Payer: Self-pay | Admitting: Nurse Practitioner

## 2018-06-17 DIAGNOSIS — F419 Anxiety disorder, unspecified: Secondary | ICD-10-CM

## 2018-07-01 ENCOUNTER — Other Ambulatory Visit: Payer: Self-pay

## 2018-07-01 DIAGNOSIS — K219 Gastro-esophageal reflux disease without esophagitis: Secondary | ICD-10-CM

## 2018-07-01 MED ORDER — OMEPRAZOLE 20 MG PO CPDR: 20.0000 mg | DELAYED_RELEASE_CAPSULE | Freq: Every day | ORAL | 1 refills | Status: DC

## 2018-09-22 ENCOUNTER — Encounter: Payer: Self-pay | Admitting: Nurse Practitioner

## 2018-09-22 ENCOUNTER — Other Ambulatory Visit: Payer: Self-pay

## 2018-09-22 ENCOUNTER — Ambulatory Visit: Payer: No Typology Code available for payment source | Admitting: Nurse Practitioner

## 2018-09-22 VITALS — BP 125/74 | HR 72 | Temp 98.1°F | Ht 65.5 in | Wt 124.6 lb

## 2018-09-22 DIAGNOSIS — R1084 Generalized abdominal pain: Secondary | ICD-10-CM

## 2018-09-22 DIAGNOSIS — F419 Anxiety disorder, unspecified: Secondary | ICD-10-CM | POA: Diagnosis not present

## 2018-09-22 DIAGNOSIS — R110 Nausea: Secondary | ICD-10-CM

## 2018-09-22 DIAGNOSIS — F329 Major depressive disorder, single episode, unspecified: Secondary | ICD-10-CM

## 2018-09-22 DIAGNOSIS — F32A Depression, unspecified: Secondary | ICD-10-CM

## 2018-09-22 DIAGNOSIS — L659 Nonscarring hair loss, unspecified: Secondary | ICD-10-CM

## 2018-09-22 DIAGNOSIS — Z23 Encounter for immunization: Secondary | ICD-10-CM | POA: Diagnosis not present

## 2018-09-22 DIAGNOSIS — R42 Dizziness and giddiness: Secondary | ICD-10-CM

## 2018-09-22 DIAGNOSIS — D509 Iron deficiency anemia, unspecified: Secondary | ICD-10-CM

## 2018-09-22 MED ORDER — MIRTAZAPINE 30 MG PO TABS: 30.0000 mg | ORAL_TABLET | Freq: Every day | ORAL | 2 refills | Status: DC

## 2018-09-22 MED ORDER — ONDANSETRON HCL 4 MG PO TABS: 4.0000 mg | ORAL_TABLET | Freq: Three times a day (TID) | ORAL | 0 refills | Status: DC | PRN

## 2018-09-22 NOTE — Patient Instructions (Addendum)
[name],   Thank you for coming in to clinic today.  1. Early am labs in the next week between 8-9 am  2. Increase mirtazapine to 30 mg once daily at bedtime.  This may help with irritability, depression, insomnia.  3. Nausea: start ondansetron 4 mg one tablet up to every 8 hours as needed.  Brand name: Zofran.  Please schedule a follow-up appointment with [name], AGNP. Return in about 2 months (around 11/22/2018) for nausea.  If you have any other questions or concerns, please feel free to call the clinic or send a message through [place]. You may also schedule an earlier appointment if necessary.  You will receive a survey after today's visit either digitally by e-mail or paper by [employer]. Your experiences and feedback matter to [place].  Please respond so we know how we are doing as we provide care for you.   [name], DNP, AGNP-BC Adult Gerontology Nurse Practitioner [place]

## 2018-09-22 NOTE — Progress Notes (Signed)
Subjective:    Patient ID: Denise Elliott, female    DOB: 01-Dec-1961, 57 y.o.   MRN: [id number]  Denise Elliott is a 57 y.o. female presenting on 09/22/2018 for Night Sweats (nauseated x 62mths , fatigue, more irritated and dizziness after eating  ); Leg Pain (restless leg x 4 mths ); Alopecia; and Rash (3 ring like spots on her stomach x 1 week )   HPI Ongoing malaise - patient continues having general malaise.  Symptoms include nausea x 6 months, fatigue, irritability, dizziness after eating.  Is eating more often throughout the day with  - Abdominal muscle pain is much improved from last visit. - Still has occasional bowel incontinence x 2 days in a row, but not as often. - Patient also has moderate hair loss, but not coming out in clumps. - Daughter is concerned for possible addison's disease.    Leg pain Has tried dry needling with legs and no relief.  Baclofen has been tried for neck without much assistance for lower extremities.    Rash X 1 week patient has had annular rash on stomach.  Social History   Tobacco Use  . Smoking status: Never Smoker  . Smokeless tobacco: Never Used  Substance Use Topics  . Alcohol use: Yes    Comment: socially  . Drug use: No    Review of Systems Per HPI unless specifically indicated above     Objective:    BP 125/74 (BP Location: Right Arm, Patient Position: Sitting, Cuff Size: Small)   Pulse 72   Temp 98.1 F (36.7 C) (Oral)   Ht 5' 5.5" (1.664 m)   Wt 124 lb 9.6 oz (56.5 kg)   BMI 20.42 kg/m   Wt Readings from Last 3 Encounters:  09/22/18 124 lb 9.6 oz (56.5 kg)  05/07/18 115 lb (52.2 kg)  05/07/18 115 lb 6.4 oz (52.3 kg)    Physical Exam  Constitutional: She is oriented to person, place, and time. She appears well-developed and well-nourished. No distress.  HENT:  Head: Normocephalic and atraumatic.  Cardiovascular: Normal rate, regular rhythm, S1 normal, S2 normal, normal heart sounds and intact distal pulses.    Pulmonary/Chest: Effort normal and breath sounds normal. No respiratory distress.  Abdominal: Bowel sounds are normal. There is no hepatosplenomegaly. There is generalized tenderness. There is no rigidity, no rebound, no guarding, no CVA tenderness, no tenderness at McBurney's point and negative Murphy's sign. No hernia.  Neurological: She is alert and oriented to person, place, and time.  Skin: Skin is warm and dry.     Psychiatric: She has a normal mood and affect. Her behavior is normal.  Vitals reviewed.    Results for orders placed or performed during the hospital encounter of 05/07/18  Lipase, blood  Result Value Ref Range   Lipase 24 11 - 51 U/L  Comprehensive metabolic panel  Result Value Ref Range   Sodium 135 135 - 145 mmol/L   Potassium 4.2 3.5 - 5.1 mmol/L   Chloride 97 (L) 101 - 111 mmol/L   CO2 29 22 - 32 mmol/L   Glucose, Bld 95 65 - 99 mg/dL   BUN 8 6 - 20 mg/dL   Creatinine, Ser 0.67 0.44 - 1.00 mg/dL   Calcium 9.2 8.9 - 10.3 mg/dL   Total Protein 7.0 6.5 - 8.1 g/dL   Albumin 4.0 3.5 - 5.0 g/dL   AST 24 15 - 41 U/L   ALT 23 14 - 54 U/L  Alkaline Phosphatase 68 38 - 126 U/L   Total Bilirubin 0.8 0.3 - 1.2 mg/dL   GFR calc non Af Amer >60 >60 mL/min   GFR calc Af Amer >60 >60 mL/min   Anion gap 9 5 - 15  CBC  Result Value Ref Range   WBC 7.8 3.6 - 11.0 K/uL   RBC 4.84 3.80 - 5.20 MIL/uL   Hemoglobin 15.2 12.0 - 16.0 g/dL   HCT 44.8 35.0 - 47.0 %   MCV 92.5 80.0 - 100.0 fL   MCH 31.5 26.0 - 34.0 pg   MCHC 34.0 32.0 - 36.0 g/dL   RDW 12.6 11.5 - 14.5 %   Platelets 222 150 - 440 K/uL  Urinalysis, Complete w Microscopic  Result Value Ref Range   Color, Urine YELLOW (A) YELLOW   APPearance CLEAR (A) CLEAR   Specific Gravity, Urine 1.004 (L) 1.005 - 1.030   pH 7.0 5.0 - 8.0   Glucose, UA NEGATIVE NEGATIVE mg/dL   Hgb urine dipstick NEGATIVE NEGATIVE   Bilirubin Urine NEGATIVE NEGATIVE   Ketones, ur NEGATIVE NEGATIVE mg/dL   Protein, ur NEGATIVE  NEGATIVE mg/dL   Nitrite NEGATIVE NEGATIVE   Leukocytes, UA NEGATIVE NEGATIVE   RBC / HPF 0-5 0 - 5 RBC/hpf   WBC, UA NONE SEEN 0 - 5 WBC/hpf   Bacteria, UA NONE SEEN NONE SEEN   Squamous Epithelial / LPF 0-5 0 - 5   Mucus PRESENT       Assessment & Plan:   Problem List Items Addressed This Visit    None    Visit Diagnoses    Nausea    -  Primary   Relevant Orders   ACTH (Completed)   Cortisol, free, Serum (Completed)   COMPLETE METABOLIC PANEL WITH GFR (Completed)   CBC with Differential/Platelet (Completed)   Ambulatory referral to Endocrinology   Anxiety and depression     Patient with ongoing irritability, depression, insomnia. - INCREASE mirtazapine to 30 mg once daily at bedtime. Currently tolerates well. - Follow-up 2 months     Dizziness       Relevant Orders   ACTH (Completed)   Cortisol, free, Serum (Completed)   COMPLETE METABOLIC PANEL WITH GFR (Completed)   CBC with Differential/Platelet (Completed)   Ambulatory referral to Endocrinology   Generalized abdominal pain       Relevant Orders   ACTH (Completed)   Cortisol, free, Serum (Completed)   COMPLETE METABOLIC PANEL WITH GFR (Completed)   CBC with Differential/Platelet (Completed)   Ambulatory referral to Endocrinology   Hair loss       Relevant Orders   TSH (Completed)   Needs flu shot       Relevant Orders   Flu Vaccine QUAD 6+ mos PF IM (Fluarix Quad PF) (Completed)    # for general malaise: Patient has constellation of general/vague symptoms that may indicate addison's disease.   - Labs in early am in next 7 days. - For nausea - treat symptoms only with ondansetron prn. - Referral endocrinology - Follow-up 2 months and prn.  Meds ordered this encounter  Medications  . ondansetron (ZOFRAN) 4 MG tablet    Sig: Take 1 tablet (4 mg total) by mouth every 8 (eight) hours as needed for nausea or vomiting.    Dispense:  30 tablet    Refill:  0    Order Specific Question:   Supervising Provider      Answer:   Olin Hauser [[id number]]  .  mirtazapine (REMERON) 30 MG tablet    Sig: Take 1 tablet (30 mg total) by mouth at bedtime.    Dispense:  30 tablet    Refill:  2    Order Specific Question:   Supervising Provider    Answer:   Olin Hauser [[id number]]    Follow up plan: Return in about 2 months (around 11/22/2018) for nausea.  [name], DNP, AGPCNP-BC Adult Gerontology Primary Care Nurse Practitioner [facility] 09/22/2018, 4:25 PM

## 2018-09-30 ENCOUNTER — Other Ambulatory Visit: Payer: Self-pay | Admitting: Nurse Practitioner

## 2018-09-30 DIAGNOSIS — J321 Chronic frontal sinusitis: Secondary | ICD-10-CM

## 2018-10-06 LAB — CBC WITH DIFFERENTIAL/PLATELET
Basophils Absolute: 59 cells/uL (ref 0–200)
Basophils Relative: 1 %
Eosinophils Absolute: 378 cells/uL (ref 15–500)
Eosinophils Relative: 6.4 %
HCT: 46 % — ABNORMAL HIGH (ref 35.0–45.0)
Hemoglobin: 15.6 g/dL — ABNORMAL HIGH (ref 11.7–15.5)
Lymphs Abs: 1994 cells/uL (ref 850–3900)
MCH: 30.5 pg (ref 27.0–33.0)
MCHC: 33.9 g/dL (ref 32.0–36.0)
MCV: 90 fL (ref 80.0–100.0)
MPV: 10.2 fL (ref 7.5–12.5)
Monocytes Relative: 5.2 %
Neutro Abs: 3162 cells/uL (ref 1500–7800)
Neutrophils Relative %: 53.6 %
Platelets: 237 10*3/uL (ref 140–400)
RBC: 5.11 10*6/uL — ABNORMAL HIGH (ref 3.80–5.10)
RDW: 11.5 % (ref 11.0–15.0)
Total Lymphocyte: 33.8 %
WBC mixed population: 307 cells/uL (ref 200–950)
WBC: 5.9 10*3/uL (ref 3.8–10.8)

## 2018-10-06 LAB — COMPLETE METABOLIC PANEL WITH GFR
AG Ratio: 1.7 (calc) (ref 1.0–2.5)
ALT: 14 U/L (ref 6–29)
AST: 17 U/L (ref 10–35)
Albumin: 4.4 g/dL (ref 3.6–5.1)
Alkaline phosphatase (APISO): 50 U/L (ref 33–130)
BUN: 13 mg/dL (ref 7–25)
CO2: 29 mmol/L (ref 20–32)
Calcium: 9.6 mg/dL (ref 8.6–10.4)
Chloride: 100 mmol/L (ref 98–110)
Creat: 0.83 mg/dL (ref 0.50–1.05)
GFR, Est African American: 90 mL/min/{1.73_m2} (ref >=60)
GFR, Est Non African American: 78 mL/min/{1.73_m2} (ref >=60)
Globulin: 2.6 g/dL (calc) (ref 1.9–3.7)
Glucose, Bld: 100 mg/dL (ref 65–139)
Potassium: 4.7 mmol/L (ref 3.5–5.3)
Sodium: 139 mmol/L (ref 135–146)
Total Bilirubin: 0.6 mg/dL (ref 0.2–1.2)
Total Protein: 7 g/dL (ref 6.1–8.1)

## 2018-10-06 LAB — IRON,TIBC AND FERRITIN PANEL
%SAT: 35 % (calc) (ref 16–45)
Ferritin: 85 ng/mL (ref 16–232)
Iron: 178 ug/dL — ABNORMAL HIGH (ref 45–160)
TIBC: 509 mcg/dL (calc) — ABNORMAL HIGH (ref 250–450)

## 2018-10-06 LAB — ACTH: C206 ACTH: 13 pg/mL (ref 6–50)

## 2018-10-06 LAB — CORTISOL, FREE: Cortisol Free, Ser: 1.02 ug/dL — ABNORMAL HIGH

## 2018-10-06 LAB — TEST AUTHORIZATION

## 2018-10-06 LAB — TSH: TSH: 2.68 mIU/L (ref 0.40–4.50)

## 2018-10-11 ENCOUNTER — Other Ambulatory Visit: Payer: Self-pay | Admitting: Nurse Practitioner

## 2018-10-11 DIAGNOSIS — R110 Nausea: Secondary | ICD-10-CM

## 2018-10-12 ENCOUNTER — Other Ambulatory Visit: Payer: Self-pay | Admitting: Nurse Practitioner

## 2018-10-12 DIAGNOSIS — R110 Nausea: Secondary | ICD-10-CM

## 2018-10-12 MED ORDER — ONDANSETRON HCL 4 MG PO TABS: ORAL_TABLET | ORAL | 0 refills | Status: DC

## 2018-10-15 ENCOUNTER — Other Ambulatory Visit: Payer: Self-pay | Admitting: Nurse Practitioner

## 2018-10-15 DIAGNOSIS — F329 Major depressive disorder, single episode, unspecified: Secondary | ICD-10-CM

## 2018-10-15 DIAGNOSIS — F32A Depression, unspecified: Secondary | ICD-10-CM

## 2018-10-15 DIAGNOSIS — F419 Anxiety disorder, unspecified: Principal | ICD-10-CM

## 2018-10-20 ENCOUNTER — Encounter: Payer: Self-pay | Admitting: Nurse Practitioner

## 2018-12-04 ENCOUNTER — Other Ambulatory Visit: Payer: Self-pay | Admitting: Obstetrics and Gynecology

## 2018-12-27 ENCOUNTER — Other Ambulatory Visit: Payer: Self-pay | Admitting: Obstetrics and Gynecology

## 2019-01-02 ENCOUNTER — Other Ambulatory Visit: Payer: Self-pay | Admitting: Nurse Practitioner

## 2019-01-02 DIAGNOSIS — I10 Essential (primary) hypertension: Secondary | ICD-10-CM

## 2019-01-04 NOTE — Telephone Encounter (Signed)
Covering inbox for Denise Elliott, AGPCNP-BC while she is out of office.  Received refill request for Hydrochlorothiazide 25mg  tablets.  Back in 04/2018 she was reduced to HALF DOSE = 12.5mg . She should have HCTZ 12.5mg  tablets once daily.  -------- Could you call patient to clarify which dose is she taking - the 12.5mg  or the 25mg  daily for BP?  I can rx whichever she is currently taking.  [name], [facility] 01/04/2019, 6:16 PM

## 2019-01-11 ENCOUNTER — Telehealth: Payer: Self-pay | Admitting: Nurse Practitioner

## 2019-01-11 NOTE — Telephone Encounter (Signed)
Pt called requesting refill on hydrochlorothiazide 25 mg called into [facility]

## 2019-01-11 NOTE — Telephone Encounter (Signed)
RX refill escribe sent to PCP

## 2019-01-15 ENCOUNTER — Telehealth: Payer: Self-pay | Admitting: Nurse Practitioner

## 2019-01-15 ENCOUNTER — Other Ambulatory Visit: Payer: Self-pay

## 2019-01-15 DIAGNOSIS — Z1239 Encounter for other screening for malignant neoplasm of breast: Secondary | ICD-10-CM

## 2019-01-15 DIAGNOSIS — I10 Essential (primary) hypertension: Secondary | ICD-10-CM

## 2019-01-15 MED ORDER — HYDROCHLOROTHIAZIDE 12.5 MG PO TABS: 12.5000 mg | ORAL_TABLET | Freq: Every day | ORAL | 5 refills | Status: DC

## 2019-01-15 NOTE — Telephone Encounter (Signed)
Pt needs an order for screening mammogram.

## 2019-01-15 NOTE — Telephone Encounter (Signed)
Please provide patient with phone number to call [facility]. Order is placed.  Your mammogram order has been placed.  Call the Scheduling phone number at [phone] to schedule your mammogram at your convenience.  You can choose to go to either location listed below.  Let the scheduler know which location you prefer.  [place]  [place], [place] [address]   [facility] [address] [place], [place] [address]

## 2019-01-15 NOTE — Telephone Encounter (Signed)
The pt was notified. No questions or concerns. 

## 2019-01-18 ENCOUNTER — Other Ambulatory Visit: Payer: Self-pay

## 2019-01-18 DIAGNOSIS — I10 Essential (primary) hypertension: Secondary | ICD-10-CM

## 2019-01-18 MED ORDER — HYDROCHLOROTHIAZIDE 12.5 MG PO TABS: 25.0000 mg | ORAL_TABLET | Freq: Every day | ORAL | 5 refills | Status: DC

## 2019-02-02 ENCOUNTER — Ambulatory Visit
Admission: RE | Admit: 2019-02-02 | Discharge: 2019-02-02 | Disposition: A | Discharge status: Home / Self Care | Payer: BLUE CROSS/BLUE SHIELD | Source: Ambulatory Visit | Attending: Nurse Practitioner | Admitting: Nurse Practitioner

## 2019-02-02 DIAGNOSIS — Z1239 Encounter for other screening for malignant neoplasm of breast: Secondary | ICD-10-CM

## 2019-02-02 DIAGNOSIS — Z1231 Encounter for screening mammogram for malignant neoplasm of breast: Secondary | ICD-10-CM | POA: Diagnosis not present

## 2019-02-24 ENCOUNTER — Other Ambulatory Visit: Payer: Self-pay | Admitting: Nurse Practitioner

## 2019-02-24 DIAGNOSIS — I10 Essential (primary) hypertension: Secondary | ICD-10-CM

## 2019-02-25 NOTE — Telephone Encounter (Signed)
Pharmacy requesting refills. Thanks!  

## 2019-03-07 ENCOUNTER — Other Ambulatory Visit: Payer: Self-pay | Admitting: Nurse Practitioner

## 2019-03-07 DIAGNOSIS — I10 Essential (primary) hypertension: Secondary | ICD-10-CM

## 2019-03-19 ENCOUNTER — Other Ambulatory Visit: Payer: Self-pay | Admitting: Nurse Practitioner

## 2019-03-19 DIAGNOSIS — I10 Essential (primary) hypertension: Secondary | ICD-10-CM

## 2019-03-24 ENCOUNTER — Telehealth: Payer: Self-pay | Admitting: Nurse Practitioner

## 2019-03-24 ENCOUNTER — Other Ambulatory Visit: Payer: Self-pay

## 2019-03-24 ENCOUNTER — Ambulatory Visit (INDEPENDENT_AMBULATORY_CARE_PROVIDER_SITE_OTHER): Payer: No Typology Code available for payment source | Admitting: Nurse Practitioner

## 2019-03-24 ENCOUNTER — Encounter: Payer: Self-pay | Admitting: Nurse Practitioner

## 2019-03-24 DIAGNOSIS — M797 Fibromyalgia: Secondary | ICD-10-CM

## 2019-03-24 DIAGNOSIS — K5901 Slow transit constipation: Secondary | ICD-10-CM | POA: Diagnosis not present

## 2019-03-24 DIAGNOSIS — G4700 Insomnia, unspecified: Secondary | ICD-10-CM | POA: Diagnosis not present

## 2019-03-24 DIAGNOSIS — R110 Nausea: Secondary | ICD-10-CM | POA: Diagnosis not present

## 2019-03-24 DIAGNOSIS — I10 Essential (primary) hypertension: Secondary | ICD-10-CM

## 2019-03-24 MED ORDER — ONDANSETRON HCL 4 MG PO TABS: ORAL_TABLET | ORAL | 0 refills | Status: DC

## 2019-03-24 MED ORDER — GABAPENTIN 100 MG PO CAPS: 100.0000 mg | ORAL_CAPSULE | Freq: Three times a day (TID) | ORAL | 3 refills | Status: DC

## 2019-03-24 NOTE — Patient Instructions (Addendum)
Denise Elliott,   Thank you for coming in to clinic today.  1. Decrease Miralax to 1/2 dose daily - May add Colace stool softener 200-250 mg daily.  - Continue dulcolax only if BM in 2-3 days.  2. Insomnia - we can start Trazodone in 2-3 weeks after gabapentin if it is still needed.  3. For fibromyalgia Gabapentin, start 100 mg capsule at bedtime for 2 weeks. - Then take 100 mg capsule twice daily for 2 weeks. - Then may take 100 mg capsule three times daily and continue. - Caution drowsiness.  If drowsy, do not increase dose.  4. May use 1/2 tab tizanidine for daytime.  5. Check BP today - Continue hydrochlorothiazide 12.5 mg once daily.  Please schedule a follow-up appointment with [name], AGNP. Return in about 3 months (around 06/23/2019) for hypertension.  If you have any other questions or concerns, please feel free to call the clinic or send a message through [place]. You may also schedule an earlier appointment if necessary.  You will receive a survey after today's visit either digitally by e-mail or paper by [employer]. Your experiences and feedback matter to [place].  Please respond so we know how we are doing as we provide care for you.   [name], DNP, AGNP-BC Adult Gerontology Nurse Practitioner [place]

## 2019-03-24 NOTE — Progress Notes (Signed)
Telemedicine Encounter: Disclosed to patient at start of encounter that we will provide appropriate telemedicine services.  Patient consents to be treated via phone prior to discussion. - Patient is at her home and is accessed via telephone. - Services are provided by [name] from [facility].  Subjective:    Patient ID: Denise Elliott, female    DOB: [birth year], 58 y.o.   MRN: [id number]  Denise Elliott is a 58 y.o. female presenting on 03/24/2019 for Constipation (x 2weeks. She have currently been using maalox, ducolox and prune juice w/ some improvement.   nausea that the patient associates with her inner ear imbalance ); Insomnia (pt recently saw her neurologist was taking off Mirtazipine and feels like this have alot to do with the insomnia); and Muscle Pain (she associates with her fibromyalgia)  HPI Insomnia Patient was seen by neurology in the last 1 month.  Dr. Manuella Ghazi stopped mirtazapine, changed baclofen changed to tizanidine.  Stable weight since stopping. Mirtazapine, but notes increasing problems with insomnia.  Dr. Manuella Ghazi mentioned trazodone as alternative.  Since stopping mirtazapine, patient notes her  RLS is improved at night.  Constipation Patient reports constipation for last 2 weeks.  Notes with home OTC medications, stool is starting to get softer.  Previously very hard.  BM frequency is about every 2-3 days. Problem patient notes that is most bothersome now is abdominal pain in middle of night with GI discomfort, abdomen is also "a little hard" when cramping is occurring.  Previously had BM daily.  Hypertension Patient is not checking BP regularly.  She knows it is higher due to pain. - Pt denies headache, lightheadedness, dizziness, changes in vision, chest tightness/pressure, palpitations, leg swelling, sudden loss of speech or loss of consciousness.  - States she is also feeling "more emotional" so started back on her prempro, prescribed by Dr.  Marcelline Mates.  Patient states her hormones help with this stability.  Continues Prempro.  Fibromyalgia  Requests to return to fibromyalgia meds due to muscle pain. Continues having neck pain/joint pains as well.  Is only taking Tylenol.  Tizanidine helps some symptoms, but always makes her sleepy.  Patient requests to resume gabapentin which helped her symptoms some in past (> 2 years since taking).  Social History   Tobacco Use  . Smoking status: Never Smoker  . Smokeless tobacco: Never Used  Substance Use Topics  . Alcohol use: Yes    Comment: socially  . Drug use: No    Review of Systems Per HPI unless specifically indicated above     Objective:    There were no vitals taken for this visit.  Patient reported home BP after visit: 170/91; 5 minutes later: 154/93  Wt Readings from Last 3 Encounters:  09/22/18 124 lb 9.6 oz (56.5 kg)  05/07/18 115 lb (52.2 kg)  05/07/18 115 lb 6.4 oz (52.3 kg)    Physical Exam Patient remotely monitored.  Verbal communication appropriate.  Cognition normal.   Results for orders placed or performed in visit on 09/22/18  ACTH  Result Value Ref Range   C206 ACTH 13 6 - 50 pg/mL  Cortisol, free, Serum  Result Value Ref Range   Cortisol Free, Ser 1.02 (H) mcg/dL  COMPLETE METABOLIC PANEL WITH GFR  Result Value Ref Range   Glucose, Bld 100 65 - 139 mg/dL   BUN 13 7 - 25 mg/dL   Creat 0.83 0.50 - 1.05 mg/dL   GFR, Est Non African American 78 >  OR = 60 mL/min/1.35m2   GFR, Est African American 90 > OR = 60 mL/min/1.66m2   BUN/Creatinine Ratio NOT APPLICABLE 6 - 22 (calc)   Sodium 139 135 - 146 mmol/L   Potassium 4.7 3.5 - 5.3 mmol/L   Chloride 100 98 - 110 mmol/L   CO2 29 20 - 32 mmol/L   Calcium 9.6 8.6 - 10.4 mg/dL   Total Protein 7.0 6.1 - 8.1 g/dL   Albumin 4.4 3.6 - 5.1 g/dL   Globulin 2.6 1.9 - 3.7 g/dL (calc)   AG Ratio 1.7 1.0 - 2.5 (calc)   Total Bilirubin 0.6 0.2 - 1.2 mg/dL   Alkaline phosphatase (APISO) 50 33 - 130 U/L   AST 17  10 - 35 U/L   ALT 14 6 - 29 U/L  CBC with Differential/Platelet  Result Value Ref Range   WBC 5.9 3.8 - 10.8 Thousand/uL   RBC 5.11 (H) 3.80 - 5.10 Million/uL   Hemoglobin 15.6 (H) 11.7 - 15.5 g/dL   HCT 46.0 (H) 35.0 - 45.0 %   MCV 90.0 80.0 - 100.0 fL   MCH 30.5 27.0 - 33.0 pg   MCHC 33.9 32.0 - 36.0 g/dL   RDW 11.5 11.0 - 15.0 %   Platelets 237 140 - 400 Thousand/uL   MPV 10.2 7.5 - 12.5 fL   Neutro Abs 3,162 1,500 - 7,800 cells/uL   Lymphs Abs 1,994 850 - 3,900 cells/uL   WBC mixed population 307 200 - 950 cells/uL   Eosinophils Absolute 378 15 - 500 cells/uL   Basophils Absolute 59 0 - 200 cells/uL   Neutrophils Relative % 53.6 %   Total Lymphocyte 33.8 %   Monocytes Relative 5.2 %   Eosinophils Relative 6.4 %   Basophils Relative 1.0 %  TSH  Result Value Ref Range   TSH 2.68 0.40 - 4.50 mIU/L  Iron, TIBC and Ferritin Panel  Result Value Ref Range   Iron 178 (H) 45 - 160 mcg/dL   TIBC 509 (H) 250 - 450 mcg/dL (calc)   %SAT 35 16 - 45 % (calc)   Ferritin 85 16 - 232 ng/mL  TEST AUTHORIZATION  Result Value Ref Range   TEST NAME: IRON, TIBC AND FERRITIN PANEL    TEST CODE: [id number]    CLIENT CONTACT: Henrene Pastor    REPORT ALWAYS MESSAGE SIGNATURE        Assessment & Plan:   Problem List Items Addressed This Visit      Cardiovascular and Mediastinum   Hypertension Uncontrolled per patient home readings.  Given dizziness will leave HCTZ at same dose.  - Continue HCTZ - Continue work on pain control - Follow-up 3 months.     Other   Fibromyalgia Uncontrolled at this time.  Patient with chronic pain interfering with daily activities. - Continue tizanidine - may take 2 mg 1/2 tab during day if needed. - RESTART gabapentin 100 mg capsules.  Slowly increasto three times daily.  See AVS for instructions. - Follow-up 3 months.   Relevant Medications   tiZANidine (ZANAFLEX) 4 MG tablet   gabapentin (NEURONTIN) 100 MG capsule    Other Visit Diagnoses     Insomnia, unspecified type    -  Primary Chronic, worsening off mirtazapine.  Plan: 1. Continue good sleep hygiene 2. Can consider trazodone in future, agree with neurologist to start. 3. START gabapentin first to help with fibromyalgia.  If not sleeping better with gabapentin, will consider adding trazodone in 4-6 weeks.  Slow transit constipation     Stable and resolving.  Patient with overuse of dulcolax most likely.  May have some abdominal discomfort from miralax.  Plan : 1. Reduce miralax to 1/2 dose daily or 1 dose every other day. 2. START colace 200-250 mg daily for stool softener as needed. 3. Continue dulcolax only if no BM in 2-3 days. 4. Follow-up prn.    Nausea     Chronic and due to vestibular dysfunction.  Continue ondansetron prn.  Optimize potential for gastritis and continue PPI.  Follow-up prn.   Relevant Medications   ondansetron (ZOFRAN) 4 MG tablet      Meds ordered this encounter  Medications  . ondansetron (ZOFRAN) 4 MG tablet    Sig: TAKE 1 TABLET BY MOUTH EVERY 8 HOURS AS NEEDED FOR NAUSEA AND VOMITING    Dispense:  60 tablet    Refill:  0  . gabapentin (NEURONTIN) 100 MG capsule    Sig: Take 1 capsule (100 mg total) by mouth 3 (three) times daily.    Dispense:  90 capsule    Refill:  3    Order Specific Question:   Supervising Provider    Answer:   Olin Hauser [[id number]]   - Time spent in direct consultation with patient via telemedicine about above concerns: 24 minutes  Follow up plan: Return in about 3 months (around 06/23/2019) for hypertension.  [name], DNP, AGPCNP-BC Adult Gerontology Primary Care Nurse Practitioner [facility] 03/24/2019, 9:19 AM

## 2019-03-24 NOTE — Telephone Encounter (Signed)
Pt said after appt  BP was 170/91 ,  5 minutes later BP was 154/93

## 2019-03-30 ENCOUNTER — Other Ambulatory Visit: Payer: Self-pay | Admitting: Nurse Practitioner

## 2019-03-30 DIAGNOSIS — I10 Essential (primary) hypertension: Secondary | ICD-10-CM

## 2019-04-15 ENCOUNTER — Other Ambulatory Visit: Payer: Self-pay | Admitting: Nurse Practitioner

## 2019-04-15 DIAGNOSIS — M797 Fibromyalgia: Secondary | ICD-10-CM

## 2019-04-15 DIAGNOSIS — J321 Chronic frontal sinusitis: Secondary | ICD-10-CM

## 2019-04-17 ENCOUNTER — Other Ambulatory Visit: Payer: Self-pay | Admitting: Nurse Practitioner

## 2019-04-17 DIAGNOSIS — F32A Depression, unspecified: Secondary | ICD-10-CM

## 2019-04-17 DIAGNOSIS — F419 Anxiety disorder, unspecified: Principal | ICD-10-CM

## 2019-04-17 DIAGNOSIS — F329 Major depressive disorder, single episode, unspecified: Secondary | ICD-10-CM

## 2019-05-24 IMAGING — MG MM DIGITAL DIAGNOSTIC UNILAT*R* W/ TOMO W/ CAD
6 series · 6 of 14 positions shown · non-contrast
Comparison: Previous exam(s).

CLINICAL DATA: Callback from screening mammogram for possible mass
right breast

EXAM:
2D DIGITAL DIAGNOSTIC UNILATERAL RIGHT MAMMOGRAM WITH CAD AND
ADJUNCT TOMO

[R MLO]
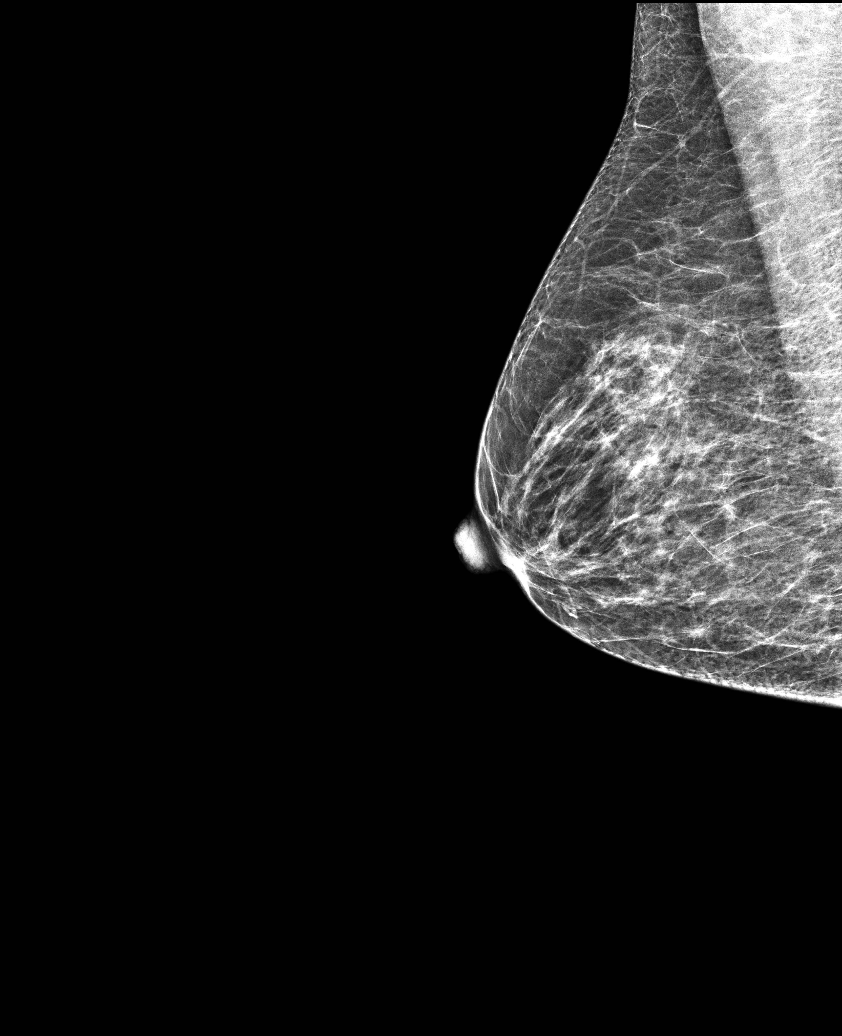

[R MLO synth-2D]
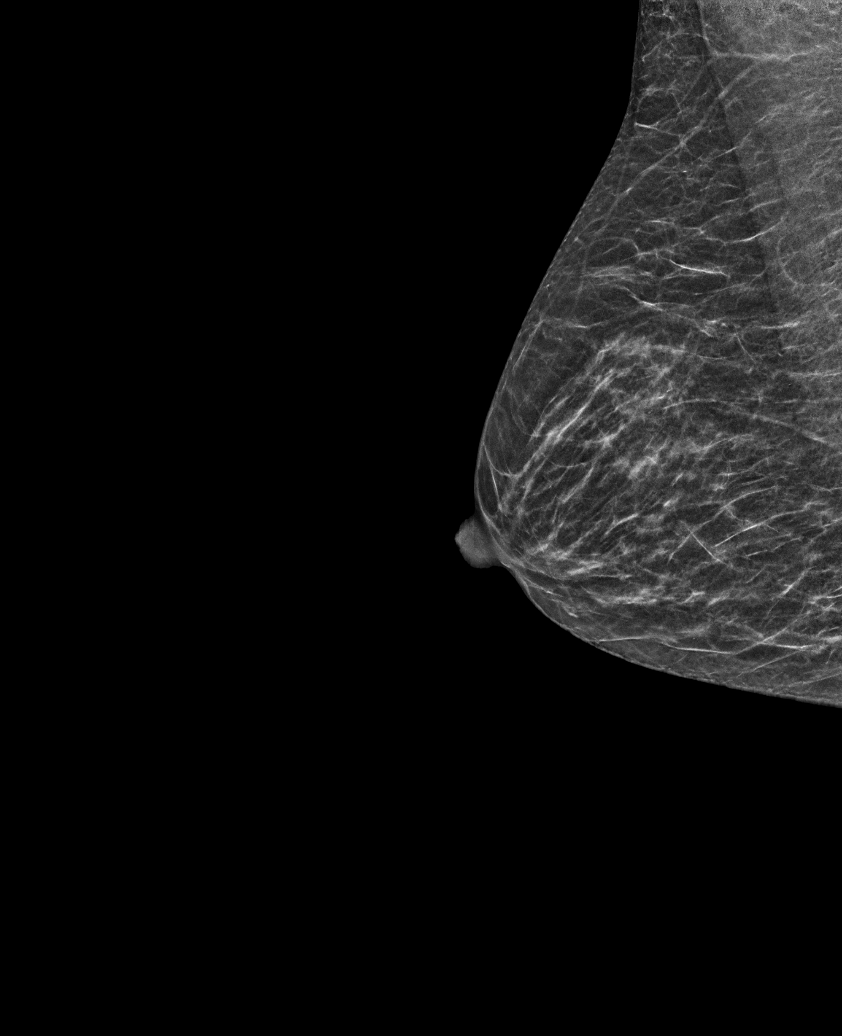

[R CC]
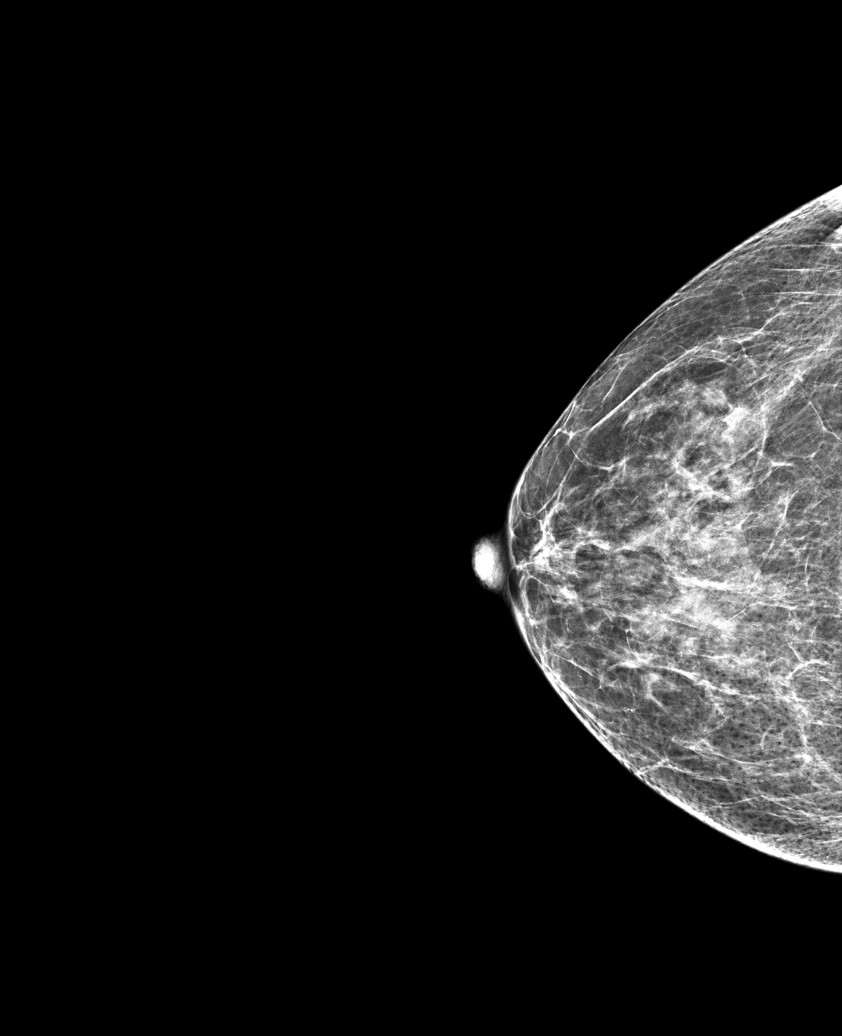

[R CC synth-2D]
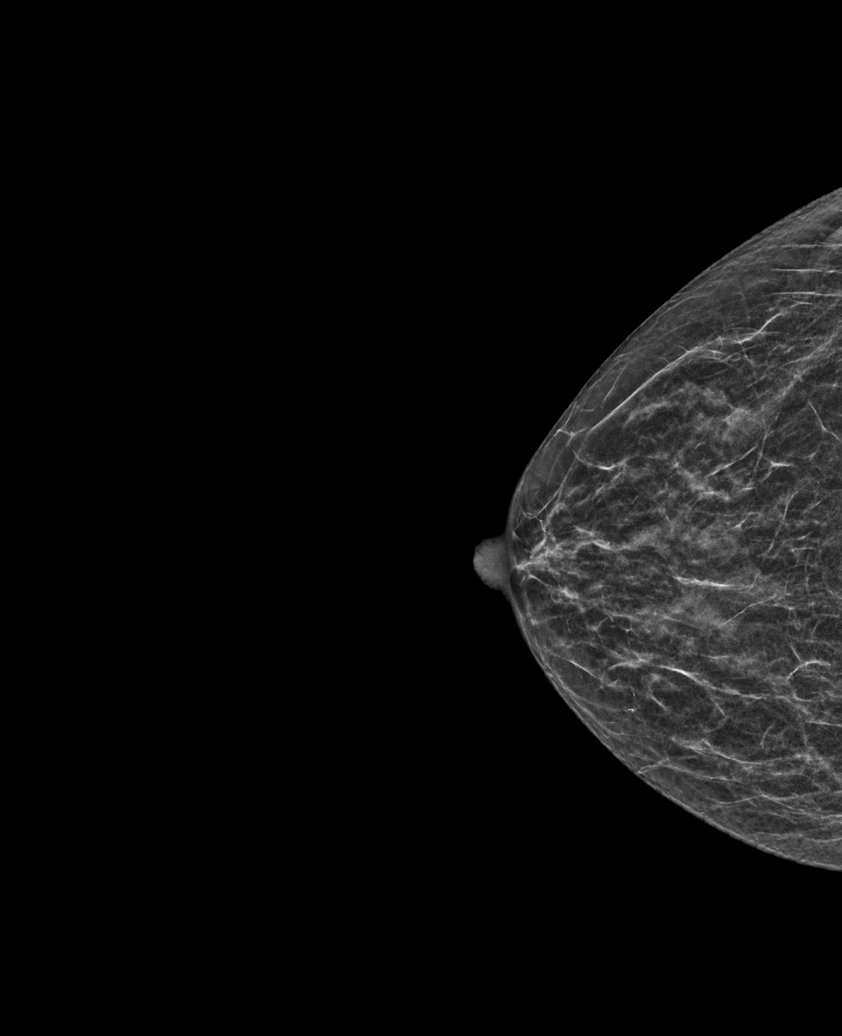

[R CC tomo · tomo slice 21/40.0]
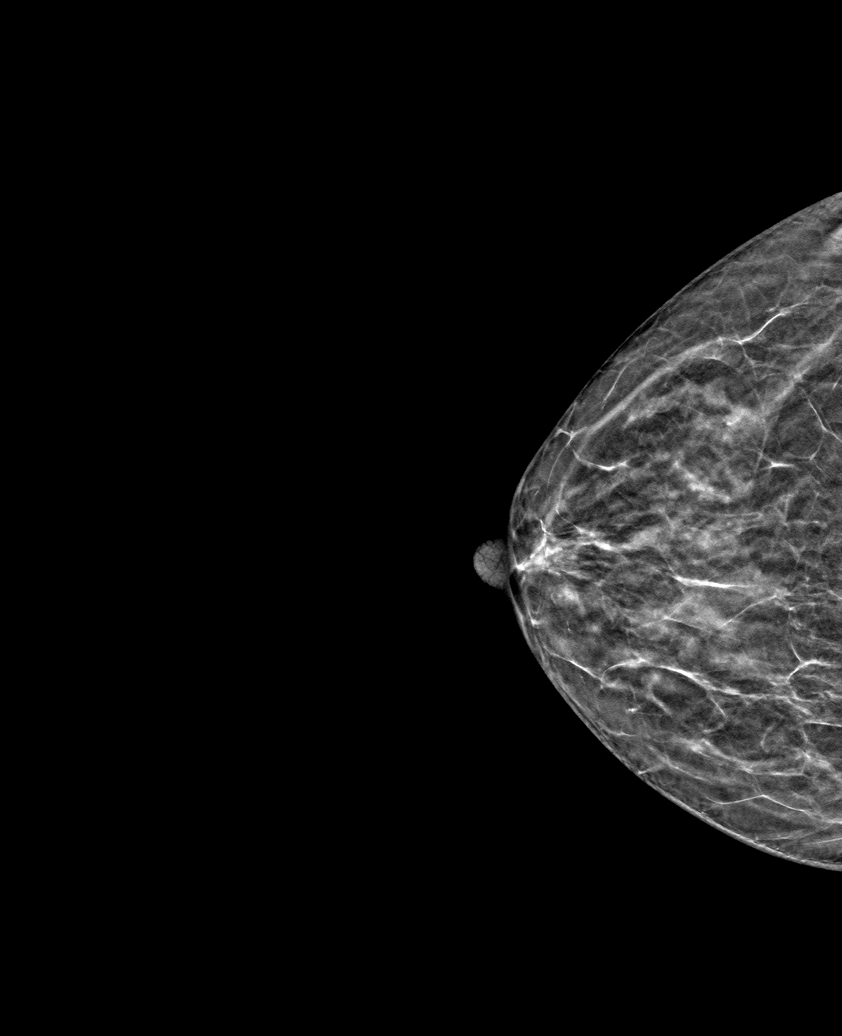

[R MLO tomo · tomo slice 21/41.0]
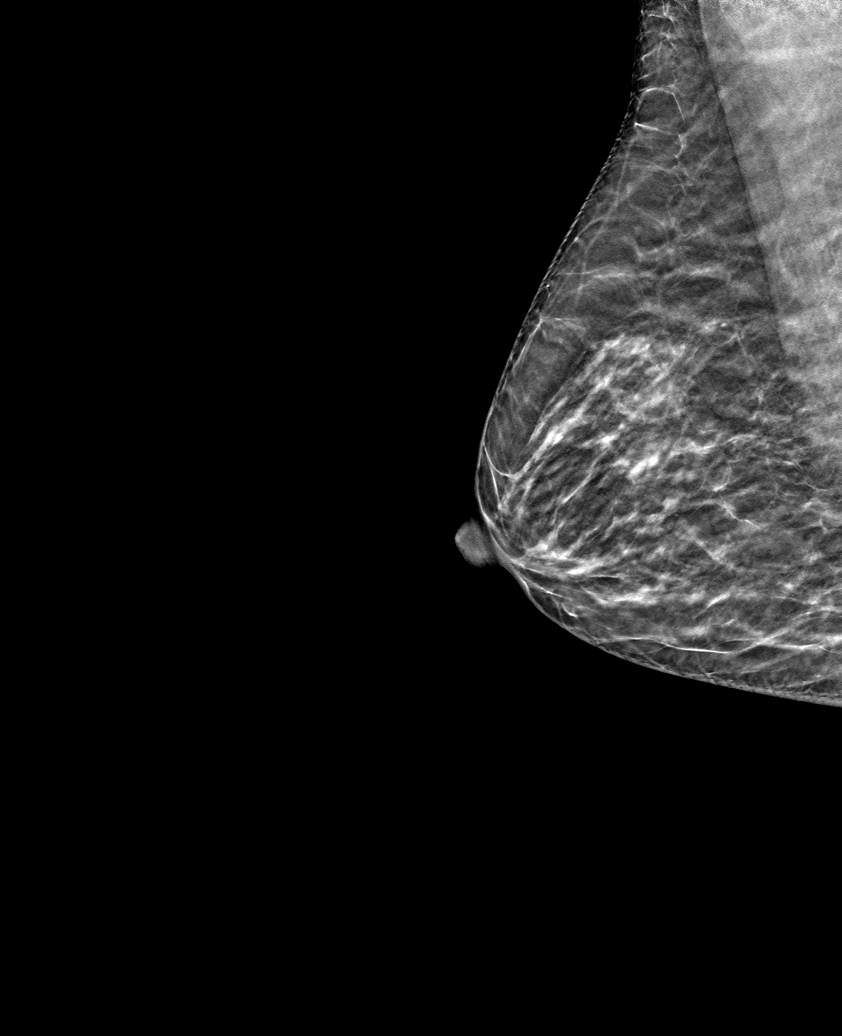

[6 of 14 positions shown; findings below may reference images not displayed]

ACR Breast Density Category b: There are scattered areas of
fibroglandular density.
FINDINGS: Cc and MLO views of the right breast are submitted. Previously
questioned mass does not persist on additional views. No suspicious
abnormalities identified.

Mammographic images were processed with CAD.
IMPRESSION: Negative.

RECOMMENDATION:
Routine screening mammogram back on schedule.

I have discussed the findings and recommendations with the patient.
Results were also provided in writing at the conclusion of the
visit. If applicable, a reminder letter will be sent to the patient
regarding the next appointment.

BI-RADS CATEGORY  1: Negative.

## 2019-06-17 ENCOUNTER — Other Ambulatory Visit: Payer: Self-pay

## 2019-06-17 ENCOUNTER — Encounter: Payer: Self-pay | Admitting: Family Medicine

## 2019-06-17 ENCOUNTER — Ambulatory Visit (INDEPENDENT_AMBULATORY_CARE_PROVIDER_SITE_OTHER): Payer: No Typology Code available for payment source | Admitting: Family Medicine

## 2019-06-17 VITALS — BP 100/60 | HR 68 | Resp 14 | Ht 65.5 in | Wt 126.6 lb

## 2019-06-17 DIAGNOSIS — N949 Unspecified condition associated with female genital organs and menstrual cycle: Secondary | ICD-10-CM

## 2019-06-17 DIAGNOSIS — K219 Gastro-esophageal reflux disease without esophagitis: Secondary | ICD-10-CM | POA: Diagnosis not present

## 2019-06-17 DIAGNOSIS — F419 Anxiety disorder, unspecified: Secondary | ICD-10-CM

## 2019-06-17 DIAGNOSIS — F331 Major depressive disorder, recurrent, moderate: Secondary | ICD-10-CM | POA: Insufficient documentation

## 2019-06-17 DIAGNOSIS — M797 Fibromyalgia: Secondary | ICD-10-CM | POA: Diagnosis not present

## 2019-06-17 DIAGNOSIS — R300 Dysuria: Secondary | ICD-10-CM | POA: Diagnosis not present

## 2019-06-17 DIAGNOSIS — N952 Postmenopausal atrophic vaginitis: Secondary | ICD-10-CM

## 2019-06-17 LAB — POCT URINALYSIS DIPSTICK
Bilirubin, UA: NEGATIVE
Blood, UA: NEGATIVE
Glucose, UA: NEGATIVE
Ketones, UA: NEGATIVE
Leukocytes, UA: NEGATIVE
Nitrite, UA: NEGATIVE
Protein, UA: NEGATIVE
Spec Grav, UA: 1.01 (ref 1.010–1.025)
Urobilinogen, UA: 0.2 E.U./dL
pH, UA: 8 (ref 5.0–8.0)

## 2019-06-17 MED ORDER — SERTRALINE HCL 50 MG PO TABS: 50.0000 mg | ORAL_TABLET | Freq: Every day | ORAL | 2 refills | Status: DC

## 2019-06-17 MED ORDER — PREMARIN 0.625 MG/GM VA CREA: 1.0000 | TOPICAL_CREAM | VAGINAL | 3 refills | Status: DC

## 2019-06-17 MED ORDER — FLUCONAZOLE 150 MG PO TABS: ORAL_TABLET | ORAL | 0 refills | Status: DC

## 2019-06-17 NOTE — Progress Notes (Signed)
Subjective:    Patient ID: Denise Elliott, female    DOB: May 24, 1961, 57 y.o.   MRN: [id number]  Denise Elliott is a 58 y.o. female presenting on 06/17/2019 for Anxiety (not sleeping and heart palpations for 2 months) and Ear Pain (Left)  PCP is [name], AGPCNP-BC - I am currently covering during her maternity leave.   HPI   Accompanied by daughter Mickel Baas for additional history.  Moderate Major Depression Recurrent / Anxiety / Insomnia / Fibromyalgia Followed by PCP, [facility] Dr Manuella Ghazi for similar problems, see prior notes Known mood and anxiety issues related to her pain and fibro and also related to poor sleep, reduced hours of sleep per night. She has tried mirtazapine in past from neuro, and discontinued due to side effect. She has tried Cymbalta in past for pain fibro but ineffective.In past tried Imipramine but had side effect Already on other meds Tizanidine and Gabapentin.  Currently interested in treatment for her mood and anxiety, seems worsening and affecting her sleep. - She is interested in trial on SSRI medication today, family member takes Sertraline and benefits from it. - Has not tried Trazodone, but this was mentioned on chart in past. 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 In past she was on Premarin topical with relief, needs refill. Also asking if she has a UTI. Denies vaginal discharge, feels like a yeast infection   Depression screen [facility] 2/9 06/17/2019 09/22/2018 06/27/2017  Decreased Interest 1 1 0  Down, Depressed, Hopeless 1 1 0  PHQ - 2  Score 2 2 0  Altered sleeping 3 3 0  Tired, decreased energy 3 3 1   Change in appetite 3 3 0  Feeling bad or failure about yourself  0 0 0  Trouble concentrating 3 3 0  Moving slowly or fidgety/restless 0 0 1  Suicidal thoughts 0 0 0  PHQ-9 Score 14 14 2   Difficult doing work/chores Somewhat difficult Not difficult at all -   GAD 7 : Generalized Anxiety Score 12/29/2017 06/27/2017  Nervous, Anxious, on Edge 2 1  Control/stop worrying 2 0  Worry too much - different things 1 0  Trouble relaxing 1 1  Restless 1 0  Easily annoyed or irritable 1 0  Afraid - awful might happen 1 0  Total GAD 7 Score 9 2  Anxiety Difficulty Somewhat difficult Somewhat difficult     Social History   Tobacco Use  . Smoking status: Never Smoker  . Smokeless tobacco: Never Used  Substance Use Topics  . Alcohol use: Yes    Comment: socially  . Drug use: No    Review of Systems Per HPI unless specifically indicated above     Objective:    BP 100/60 (BP Location: Right Arm, Patient Position: Sitting, Cuff Size: Normal)   Pulse 68   Resp 14   Ht 5' 5.5" (1.664 m)   Wt 126 lb 9.6 oz (57.4 kg)   BMI 20.75 kg/m   Wt Readings from Last 3 Encounters:  06/17/19 126 lb 9.6 oz (57.4 kg)  09/22/18 124 lb 9.6 oz (56.5 kg)  05/07/18 115 lb (52.2 kg)    Physical Exam Vitals signs and nursing note reviewed.  Constitutional:      General: She is not in acute distress.    Appearance: She is well-developed. She is not diaphoretic.     Comments: Well-appearing, comfortable, cooperative  HENT:     Head: Normocephalic and atraumatic.  Eyes:     General:        Right eye: No discharge.        Left eye: No discharge.     Conjunctiva/sclera: Conjunctivae normal.  Neck:     Musculoskeletal: Normal range of motion and neck supple.     Thyroid: No thyromegaly.  Cardiovascular:     Rate and Rhythm: Normal rate and regular rhythm.     Heart sounds: Normal heart sounds. No murmur.  Pulmonary:     Effort:  Pulmonary effort is normal. No respiratory distress.     Breath sounds: Normal breath sounds. No wheezing or rales.  Musculoskeletal: Normal range of motion.  Lymphadenopathy:     Cervical: No cervical adenopathy.  Skin:    General: Skin is warm and dry.     Findings: No erythema or rash.  Neurological:     Mental Status: She is alert and oriented to person, place, and time.  Psychiatric:        Behavior: Behavior normal.     Comments: Well groomed, good eye contact, normal speech and thoughts        Results for orders placed or performed in visit on 06/17/19  POCT urinalysis dipstick  Result Value Ref Range   Color, UA Yellow    Clarity, UA Clear    Glucose, UA Negative Negative   Bilirubin, UA neg    Ketones, UA neg    Spec Grav, UA 1.010 1.010 - 1.025   Blood, UA neg    pH, UA 8.0 5.0 - 8.0   Protein, UA Negative Negative   Urobilinogen, UA 0.2 0.2 or 1.0 E.U./dL   Nitrite, UA neg    Leukocytes, UA Negative Negative   Appearance     Odor        Assessment & Plan:   Problem List Items Addressed This Visit    Anxiety - Primary   Relevant Medications   sertraline (ZOLOFT) 50 MG tablet   Fibromyalgia   Relevant Medications   sertraline (ZOLOFT) 50 MG tablet   GERD (gastroesophageal reflux disease)    Other Visit Diagnoses    Dysuria       Relevant Orders   Urine Culture   POCT urinalysis dipstick (Completed)   Vaginal burning       Relevant Medications   fluconazole (DIFLUCAN) 150 MG tablet   Vaginal atrophy       Relevant Medications   conjugated estrogens (PREMARIN) vaginal cream (Start on 06/18/2019)   Check urine dipstick today, result is negative, sent to mychart Check urine culture, pending Hold empiric antibiotic at this time Treat with Diflucan course for likely yeast etiology Also could be more chronic atrophy cause, and can refill premarin topical      Regarding mood / anxiety / fibro / insomnia Trial on SSRI Sertraline 50mg  daily, may  adjust to 75 vs 100 in future, goal to treat generalized symptoms with mood/anxiety and help her insomnia, seems to have a lot more physical manifestations of her anxiety and mood now. - Future offered Trazodone PRN for insomnia, and mood, may need this eventually  Follow-up if not improving,  anticipate return 4-6 weeks to review if med is successful or med adjust  Meds ordered this encounter  Medications  . sertraline (ZOLOFT) 50 MG tablet    Sig: Take 1 tablet (50 mg total) by mouth daily.    Dispense:  30 tablet    Refill:  2  . conjugated estrogens (PREMARIN) vaginal cream    Sig: Place 1 Applicatorful vaginally 3 (three) times a week. Only apply pea-sized amount to external region.    Dispense:  42.5 g    Refill:  3  . fluconazole (DIFLUCAN) 150 MG tablet    Sig: Take one tablet by mouth on Day 1. Repeat dose 2nd tablet on Day 3.    Dispense:  2 tablet    Refill:  0      Follow up plan: Return in about 4 weeks (around 07/15/2019) for Follow-up 4-6 weeks anxiety, mood sleep.  [name], DO [facility] 06/17/2019, 9:37 AM

## 2019-06-17 NOTE — Patient Instructions (Addendum)
Thank you for coming to the office today.  Start Sertraline 50mg  daily in morning with food daily  Takes up to 3-4 weeks to take full effect, but can help sooner  If not improved then next step is add Trazodone 50mg  daily, may help your mood and sleep  Try yeast infection treatment  If not improved may be urinary tract infection - can follow up with the results on urine culture  Follow with neurology as planned   Please schedule a Follow-up Appointment to: Return in about 4 weeks (around 07/15/2019) for Follow-up 4-6 weeks anxiety, mood sleep.  If you have any other questions or concerns, please feel free to call the office or send a message through [place]. You may also schedule an earlier appointment if necessary.  Additionally, you may be receiving a survey about your experience at our office within a few days to 1 week by e-mail or mail. We value your feedback.  [name], DO [place]

## 2019-06-18 LAB — URINE CULTURE
MICRO NUMBER:: 631687
SPECIMEN QUALITY:: ADEQUATE

## 2019-07-09 ENCOUNTER — Other Ambulatory Visit: Payer: Self-pay | Admitting: Family Medicine

## 2019-07-09 ENCOUNTER — Other Ambulatory Visit: Payer: Self-pay | Admitting: Nurse Practitioner

## 2019-07-09 DIAGNOSIS — F419 Anxiety disorder, unspecified: Secondary | ICD-10-CM

## 2019-07-09 DIAGNOSIS — K219 Gastro-esophageal reflux disease without esophagitis: Secondary | ICD-10-CM

## 2019-07-10 ENCOUNTER — Other Ambulatory Visit: Payer: Self-pay | Admitting: Nurse Practitioner

## 2019-07-10 DIAGNOSIS — I10 Essential (primary) hypertension: Secondary | ICD-10-CM

## 2019-07-16 ENCOUNTER — Ambulatory Visit (INDEPENDENT_AMBULATORY_CARE_PROVIDER_SITE_OTHER): Payer: No Typology Code available for payment source | Admitting: Nurse Practitioner

## 2019-07-16 ENCOUNTER — Other Ambulatory Visit: Payer: Self-pay

## 2019-07-16 ENCOUNTER — Encounter: Payer: Self-pay | Admitting: Nurse Practitioner

## 2019-07-16 VITALS — BP 102/61 | HR 66 | Ht 65.5 in | Wt 123.8 lb

## 2019-07-16 DIAGNOSIS — F5104 Psychophysiologic insomnia: Secondary | ICD-10-CM

## 2019-07-16 DIAGNOSIS — R110 Nausea: Secondary | ICD-10-CM

## 2019-07-16 DIAGNOSIS — K219 Gastro-esophageal reflux disease without esophagitis: Secondary | ICD-10-CM

## 2019-07-16 DIAGNOSIS — D751 Secondary polycythemia: Secondary | ICD-10-CM | POA: Diagnosis not present

## 2019-07-16 DIAGNOSIS — E876 Hypokalemia: Secondary | ICD-10-CM | POA: Diagnosis not present

## 2019-07-16 DIAGNOSIS — F419 Anxiety disorder, unspecified: Secondary | ICD-10-CM

## 2019-07-16 DIAGNOSIS — I10 Essential (primary) hypertension: Secondary | ICD-10-CM

## 2019-07-16 MED ORDER — OMEPRAZOLE 20 MG PO CPDR: 20.0000 mg | DELAYED_RELEASE_CAPSULE | Freq: Two times a day (BID) | ORAL | 1 refills | Status: DC

## 2019-07-16 MED ORDER — TRAZODONE HCL 50 MG PO TABS: 25.0000 mg | ORAL_TABLET | Freq: Every evening | ORAL | 3 refills | Status: DC | PRN

## 2019-07-16 MED ORDER — HYDROCHLOROTHIAZIDE 12.5 MG PO TABS: 12.5000 mg | ORAL_TABLET | Freq: Every day | ORAL | 1 refills | Status: DC

## 2019-07-16 MED ORDER — PROMETHAZINE HCL 12.5 MG PO TABS: 12.5000 mg | ORAL_TABLET | Freq: Four times a day (QID) | ORAL | 0 refills | Status: DC | PRN

## 2019-07-16 NOTE — Patient Instructions (Addendum)
Denise Elliott,   Thank you for coming in to clinic today.  1. Omeprazole: Take 30 minutes before breakfast and dinner every night.  Try this for 2 weeks before letting me know how it works.   Goal: no nausea in the night  2. Take 1/2 dose promethazine (12.5 mg as needed up to 3 times daily)  3. If the above are not working for nausea, I recommend GI referral.  Anxiety: Continue sertraline 50 mg once daily START trazodone 25 mg - 50 mg at bedtime.  Please schedule a follow-up appointment with [name], AGNP. Return in about 2 months (around 09/15/2019) for anxiety, nausea.  If you have any other questions or concerns, please feel free to call the clinic or send a message through [place]. You may also schedule an earlier appointment if necessary.  You will receive a survey after today's visit either digitally by e-mail or paper by [employer]. Your experiences and feedback matter to [place].  Please respond so we know how we are doing as we provide care for you.   [name], DNP, AGNP-BC Adult Gerontology Nurse Practitioner [place]

## 2019-07-16 NOTE — Progress Notes (Signed)
Subjective:    Patient ID: Denise Elliott, female    DOB: [birth year], 58 y.o.   MRN: [id number]  Denise Elliott is a 58 y.o. female presenting on 07/16/2019 for Anxiety (pt notice some improvment with her anxiety)   HPI Anxiety  Patient was evaluated by Dr. Raliegh Ip on 06/17/2019 and started sertraline 50 mg once daily.  No known side effects. Sleeps only 3 hours at bedtime. Patient has significant anxiety due to physical symptoms below.  Dizziness Patient still has inner imbalance, tinnitis, nausea, dizziness.  This is problem with the high anxiety. Patient dislikes feeling dizzy.  Also has dizziness with quick position changes.  Nausea Patient reports ongoing nausea that bothers her throughout the day.  Worsens midday and always wakes her in the night.  Taking omeprazole in the night helps symptoms.  Patient takes omeprazole 20 mg once daily around 2 am. - No GI bleeding noted. - Phenergan helps nausea - zofran does not, but patient complains of drowsiness with this med. (25 mg dose) - Patient eats crackers much of the day to help settle her stomach.  GAD 7 : Generalized Anxiety Score 07/16/2019 12/29/2017 06/27/2017  Nervous, Anxious, on Edge 2 2 1   Control/stop worrying 1 2 0  Worry too much - different things 1 1 0  Trouble relaxing 1 1 1   Restless 0 1 0  Easily annoyed or irritable 1 1 0  Afraid - awful might happen 1 1 0  Total GAD 7 Score 7 9 2   Anxiety Difficulty Somewhat difficult Somewhat difficult Somewhat difficult    Depression screen [facility] 2/9 07/16/2019 06/17/2019 09/22/2018 06/27/2017  Decreased Interest 1 1 1  0  Down, Depressed, Hopeless 1 1 1  0  PHQ - 2 Score 2 2 2  0  Altered sleeping 3 3 3  0  Tired, decreased energy 3 3 3 1   Change in appetite 2 3 3  0  Feeling bad or failure about yourself  0 0 0 0  Trouble concentrating 1 3 3  0  Moving slowly or fidgety/restless 0 0 0 1  Suicidal thoughts 0 0 0 0  PHQ-9 Score 11 14 14 2   Difficult doing work/chores Somewhat difficult  Somewhat difficult Not difficult at all -     Social History   Tobacco Use  . Smoking status: Never Smoker  . Smokeless tobacco: Never Used  Substance Use Topics  . Alcohol use: Yes    Comment: socially  . Drug use: No    Review of Systems Per HPI unless specifically indicated above     Objective:    BP 102/61 (BP Location: Left Arm, Patient Position: Sitting, Cuff Size: Small)   Pulse 66   Ht 5' 5.5" (1.664 m)   Wt 123 lb 12.8 oz (56.2 kg)   BMI 20.29 kg/m   Wt Readings from Last 3 Encounters:  07/16/19 123 lb 12.8 oz (56.2 kg)  06/17/19 126 lb 9.6 oz (57.4 kg)  09/22/18 124 lb 9.6 oz (56.5 kg)    Physical Exam Vitals signs reviewed.  Constitutional:      General: She is not in acute distress.    Appearance: She is well-developed.  HENT:     Head: Normocephalic and atraumatic.  Cardiovascular:     Rate and Rhythm: Normal rate and regular rhythm.     Pulses:          Radial pulses are 2+ on the right side and 2+ on the left side.  Posterior tibial pulses are 1+ on the right side and 1+ on the left side.     Heart sounds: Normal heart sounds, S1 normal and S2 normal.  Pulmonary:     Effort: Pulmonary effort is normal. No respiratory distress.     Breath sounds: Normal breath sounds and air entry.  Abdominal:     General: Bowel sounds are normal. There is no distension.     Palpations: Abdomen is soft.     Tenderness: There is abdominal tenderness in the epigastric area. There is guarding (with epigastric pressure).     Hernia: No hernia is present.  Musculoskeletal:     Right lower leg: No edema.     Left lower leg: No edema.  Skin:    General: Skin is warm and dry.     Capillary Refill: Capillary refill takes less than 2 seconds.  Neurological:     Mental Status: She is alert and oriented to person, place, and time.  Psychiatric:        Attention and Perception: Attention normal.        Mood and Affect: Mood and affect normal.        Behavior:  Behavior normal. Behavior is cooperative.    Results for orders placed or performed in visit on 06/17/19  Urine Culture   Specimen: Urine  Result Value Ref Range   MICRO NUMBER: [id number]    SPECIMEN QUALITY: Adequate    Sample Source URINE, CLEAN CATCH    STATUS: FINAL    Result:      Multiple organisms present, each less than 10,000 CFU/mL. These organisms, commonly found on external and internal genitalia, are considered to be colonizers. No further testing performed.  POCT urinalysis dipstick  Result Value Ref Range   Color, UA Yellow    Clarity, UA Clear    Glucose, UA Negative Negative   Bilirubin, UA neg    Ketones, UA neg    Spec Grav, UA 1.010 1.010 - 1.025   Blood, UA neg    pH, UA 8.0 5.0 - 8.0   Protein, UA Negative Negative   Urobilinogen, UA 0.2 0.2 or 1.0 E.U./dL   Nitrite, UA neg    Leukocytes, UA Negative Negative   Appearance     Odor        Assessment & Plan:   Problem List Items Addressed This Visit      Cardiovascular and Mediastinum   Hypertension Controlled, likely over treated with some orthostasis.  REDUCE HCTZ to 12.5 mg once daily.  Continue adequate salt intake, but not too much. Follow-up 6-8 weeks.   Relevant Medications   hydrochlorothiazide (HYDRODIURIL) 12.5 MG tablet     Digestive   GERD (gastroesophageal reflux disease) Uncontrolled Likely gastritis with epigastric tenderness and persistent nausea.  Continue phenergan prn, but reduce dose to 12.5 mg tid prn.  INCREASE omeprazole to 20 mg bid before meals.  Goal is to eliminate night awakening for nausea. Consider future referral to GI if not improving in next 2 weeks. Follow-up 6-8 weeks.   Relevant Medications   omeprazole (PRILOSEC) 20 MG capsule     Other   Anxiety Uncontrolled.  Worsening due to physical symptoms.  Improved on sertraline, but insomnia remains uncontrolled without significant improvement.  Plan: 1. Continue sertraline 50 mg once daily 2. START trazodone 25-50  mg bedtime prn 3. Encouraged non-pharm stress management strategies. 4. Follow-up 6-8 weeks.   Relevant Medications   traZODone (DESYREL) 50 MG tablet  Other Visit Diagnoses    Psychophysiological insomnia    -  Primary See anxiety above   Relevant Medications   traZODone (DESYREL) 50 MG tablet   Polycythemia     Needs labs.  No recheck.  Follow-up after labs prn   Relevant Orders   CBC   Deficiency of potassium     Needs labs.  No recent recheck.  Follow-up after labs prn.   Relevant Orders   Comprehensive Metabolic Panel (CMET)   Nausea     See GERD above.   Relevant Medications   promethazine (PHENERGAN) 12.5 MG tablet      Meds ordered this encounter  Medications  . hydrochlorothiazide (HYDRODIURIL) 12.5 MG tablet    Sig: Take 1 tablet (12.5 mg total) by mouth daily.    Dispense:  90 tablet    Refill:  1    Order Specific Question:   Supervising Provider    Answer:   Olin Hauser [[id number]]  . promethazine (PHENERGAN) 12.5 MG tablet    Sig: Take 1 tablet (12.5 mg total) by mouth every 6 (six) hours as needed for nausea or vomiting.    Dispense:  30 tablet    Refill:  0    Order Specific Question:   Supervising Provider    Answer:   Olin Hauser [[id number]]  . traZODone (DESYREL) 50 MG tablet    Sig: Take 0.5-1 tablets (25-50 mg total) by mouth at bedtime as needed for sleep.    Dispense:  30 tablet    Refill:  3    Order Specific Question:   Supervising Provider    Answer:   Olin Hauser [[id number]]  . omeprazole (PRILOSEC) 20 MG capsule    Sig: Take 1 capsule (20 mg total) by mouth 2 (two) times daily before a meal.    Dispense:  180 capsule    Refill:  1    Order Specific Question:   Supervising Provider    Answer:   Olin Hauser [[id number]]    Follow up plan: Return in about 2 months (around 09/15/2019) for anxiety, nausea.  [name], DNP, AGPCNP-BC Adult Gerontology Primary Care Nurse Practitioner [facility] 07/16/2019, 2:47 PM

## 2019-07-17 LAB — CBC
Hematocrit: 45.4 % (ref 34.0–46.6)
Hemoglobin: 15.2 g/dL (ref 11.1–15.9)
MCH: 31 pg (ref 26.6–33.0)
MCHC: 33.5 g/dL (ref 31.5–35.7)
MCV: 93 fL (ref 79–97)
Platelets: 279 10*3/uL (ref 150–450)
RBC: 4.91 x10E6/uL (ref 3.77–5.28)
RDW: 11.8 % (ref 11.7–15.4)
WBC: 9.8 10*3/uL (ref 3.4–10.8)

## 2019-07-17 LAB — COMPREHENSIVE METABOLIC PANEL
ALT: 13 IU/L (ref 0–32)
AST: 14 IU/L (ref 0–40)
Albumin/Globulin Ratio: 2 (ref 1.2–2.2)
Albumin: 4.5 g/dL (ref 3.8–4.9)
Alkaline Phosphatase: 57 IU/L (ref 39–117)
BUN/Creatinine Ratio: 18 (ref 9–23)
BUN: 17 mg/dL (ref 6–24)
Bilirubin Total: 0.3 mg/dL (ref 0.0–1.2)
CO2: 25 mmol/L (ref 20–29)
Calcium: 9.3 mg/dL (ref 8.7–10.2)
Chloride: 97 mmol/L (ref 96–106)
Creatinine, Ser: 0.93 mg/dL (ref 0.57–1.00)
GFR calc Af Amer: 78 mL/min/{1.73_m2} (ref >59)
GFR calc non Af Amer: 67 mL/min/{1.73_m2} (ref >59)
Globulin, Total: 2.3 g/dL (ref 1.5–4.5)
Glucose: 104 mg/dL — ABNORMAL HIGH (ref 65–99)
Potassium: 3.6 mmol/L (ref 3.5–5.2)
Sodium: 140 mmol/L (ref 134–144)
Total Protein: 6.8 g/dL (ref 6.0–8.5)

## 2019-07-26 ENCOUNTER — Telehealth: Payer: Self-pay | Admitting: Obstetrics and Gynecology

## 2019-07-26 NOTE — Telephone Encounter (Signed)
The patient called and stated that she is having post menopausal bleeding and cramping and is hoping to speak with a nurse as soon as possible. Please advise.

## 2019-07-27 NOTE — Telephone Encounter (Signed)
Pt called no answer unable to LM due to no VM set up.

## 2019-07-30 NOTE — Telephone Encounter (Signed)
Pt has an appt with [facility] 08/03/19

## 2019-08-02 ENCOUNTER — Telehealth: Payer: Self-pay | Admitting: Nurse Practitioner

## 2019-08-02 DIAGNOSIS — R002 Palpitations: Secondary | ICD-10-CM

## 2019-08-02 NOTE — Telephone Encounter (Signed)
-   no longer waking at night with nausea.  Taking omeprazole bid is helping, but still has nausea during the day - Sleeping 6 hours at night with trazodone and omeprazole.  Takes only 25 mg trazodone with good effect.  Stay at lower dose is okay, but may increase to 50 mg prn. - Patient continues having palpitations with slight increase in frequency.  Is interested in completing 24 hr holter monitor.  Ordered for [facility] [place]

## 2019-08-02 NOTE — Telephone Encounter (Signed)
Pt called to give update : she was sleeping better 6 hrs a night no nausea still having palpitation in heart more frequency. She  Subjected the 24 hr  Monitor. Pt. Call back  [phone]

## 2019-08-03 ENCOUNTER — Other Ambulatory Visit: Payer: Self-pay

## 2019-08-03 ENCOUNTER — Encounter: Payer: Self-pay | Admitting: Obstetrics and Gynecology

## 2019-08-03 ENCOUNTER — Ambulatory Visit (INDEPENDENT_AMBULATORY_CARE_PROVIDER_SITE_OTHER): Payer: No Typology Code available for payment source | Admitting: Obstetrics and Gynecology

## 2019-08-03 VITALS — BP 172/114 | HR 67 | Ht 65.5 in | Wt 124.8 lb

## 2019-08-03 DIAGNOSIS — Z79890 Hormone replacement therapy (postmenopausal): Secondary | ICD-10-CM | POA: Diagnosis not present

## 2019-08-03 DIAGNOSIS — N950 Postmenopausal bleeding: Secondary | ICD-10-CM | POA: Diagnosis not present

## 2019-08-03 NOTE — Patient Instructions (Signed)
  Postmenopausal Bleeding  Postmenopausal bleeding is any bleeding that a woman has after she has entered into menopause. Menopause is the end of a woman's fertile years. After menopause, a woman no longer ovulates and does not have menstrual periods. Postmenopausal bleeding may have various causes, including:  Menopausal hormone therapy (MHT).  Endometrial atrophy. After menopause, low estrogen hormone levels cause the membrane that lines the uterus (endometrium) to become thinner. You may have bleeding as the endometrium thins.  Endometrial hyperplasia. This condition is caused by excess estrogen hormones and low levels of progesterone hormones. The excess estrogen causes the endometrium to thicken, which can lead to bleeding. In some cases, this can lead to cancer of the uterus.  Endometrial cancer.  Non-cancerous growths (polyps) on the endometrium, the lining of the uterus, or the cervix.  Uterine fibroids. These are non-cancerous growths in or around the uterus muscle tissue that can cause heavy bleeding. Any type of postmenopausal bleeding, even if it appears to be a typical menstrual period, should be evaluated by your health care provider. Treatment will depend on the cause of the bleeding. Follow these instructions at home:  Pay attention to any changes in your symptoms.  Avoid using tampons and douches as told by your health care provider.  Change your pads regularly.  Get regular pelvic exams and Pap tests.  Take iron supplements as told by your health care provider.  Take over-the-counter and prescription medicines only as told by your health care provider.  Keep all follow-up visits as told by your health care provider. This is important. Contact a health care provider if:  Your bleeding lasts more than 1 week.  You have abdominal pain.  You have bleeding with or after sexual intercourse.  You have bleeding that happens more often than every 3 weeks. Get help  right away if:  You have a fever, chills, headache, dizziness, muscle aches, and bleeding.  You have severe pain with bleeding.  You are passing blood clots.  You have heavy bleeding, need more than 1 pad an hour, and have never experienced this before.  You feel faint. Summary  Postmenopausal bleeding is any bleeding that a woman has after she has entered into menopause.  Postmenopausal bleeding may have various causes. Treatment will depend on the cause of the bleeding.  Any type of postmenopausal bleeding, even if it appears to be a typical menstrual period, should be evaluated by your health care provider.  Be sure to pay attention to any changes in your symptoms and keep all follow-up visits as told by your health care provider. This information is not intended to replace advice given to you by your health care provider. Make sure you discuss any questions you have with your health care provider. Document Released: 03/12/2006 Document Revised: 03/11/2018 Document Reviewed: 02/25/2017 Elsevier Patient Education  2020 Elsevier Inc[employer]  

## 2019-08-03 NOTE — Progress Notes (Signed)
Pt is present today for post-menopausal bleeding. Pt stated that she noticed the bleeding and cramping on July 22, 2019. Pt stated the bleeding lasted for 1 week.

## 2019-08-03 NOTE — Progress Notes (Signed)
GYNECOLOGY CLINIC PROGRESS NOTE Subjective:    Denise Elliott is a 58 y.o. post-menopausal female with a h/o vaginal atrophy and vasomotor symptoms who presents for concerns regarding vaginal bleeding. She has been menopausal for 5 years. Currently on continuous HRT and has been on this regimen for a little over 3 years. Has tried to d/c before with return of her symptomsBleeding is described as less flow than a normal period with cramping and mucoid discharge and has occurred 1 times (August 6-13).  Workup to date: CBC.  Does report that her son was admitted to the hospital right around the time that her bleeding started.  Wonders if stress could have caused her bleeding. Menstrual History: OB History    Gravida  5   Para  3   Term  2   Preterm      AB  2   Living  3     SAB  2   TAB      Ectopic      Multiple      Live Births  3           Menarche age: 67 No LMP recorded. Patient is postmenopausal. Last pap smear 09/09/2017. Results were normal.  Last mammogram 2/18/220: Results were normal.    The following portions of the patient's history were reviewed and updated as appropriate: She  has a past medical history of Allergy, Anxiety, Arthritis, Cervical dystonia, Depression, Fibromyalgia, Fibromyalgia, GERD (gastroesophageal reflux disease), Glaucoma, Goiter, HLD (hyperlipidemia), Hypertension, Laterocollis, Sleep apnea, Sleep apnea, and Sleep apnea.   She  has a past surgical history that includes Tonsillectomy; Gallbladder surgery; Tonsillectomy; Cholecystectomy; and Cosmetic surgery (Left).   Her family history includes AAA (abdominal aortic aneurysm) in her father; Alcohol abuse in her father and paternal grandfather; Colon cancer in her maternal uncle; Colon polyps in her mother; Goiter in her mother; Healthy in her daughter, sister, sister, sister, son, and son; Hypertension in her mother; Liver cancer in her paternal grandfather; Vision loss in her maternal  grandmother.   She  reports that she has never smoked. She has never used smokeless tobacco. She reports current alcohol use. She reports that she does not use drugs.   She has a current medication list which includes the following prescription(s): calcium carbonate, premarin, fluticasone, gabapentin, hydrochlorothiazide, magnesium, fish oil, omeprazole, ondansetron, potassium chloride sa, prempro, promethazine, sertraline, tizanidine, trazodone, triamcinolone ointment, ibuprofen, and meclizine.    Current Outpatient Medications on File Prior to Visit  Medication Sig Dispense Refill  . calcium carbonate (OS-CAL - DOSED IN MG OF ELEMENTAL CALCIUM) 1250 (500 Ca) MG tablet Take 1 tablet by mouth.    . conjugated estrogens (PREMARIN) vaginal cream Place 1 Applicatorful vaginally 3 (three) times a week. Only apply pea-sized amount to external region. 42.5 g 3  . fluticasone (FLONASE) 50 MCG/ACT nasal spray SPRAY 2 SPRAYS INTO EACH NOSTRIL EVERY DAY 48 g 1  . gabapentin (NEURONTIN) 100 MG capsule TAKE 1 CAPSULE BY MOUTH THREE TIMES A DAY 270 capsule 2  . hydrochlorothiazide (HYDRODIURIL) 12.5 MG tablet Take 1 tablet (12.5 mg total) by mouth daily. 90 tablet 1  . Magnesium 250 MG TABS Take 1 mg by mouth 2 (two) times daily.    . Omega-3 Fatty Acids (FISH OIL) 1000 MG CAPS Take 1 capsule by mouth.    Marland Kitchen omeprazole (PRILOSEC) 20 MG capsule Take 1 capsule (20 mg total) by mouth 2 (two) times daily before a meal. 180 capsule 1  .  ondansetron (ZOFRAN) 4 MG tablet TAKE 1 TABLET BY MOUTH EVERY 8 HOURS AS NEEDED FOR NAUSEA AND VOMITING 60 tablet 0  . potassium chloride SA (K-DUR,KLOR-CON) 20 MEQ tablet Take by mouth.    Marland Kitchen PREMPRO 0.45-1.5 MG tablet TAKE 1 TABLET BY MOUTH EVERY DAY 30 tablet 0  . promethazine (PHENERGAN) 12.5 MG tablet Take 1 tablet (12.5 mg total) by mouth every 6 (six) hours as needed for nausea or vomiting. 30 tablet 0  . sertraline (ZOLOFT) 50 MG tablet Take 1 tablet (50 mg total) by mouth  daily. 30 tablet 2  . tiZANidine (ZANAFLEX) 4 MG tablet Take 4 mg by mouth 4 (four) times daily.     . traZODone (DESYREL) 50 MG tablet Take 0.5-1 tablets (25-50 mg total) by mouth at bedtime as needed for sleep. 30 tablet 3  . ibuprofen (ADVIL,MOTRIN) 200 MG tablet Take 200 mg by mouth 3 (three) times daily as needed.    . meclizine (ANTIVERT) 25 MG tablet Take by mouth every 6 (six) hours as needed.     . triamcinolone ointment (KENALOG) 0.1 % Apply topically.     No current facility-administered medications on file prior to visit.     She is allergic to versed [midazolam].   Review of Systems Pertinent items noted in HPI and remainder of comprehensive ROS otherwise negative.    Objective:    BP (!) 172/114   Pulse 67   Ht 5' 5.5" (1.664 m)   Wt 124 lb 12.8 oz (56.6 kg)   BMI 20.45 kg/m  General appearance: alert and no distress Abdomen: soft, non-tender; bowel sounds normal; no masses,  no organomegaly Pelvic: external genitalia normal, rectovaginal septum normal.  Vagina without discharge.  Cervix normal appearing, no lesions and no motion tenderness.  Uterus mobile, nontender, normal shape and size.  Adnexae non-palpable, nontender bilaterally.  Extremities: extremities normal, atraumatic, no cyanosis or edema Neurologic: Grossly normal   Assessment:    Postmenopausal bleeding   Menopausal on HRT.   Plan:   Discussed etiologies of postmenopausal bleeding, concern about precancerous/hyperplasia or cancerous etiology (5 to 10% percent of cases). Also discussed role of unopposed estrogen exposure in leading to thickened or proliferative endometrium; and its possible correlation with endometrial hyperplasia/carcinoma.  Discussed that obesity is linked to endometrial pathology given that adipose cells produce extra estrogen (estrone) which can cause the endometrium to have a significant amount of estrogen exposure.  However, she was reassured that endometrial atrophy and  endometrial polyps are the most common causes of postmenopausal bleeding.  Uterine bleeding in postmenopausal women is usually light and self-limited. Exclusion of cancer is the main objective; therefore, treatment is usually unnecessary once cancer has been excluded.  The primary goal in the diagnostic evaluation of postmenopausal women with uterine bleeding is to exclude malignancy; this can include endometrial biopsy and pelvic ultrasound.   Further diagnostic evaluation is indicated for recurrent or persistent bleeding.  Ordered thyroid panel and pelvic ultrasound. Will perform biopsy based on results.  - Patient notes that she desires to d/c hormones by her birthday in November but has had difficulty in the past.  Tried to quit "cold [place]". Discussed weaning (Cut down to 1/2 tablet or take 1 tablet every other day). Also given info on herbal remedies and non-hormonal prescription medications (Brisdelle, Effexor, Clonidine, Gabapentin).  - RTC in 1 week, for ultrasound and biopsy.    Rubie Maid, MD [facility]

## 2019-08-04 LAB — THYROID PANEL WITH TSH
Free Thyroxine Index: 2.4 (ref 1.2–4.9)
T3 Uptake Ratio: 26 % (ref 24–39)
T4, Total: 9.2 ug/dL (ref 4.5–12.0)
TSH: 1.37 u[IU]/mL (ref 0.450–4.500)

## 2019-08-05 ENCOUNTER — Other Ambulatory Visit: Payer: Self-pay | Admitting: Nurse Practitioner

## 2019-08-05 DIAGNOSIS — I10 Essential (primary) hypertension: Secondary | ICD-10-CM

## 2019-08-05 MED ORDER — HYDROCHLOROTHIAZIDE 12.5 MG PO TABS: 12.5000 mg | ORAL_TABLET | Freq: Every day | ORAL | 1 refills | Status: DC

## 2019-08-07 ENCOUNTER — Other Ambulatory Visit: Payer: Self-pay | Admitting: Nurse Practitioner

## 2019-08-07 DIAGNOSIS — F5104 Psychophysiologic insomnia: Secondary | ICD-10-CM

## 2019-08-07 DIAGNOSIS — F419 Anxiety disorder, unspecified: Secondary | ICD-10-CM

## 2019-08-11 ENCOUNTER — Other Ambulatory Visit: Payer: Self-pay

## 2019-08-11 ENCOUNTER — Ambulatory Visit (INDEPENDENT_AMBULATORY_CARE_PROVIDER_SITE_OTHER): Payer: No Typology Code available for payment source

## 2019-08-11 ENCOUNTER — Other Ambulatory Visit (HOSPITAL_COMMUNITY)
Admission: RE | Admit: 2019-08-11 | Discharge: 2019-08-11 | Disposition: A | Discharge status: Home / Self Care | Payer: BLUE CROSS/BLUE SHIELD | Source: Ambulatory Visit | Attending: Obstetrics and Gynecology | Admitting: Obstetrics and Gynecology

## 2019-08-11 ENCOUNTER — Ambulatory Visit (INDEPENDENT_AMBULATORY_CARE_PROVIDER_SITE_OTHER): Payer: No Typology Code available for payment source | Admitting: Obstetrics and Gynecology

## 2019-08-11 ENCOUNTER — Encounter: Payer: Self-pay | Admitting: Obstetrics and Gynecology

## 2019-08-11 VITALS — BP 157/92 | HR 64 | Ht 65.5 in | Wt 126.4 lb

## 2019-08-11 DIAGNOSIS — N950 Postmenopausal bleeding: Secondary | ICD-10-CM

## 2019-08-11 DIAGNOSIS — Z79890 Hormone replacement therapy (postmenopausal): Secondary | ICD-10-CM | POA: Diagnosis not present

## 2019-08-11 MED ORDER — PREMPRO 0.3-1.5 MG PO TABS: 1.0000 | ORAL_TABLET | Freq: Every day | ORAL | 0 refills | Status: DC

## 2019-08-11 NOTE — Progress Notes (Signed)
Pt is present following ultrasound. Pt stated wanting to discuss HRT and decreasing medication. Pt stated that she is doing well.

## 2019-08-11 NOTE — Patient Instructions (Signed)

## 2019-08-11 NOTE — Progress Notes (Signed)
GYNECOLOGY PROGRESS NOTE  Subjective:    Patient ID: Denise Elliott, female    DOB: [birth year], 58 y.o.   MRN: [id number]  HPI  Patient is a 58 y.o. [phone] female who presents for follow up after ultrasound for post-menopausal bleeding.  Patient also desires to discuss weaning from HRT. Currently on Prempro (0.45mg /1.5 mg), has been on regimen for ~ 3 years.  Has been doing every other day dosing to start weaning process.   The following portions of the patient's history were reviewed and updated as appropriate: allergies, current medications, past family history, past medical history, past social history, past surgical history and problem list.  Review of Systems Pertinent items noted in HPI and remainder of comprehensive ROS otherwise negative.   Objective:   Blood pressure (!) 157/92, pulse 64, height 5' 5.5" (1.664 m), weight 126 lb 6.4 oz (57.3 kg). General appearance: alert and no distress Abdomen: soft, non-tender; bowel sounds normal; no masses,  no organomegaly Pelvic: external genitalia normal, rectovaginal septum normal.  Vagina without discharge.  Cervix normal appearing, no lesions and no motion tenderness.  Uterus mobile, nontender, normal shape and size.  Adnexae non-palpable, nontender bilaterally.  Extremities: extremities normal, atraumatic, no cyanosis or edema Neurologic: Grossly normal   Imaging:  Patient Name: Denise Elliott DOB: 08-19-1961 MRN: [id number] ULTRASOUND REPORT  Location: [facility] Date of Service: 08/11/2019     Indications:Post menopausal bleeding Findings:  The uterus is anteverted and measures 7.2 x 3.2 x 3.4 cm.. Echo texture is homogenous with evidence of focal masses. Within the uterus are multiple suspected fibroids measuring: Fibroid 1:Anterior IM measures 1.2 x 0.9 x 1.2 cm  The Endometrium measures 7 mm.  Right Ovary measures 1.3 x 1.4 x 1.0 cm. It is normal in appearance. Left Ovary measures 1.2 x 1.3  x 1.1 cm. It is normal in appearance. Survey of the adnexa demonstrates no adnexal masses. There is no free fluid in the cul de sac.  Impression: 1. Anterior Im as described above. Otherwise pelvic ultrasound is WNL.  Recommendations: 1.Clinical correlation with the patient's History and Physical Exam.   Jenine M. Albertine Grates    RDMS Assessment:  PMB Menopausal on HRT  Plan:   1. Episode of PMB, with ultrasound noting borderline thickened endometrium (7 mm) and several small fibroids.  Discussed findings with patient. Based on results of ultrasound, would recommend endometrial biopsy.  Biopsy performed today. Will notify patient of results by phone.  2. Patient desires to continue weaning her hormone dosing. Will prescribe lower dose Prempro (0.3 mg/1.5 mg) to take daily for 2 weeks, then can continue to every other day dosing x 1-2 weeks, then increasing 1 additional day each week until more than 5 days between doses and can then discontinue.     Endometrial Biopsy Procedure Note  The patient is positioned on the exam table in the dorsal lithotomy position. Bimanual exam confirms uterine position and size. A Graves speculum is placed into the vagina. A single toothed tenaculum is placed onto the anterior lip of the cervix. The pipette is placed into the endocervical canal and is advanced to the uterine fundus. Using a piston like technique, with vacuum created by withdrawing the stylus, the endometrial specimen is obtained and transferred to the biopsy container. Minimal bleeding is encountered. The procedure is well tolerated.   Uterine Position:mid    Uterine Length: 7 cm   Uterine Specimen: Scant   Post procedure instructions are given. The  patient will follow up by phone.     Rubie Maid, MD [facility]

## 2019-08-16 ENCOUNTER — Encounter: Payer: Self-pay | Admitting: Nurse Practitioner

## 2019-08-19 ENCOUNTER — Ambulatory Visit
Admission: RE | Admit: 2019-08-19 | Discharge: 2019-08-19 | Disposition: A | Discharge status: Home / Self Care | Payer: BLUE CROSS/BLUE SHIELD | Source: Ambulatory Visit | Attending: Nurse Practitioner | Admitting: Nurse Practitioner

## 2019-08-19 ENCOUNTER — Other Ambulatory Visit: Payer: Self-pay

## 2019-08-19 DIAGNOSIS — R002 Palpitations: Secondary | ICD-10-CM

## 2019-08-27 ENCOUNTER — Ambulatory Visit
Admission: RE | Admit: 2019-08-27 | Discharge: 2019-08-27 | Disposition: A | Discharge status: Home / Self Care | Payer: BLUE CROSS/BLUE SHIELD | Source: Ambulatory Visit | Attending: Nurse Practitioner | Admitting: Nurse Practitioner

## 2019-08-27 DIAGNOSIS — R002 Palpitations: Secondary | ICD-10-CM | POA: Insufficient documentation

## 2019-08-30 ENCOUNTER — Encounter: Payer: Self-pay | Admitting: Nurse Practitioner

## 2019-09-06 ENCOUNTER — Other Ambulatory Visit: Payer: Self-pay | Admitting: Family Medicine

## 2019-09-06 DIAGNOSIS — F419 Anxiety disorder, unspecified: Secondary | ICD-10-CM

## 2019-09-17 ENCOUNTER — Other Ambulatory Visit: Payer: Self-pay

## 2019-09-17 ENCOUNTER — Ambulatory Visit (INDEPENDENT_AMBULATORY_CARE_PROVIDER_SITE_OTHER): Payer: No Typology Code available for payment source | Admitting: Nurse Practitioner

## 2019-09-17 ENCOUNTER — Encounter: Payer: Self-pay | Admitting: Nurse Practitioner

## 2019-09-17 VITALS — BP 137/77 | Ht 65.5 in | Wt 126.4 lb

## 2019-09-17 DIAGNOSIS — R1031 Right lower quadrant pain: Secondary | ICD-10-CM | POA: Diagnosis not present

## 2019-09-17 DIAGNOSIS — F419 Anxiety disorder, unspecified: Secondary | ICD-10-CM | POA: Diagnosis not present

## 2019-09-17 DIAGNOSIS — M6289 Other specified disorders of muscle: Secondary | ICD-10-CM | POA: Diagnosis not present

## 2019-09-17 DIAGNOSIS — F5104 Psychophysiologic insomnia: Secondary | ICD-10-CM

## 2019-09-17 DIAGNOSIS — Z23 Encounter for immunization: Secondary | ICD-10-CM | POA: Diagnosis not present

## 2019-09-17 DIAGNOSIS — M791 Myalgia, unspecified site: Secondary | ICD-10-CM

## 2019-09-17 MED ORDER — TRAZODONE HCL 50 MG PO TABS: 50.0000 mg | ORAL_TABLET | Freq: Every evening | ORAL | 1 refills | Status: DC | PRN

## 2019-09-17 MED ORDER — CYCLOBENZAPRINE HCL 5 MG PO TABS: 2.5000 mg | ORAL_TABLET | Freq: Three times a day (TID) | ORAL | 1 refills | Status: DC | PRN

## 2019-09-17 MED ORDER — SERTRALINE HCL 100 MG PO TABS: 100.0000 mg | ORAL_TABLET | Freq: Every day | ORAL | 1 refills | Status: DC

## 2019-09-17 MED ORDER — SUCRALFATE 1 G PO TABS: 1.0000 g | ORAL_TABLET | Freq: Three times a day (TID) | ORAL | 0 refills | Status: DC

## 2019-09-17 NOTE — Patient Instructions (Addendum)
Denise Elliott,   Thank you for coming in to clinic today.  1. Muscle pain STOP tizanidine - consider resuming if new muscle relaxer not effective. START cyclobenzaprine (Flexeril) 5 mg tablet.  Take 1/2 -1 tablet three times daily AS NEEDED for muscle spasm.  Start at bedtime.  It will cause drowsiness.  Anxiety and sleep Increase sertraline to 100 mg daily. Continue trazodone.  Stomach Continue omperazole START sucralfate with meals and at bedtime for 14 days.  Repeat x 1 if needed.  STOP promethazine (Phergan) Continue ondansetron only AS NEEDED for nausea.  Pain and feet Consider inflammatory muscle/connective tissue disorder workup.  Patient to call if she desires this. - Podiatry is another option if needed.   Please schedule a follow-up appointment.  Return in about 6 weeks (around 10/29/2019) for depression.  If you have any other questions or concerns, please feel free to call the clinic or send a message through [place]. You may also schedule an earlier appointment if necessary.  You will receive a survey after today's visit either digitally by e-mail or paper by [employer]. Your experiences and feedback matter to [place].  Please respond so we know how we are doing as we provide care for you.  [name], DNP, AGNP-BC Adult Gerontology Nurse Practitioner [facility], [place] have plantar fasciitis.  Plantar fasciitis is a painful foot condition that affects the heel. It occurs when the band of tissue that connects the toes to the heel bone (plantar fascia) becomes irritated. This can happen after exercising too much or doing other repetitive activities (overuse injury). The pain from plantar fasciitis can range from mild irritation to severe pain that makes it difficult for you to walk or move. The pain is usually worse in the morning or after you have been sitting or lying down for a while.  This condition may be caused by:  Standing for long periods  of time.  Wearing shoes that do not fit.  Doing high-impact activities, including running, aerobics, and ballet.  Being overweight.  Having an abnormal way of walking (gait).  Having tight calf muscles.  Having high arches in your feet.  Starting a new athletic activity.  Follow these instructions at home:  Roll the bottom of your foot over a bag of ice or a bottle of cold water. Do this for 20 minutes, 3-4 times a day.  Wear supportive shoes at all times, even when walking in your own home.  Wearing athletic shoes with air-sole or gel-sole cushions or soft shoe inserts may be most helpful.  Perform exercises below:  Start with 1 or 2 of these exercises that you are most comfortable with. Do not do any exercises that cause you significant worsening pain. Some of these may cause some "stretching soreness" but it should go away after you stop the exercise, and get better over time. Gradually increase up to 3-4 exercises as tolerated.  You may begin exercising the muscles of your foot right away by gently stretching them as follows:  Stretching: Towel stretch: Sit on a hard surface with your injured leg stretched out in front of you. Loop a towel around the ball of your foot and pull the towel toward your body keeping your knee straight. Hold this position for 15 to 30 seconds then relax. Repeat 3 times. When the towel stretch becomes to easy, you may begin doing the standing calf stretch.  Standing calf stretch: Facing a wall, put your hands  against the wall at about eye level. Keep the injured leg back, the uninjured leg forward, and the heel of your injured leg on the floor. Turn your injured foot slightly inward (as if you were pigeon-toed) as you slowly lean into the wall until you feel a stretch in the back of your calf. Hold for 15 to 30 seconds. Repeat 3 times. Do this exercise several times each day. When you can stand comfortably on your injured foot, you can begin stretching  the bottom of your foot using the plantar fascia stretch.  Plantar fascia stretch: Stand with the ball of your injured foot on a stair. Reach for the bottom step with your heel until you feel a stretch in the arch of your foot. Hold this position for 15 to 30 seconds and then relax. Repeat 3 times. After you have stretched the bottom muscles of your foot, you can begin strengthening the top muscles of your foot.  Frozen can roll: Roll your bare injured foot back and forth from your heel to your mid-arch over a frozen juice can. Repeat for 3 to 5 minutes. This exercise is particularly helpful if done first thing in the morning.  Towel pickup: With your heel on the ground, pick up a towel with your toes. Release. Repeat 10 to 20 times. When this gets easy, add more resistance by placing a book or small weight on the towel.  Static and dynamic balance exercises: Place a chair next to your non-injured leg and stand upright. (This will provide you with balance if needed.) Stand on your injured foot. Try to raise the arch of your foot while keeping your toes on the floor. Try to maintain this position and balance on your injured side for 30 seconds. This exercise can be made more difficult by doing it on a piece of foam or a pillow, or with your eyes closed.  Stand in the same position as above. Keep your foot in this position and reach forward in front of you with your injured side's hand, allowing your knee to bend. Repeat this 10 times while maintaining the arch height. This exercise can be made more difficult by reaching farther in front of you. Do 2 sets.  Stand in the same position as above. While maintaining your arch height, reach the injured side's hand across your body toward the chair. The farther you reach, the more challenging the exercise. Do 2 sets of 10.  Next, you can begin strengthening the muscles of your foot and lower leg by using elastic tubing.  Strengthening: Resisted  dorsiflexion: Sit with your injured leg out straight and your foot facing a doorway. Tie a loop in one end of the tubing. Put your foot through the loop so that the tubing goes around the arch of your foot. Tie a knot in the other end of the tubing and shut the knot in the door. Move backward until there is tension in the tubing. Keeping your knee straight, pull your foot toward your body, stretching the tubing. Slowly return to the starting position. Do 3 sets of 10.  Resisted plantar flexion: Sit with your leg outstretched and loop the middle section of the tubing around the ball of your foot. Hold the ends of the tubing in both hands. Gently press the ball of your foot down and point your toes, stretching the tubing. Return to the starting position. Do 3 sets of 10.  Resisted inversion: Sit with your legs out straight and cross  your uninjured leg over your injured ankle. Wrap the tubing around the ball of your injured foot and then loop it around your uninjured foot so that the tubing is anchored there at one end. Hold the other end of the tubing in your hand. Turn your injured foot inward and upward. This will stretch the tubing. Return to the starting position. Do 3 sets of 10.  Resisted eversion: Sit with both legs stretched out in front of you, with your feet about a shoulder's width apart. Tie a loop in one end of the tubing. Put your injured foot through the loop so that the tubing goes around the arch of that foot and wraps around the outside of the uninjured foot. Hold onto the other end of the tubing with your hand to provide tension. Turn your injured foot up and out. Make sure you keep your uninjured foot still so that it will allow the tubing to stretch as you move your injured foot. Return to the starting position. Do 3 sets of 10.

## 2019-09-17 NOTE — Progress Notes (Signed)
Subjective:    Patient ID: Denise Elliott, female    DOB: [birth year], 58 y.o.   MRN: [id number]  Denise Elliott is a 58 y.o. female presenting on 09/17/2019 for Anxiety (pt complains of a lot pressure in her head  tha tshe feel some relief when she close her eyes.), Nausea (RLQ abdominal pain that happens after eating x 2 mths), and toes cross (pt state her great toe and crosses over top of the next toe  x1 mth This happen when she sit and lay down. She also complains of cold feet. )   HPI  Anxiety - Patient has driven a few times - Is cleaning, cooking.  Feels like doing more, pushing herself some. - Focusing less on physical symptoms when she can - Started hobby - sertraline 50 mg once daily and tolerating well - Still has some crying due to physical symptoms. - Sleep is also much improved with trazodone.   Last 2 nights woke at 2-3 am with muscle pain.  Patient used heating packs on thighs.  Nausea About the same.  Heartburn is much improved.  When she wakes the stomach pain is much less.  Takes morning and before dinner.   - ondansetron makes her sleepy, so only take when necessary - RLQ abdominal pain after meals.  Starts and is sudden jab and lasts 10/10 pain.  Occurs 5 x per week.   Stopped prempro - only having night sweats, but no daytime hot flashes.  Left foot pain Feels like marbles in left heel.  Wears shoes in hous.  Ligament on bottom had "bumps" last summer when wearing sandals.  Social History   Tobacco Use  . Smoking status: Never Smoker  . Smokeless tobacco: Never Used  Substance Use Topics  . Alcohol use: Yes    Comment: socially  . Drug use: No    Review of Systems Per HPI unless specifically indicated above     Objective:    BP 137/77 (BP Location: Right Arm, Patient Position: Sitting, Cuff Size: Normal)   Ht 5' 5.5" (1.664 m)   Wt 126 lb 6.4 oz (57.3 kg)   BMI 20.71 kg/m   Wt Readings from Last 3 Encounters:  09/17/19 126 lb 6.4 oz (57.3  kg)  08/11/19 126 lb 6.4 oz (57.3 kg)  08/03/19 124 lb 12.8 oz (56.6 kg)    Physical Exam Vitals signs reviewed.  Constitutional:      General: She is not in acute distress.    Appearance: She is well-developed.  HENT:     Head: Normocephalic and atraumatic.  Cardiovascular:     Rate and Rhythm: Normal rate and regular rhythm.     Pulses:          Radial pulses are 2+ on the right side and 2+ on the left side.       Posterior tibial pulses are 1+ on the right side and 1+ on the left side.     Heart sounds: Normal heart sounds, S1 normal and S2 normal.  Pulmonary:     Effort: Pulmonary effort is normal. No respiratory distress.     Breath sounds: Normal breath sounds and air entry.  Musculoskeletal:     Right lower leg: No edema.     Left lower leg: No edema.  Skin:    General: Skin is warm and dry.     Capillary Refill: Capillary refill takes less than 2 seconds.  Neurological:     Mental Status:  She is alert and oriented to person, place, and time.  Psychiatric:        Attention and Perception: Attention normal.        Mood and Affect: Affect normal. Mood is anxious.        Speech: Speech normal.        Behavior: Behavior normal. Behavior is cooperative.        Thought Content: Thought content normal.        Judgment: Judgment normal.    Results for orders placed or performed in visit on 08/03/19  Thyroid Panel With TSH  Result Value Ref Range   TSH 1.370 0.450 - 4.500 uIU/mL   T4, Total 9.2 4.5 - 12.0 ug/dL   T3 Uptake Ratio 26 24 - 39 %   Free Thyroxine Index 2.4 1.2 - 4.9      Assessment & Plan:   Problem List Items Addressed This Visit      Other   Anxiety   Relevant Medications   sertraline (ZOLOFT) 100 MG tablet   traZODone (DESYREL) 50 MG tablet    Other Visit Diagnoses    Needs flu shot    -  Primary   Relevant Orders   Flu Vaccine QUAD 6+ mos PF IM (Fluarix Quad PF) (Completed)   Psychophysiological insomnia       Relevant Medications    traZODone (DESYREL) 50 MG tablet   Right lower quadrant abdominal pain       Relevant Medications   sucralfate (CARAFATE) 1 g tablet   Muscle hypertonia       Relevant Medications   cyclobenzaprine (FLEXERIL) 5 MG tablet   Myalgia       Relevant Orders   Sed Rate (ESR)   C-reactive protein   ANA Direct w/Reflex if Positive      No orders of the defined types were placed in this encounter.  Muscle spasm, myalgia, hypertonia Remains uncontrolled.  Less benefit currently with tizanidine than in past.  -STOP tizanidine - consider resuming if new muscle relaxer not effective -START cyclobenzaprine (Flexeril) 5 mg tablet.  Take 1/2 -1 tablet three times daily AS NEEDED for muscle spasm.  Start at bedtime.  It will cause drowsiness. - Consider inflammatory muscle/connective tissue disorder workup.  Patient to call if she desires this. - Consider reimaging of brain- ? Auditory neuroma or other ear concerns.  Anxiety Improved, but remains uncontrolled.  Tolerating med well. - Increase sertraline to 100 mg daily. - Continue trazodone  Gerd Improving, but patient continues to have mild abdominal pain.  - Continue omperazole - START sucralfate with meals and at bedtime for 14 days.  Repeat x 1 if needed.  - STOP promethazine (Phergan) - Continue ondansetron only AS NEEDED for nausea.   Follow up plan: Return in about 6 weeks (around 10/29/2019) for depression.  [name], DNP, AGPCNP-BC Adult Gerontology Primary Care Nurse Practitioner [facility] 09/17/2019, 3:55 PM

## 2019-09-20 ENCOUNTER — Encounter: Payer: Self-pay | Admitting: Nurse Practitioner

## 2019-09-25 LAB — ANA SCREEN,IFA,REFLEX TITER/PATTERN,REFLEX MPLX 11 AB CASCADE
14-3-3 eta Protein: <0.2 ng/mL (ref <0.2)
Anti Nuclear Antibody (ANA): NEGATIVE
Cyclic Citrullin Peptide Ab: <16 UNITS
Rheumatoid fact SerPl-aCnc: <14 IU/mL (ref <14)

## 2019-09-25 LAB — CBC
HCT: 44.6 % (ref 35.0–45.0)
Hemoglobin: 14.7 g/dL (ref 11.7–15.5)
MCH: 30.3 pg (ref 27.0–33.0)
MCHC: 33 g/dL (ref 32.0–36.0)
MCV: 92 fL (ref 80.0–100.0)
MPV: 10.2 fL (ref 7.5–12.5)
Platelets: 227 10*3/uL (ref 140–400)
RBC: 4.85 10*6/uL (ref 3.80–5.10)
RDW: 11.8 % (ref 11.0–15.0)
WBC: 6.5 10*3/uL (ref 3.8–10.8)

## 2019-09-25 LAB — COMPREHENSIVE METABOLIC PANEL
AG Ratio: 1.9 (calc) (ref 1.0–2.5)
ALT: 17 U/L (ref 6–29)
AST: 18 U/L (ref 10–35)
Albumin: 4.2 g/dL (ref 3.6–5.1)
Alkaline phosphatase (APISO): 65 U/L (ref 37–153)
BUN: 15 mg/dL (ref 7–25)
CO2: 29 mmol/L (ref 20–32)
Calcium: 9.3 mg/dL (ref 8.6–10.4)
Chloride: 99 mmol/L (ref 98–110)
Creat: 0.97 mg/dL (ref 0.50–1.05)
Globulin: 2.2 g/dL (calc) (ref 1.9–3.7)
Glucose, Bld: 76 mg/dL (ref 65–139)
Potassium: 4.6 mmol/L (ref 3.5–5.3)
Sodium: 138 mmol/L (ref 135–146)
Total Bilirubin: 0.4 mg/dL (ref 0.2–1.2)
Total Protein: 6.4 g/dL (ref 6.1–8.1)

## 2019-09-25 LAB — C-REACTIVE PROTEIN: CRP: 0.7 mg/L (ref <8.0)

## 2019-09-25 LAB — SEDIMENTATION RATE: Sed Rate: 2 mm/h (ref 0–30)

## 2019-09-27 ENCOUNTER — Telehealth: Payer: Self-pay

## 2019-09-27 NOTE — Telephone Encounter (Signed)
Pt called and left a message on the vm stating she needed a refill of her Flexeril. I called the pt back, no answer on home, LMOM. The cellphone, no answer and vm not setup. The prescription the pt is requesting was sent over to her pharmacy on 09/17/2019.

## 2019-10-08 ENCOUNTER — Other Ambulatory Visit: Payer: Self-pay | Admitting: Family Medicine

## 2019-10-08 DIAGNOSIS — J321 Chronic frontal sinusitis: Secondary | ICD-10-CM

## 2019-10-11 ENCOUNTER — Other Ambulatory Visit: Payer: Self-pay | Admitting: Nurse Practitioner

## 2019-10-11 DIAGNOSIS — M6289 Other specified disorders of muscle: Secondary | ICD-10-CM

## 2019-10-12 ENCOUNTER — Encounter: Payer: Self-pay | Admitting: Nurse Practitioner

## 2019-10-19 ENCOUNTER — Other Ambulatory Visit: Payer: Self-pay | Admitting: Nurse Practitioner

## 2019-10-19 DIAGNOSIS — R1031 Right lower quadrant pain: Secondary | ICD-10-CM

## 2019-10-26 ENCOUNTER — Other Ambulatory Visit: Payer: Self-pay | Admitting: Obstetrics and Gynecology

## 2019-10-27 NOTE — Telephone Encounter (Signed)
It seems that another provider recently discontinued this prescription.  Please see if the patient is still desiring this prescription or if this was a pharmacy automatic refill request.

## 2019-10-28 NOTE — Telephone Encounter (Signed)
Spoke to pt and she stated that she was no longer taking the medication prempro and did not need a refill

## 2019-10-29 ENCOUNTER — Ambulatory Visit: Payer: Self-pay | Admitting: Nurse Practitioner

## 2020-01-06 ENCOUNTER — Other Ambulatory Visit: Payer: Self-pay

## 2020-01-06 ENCOUNTER — Encounter: Payer: Self-pay | Admitting: Family Medicine

## 2020-01-06 ENCOUNTER — Ambulatory Visit (INDEPENDENT_AMBULATORY_CARE_PROVIDER_SITE_OTHER): Payer: No Typology Code available for payment source | Admitting: Family Medicine

## 2020-01-06 VITALS — BP 135/88 | HR 72 | Temp 97.5°F | Resp 16 | Ht 65.5 in | Wt 122.0 lb

## 2020-01-06 DIAGNOSIS — M67471 Ganglion, right ankle and foot: Secondary | ICD-10-CM

## 2020-01-06 DIAGNOSIS — I10 Essential (primary) hypertension: Secondary | ICD-10-CM | POA: Diagnosis not present

## 2020-01-06 DIAGNOSIS — J3089 Other allergic rhinitis: Secondary | ICD-10-CM

## 2020-01-06 DIAGNOSIS — M797 Fibromyalgia: Secondary | ICD-10-CM | POA: Diagnosis not present

## 2020-01-06 DIAGNOSIS — F419 Anxiety disorder, unspecified: Secondary | ICD-10-CM

## 2020-01-06 MED ORDER — SERTRALINE HCL 100 MG PO TABS: 100.0000 mg | ORAL_TABLET | Freq: Every day | ORAL | 1 refills | Status: DC

## 2020-01-06 MED ORDER — IPRATROPIUM BROMIDE 0.06 % NA SOLN: 2.0000 | Freq: Four times a day (QID) | NASAL | 0 refills | Status: DC

## 2020-01-06 MED ORDER — MUPIROCIN 2 % EX OINT: 1.0000 "application " | TOPICAL_OINTMENT | Freq: Two times a day (BID) | CUTANEOUS | 0 refills | Status: DC

## 2020-01-06 MED ORDER — HYDROCHLOROTHIAZIDE 12.5 MG PO TABS: 12.5000 mg | ORAL_TABLET | Freq: Every day | ORAL | 1 refills | Status: DC

## 2020-01-06 MED ORDER — GABAPENTIN 100 MG PO CAPS: ORAL_CAPSULE | ORAL | 1 refills | Status: DC

## 2020-01-06 NOTE — Patient Instructions (Addendum)
Thank you for coming to the office today.  [facility] Address: [address] [place], [place] [address] Hours: Open 8AM-5PM Phone: [phone]  rx ordered, use topical antibiotic on toe, most likely cyst or other issue caused by direct pressure, try to off load with a corn pad or foam cushion can tape as well to support toe  Start Atrovent nasal spray decongestant 2 sprays in each nostril up to 4 times daily for 7 days  Start nasal steroid Flonase 2 sprays in each nostril daily for 4-6 weeks, may repeat course seasonally or as needed   Please schedule a Follow-up Appointment to: Return if symptoms worsen or fail to improve.  If you have any other questions or concerns, please feel free to call the office or send a message through [place]. You may also schedule an earlier appointment if necessary.  Additionally, you may be receiving a survey about your experience at our office within a few days to 1 week by e-mail or mail. We value your feedback.  [name], DO [place]

## 2020-01-06 NOTE — Progress Notes (Signed)
Subjective:    Patient ID: Denise Elliott, female    DOB: 1961-12-08, 59 y.o.   MRN: [id number]  Denise Elliott is a 59 y.o. female presenting on 01/06/2020 for swollen toe (ankle pain, heel pain on right foot onset 2 and half month not improved with heat and icing), Ear Pain (left ear ), and Rash (onset 6 month founfd out allergic to grapes, )   HPI   Right Toe Cyst / Pain swelling, middle toe Reports 2 month noticed worsening red swollen spot dorsal end of R middle toe, thought it was pimple she used heat and tried to drain it but it did not drain, causes pain and sore and swelling. She has some chronic foot/ankle pain as well in past. She has not seen podiatry, not used any medication for it. - She used ice and heat as needed - Additionally she has reported in past, she has issue with toes crossing with abnormal positioning  History of varicose veins  Additional history Chronic hip pain and other joint pain  Anxiety Needs refill on Zoloft, 100mg  doing better on this medicine now  Fibromyalgia - needs refill on Gabapentin today  HTN - needs refill on HCTZ  Left Ear Pain, sinusitis Admits sinus allergies, dust as well, has had recent few days with sinus inflammation and pressure. Some congestion, no longer using flonase had some side effect but still has can try again. Using Neti pot as needed to flush sinuses. Admits chronic ear pain in past has seen ENT already has had MRI in past   Depression screen [facility] 2/9 07/16/2019 06/17/2019 09/22/2018  Decreased Interest 1 1 1   Down, Depressed, Hopeless 1 1 1   PHQ - 2 Score 2 2 2   Altered sleeping 3 3 3   Tired, decreased energy 3 3 3   Change in appetite 2 3 3   Feeling bad or failure about yourself  0 0 0  Trouble concentrating 1 3 3   Moving slowly or fidgety/restless 0 0 0  Suicidal thoughts 0 0 0  PHQ-9 Score 11 14 14   Difficult doing work/chores Somewhat difficult Somewhat difficult Not difficult at all    Social History    Tobacco Use  . Smoking status: Never Smoker  . Smokeless tobacco: Never Used  Substance Use Topics  . Alcohol use: Not Currently    Comment: socially  . Drug use: No    Review of Systems Per HPI unless specifically indicated above     Objective:    BP 135/88   Pulse 72   Temp (!) 97.5 F (36.4 C) (Oral)   Resp 16   Ht 5' 5.5" (1.664 m)   Wt 122 lb (55.3 kg)   BMI 19.99 kg/m   Wt Readings from Last 3 Encounters:  01/06/20 122 lb (55.3 kg)  09/17/19 126 lb 6.4 oz (57.3 kg)  08/11/19 126 lb 6.4 oz (57.3 kg)    Physical Exam Vitals and nursing note reviewed.  Constitutional:      General: She is not in acute distress.    Appearance: She is well-developed. She is not diaphoretic.     Comments: Well-appearing, comfortable, cooperative  HENT:     Head: Normocephalic and atraumatic.     Right Ear: Ear canal and external ear normal.     Left Ear: Ear canal and external ear normal.     Ears:     Comments: Bilateral TM symmetrical with some slight fullness of TM without erythema, bulging, or effusion. Eyes:  General:        Right eye: No discharge.        Left eye: No discharge.     Conjunctiva/sclera: Conjunctivae normal.  Cardiovascular:     Rate and Rhythm: Normal rate.  Pulmonary:     Effort: Pulmonary effort is normal.  Musculoskeletal:     Comments: R Foot Middle 3rd toe distal dorsal aspect mild erythema localized and area of 1 mm or so small soft but palpable fluctuant nodule over joint seems to be cystlike structure, no ulceration no drainage, mild tender  Skin:    General: Skin is warm and dry.     Findings: No erythema or rash.     Comments: Varicose veins LE  Neurological:     Mental Status: She is alert and oriented to person, place, and time.  Psychiatric:        Behavior: Behavior normal.     Comments: Well groomed, good eye contact, normal speech and thoughts    Results for orders placed or performed in visit on 08/03/19  Thyroid Panel With  TSH  Result Value Ref Range   TSH 1.370 0.450 - 4.500 uIU/mL   T4, Total 9.2 4.5 - 12.0 ug/dL   T3 Uptake Ratio 26 24 - 39 %   Free Thyroxine Index 2.4 1.2 - 4.9      Assessment & Plan:   Problem List Items Addressed This Visit    Hypertension Controlled Refilled HCTZ continue current course    Relevant Medications   hydrochlorothiazide (HYDRODIURIL) 12.5 MG tablet   Fibromyalgia Stable chronic problem Some episodic joint pains, hip, hands, feet/ankles Refill Gabapentin, SSRI   Relevant Medications   gabapentin (NEURONTIN) 100 MG capsule   sertraline (ZOLOFT) 100 MG tablet   Anxiety Improved on higher dose Sertraline, refill 100mg    Relevant Medications   sertraline (ZOLOFT) 100 MG tablet    Other Visit Diagnoses    Seasonal allergic rhinitis due to other allergic trigger    -  Primary  No sign of secondary bacterial sinusitis  Continue anti histamine oral, flonase restart and add atrovent Return to ENT if indicated   Relevant Medications   ipratropium (ATROVENT) 0.06 % nasal spray   Digital mucous cyst of toe of right foot       Relevant Medications   mupirocin ointment (BACTROBAN) 2 %   Other Relevant Orders   Ambulatory referral to Podiatry    Clinically localized palpable cystic small structure on dorsal toe distal joint, suspect source of her symptoms No sign of cellulitis or abscess - but agree empiric topical antibiotic mupirocin for 1-2 weeks coverage for now Referral to Podiatry   Orders Placed This Encounter  Procedures  . Ambulatory referral to Podiatry    Referral Priority:   Routine    Referral Type:   Consultation    Referral Reason:   Specialty Services Required    Requested Specialty:   Podiatry    Number of Visits Requested:   1     Meds ordered this encounter  Medications  . gabapentin (NEURONTIN) 100 MG capsule    Sig: TAKE 1 CAPSULE BY MOUTH THREE TIMES A DAY    Dispense:  270 capsule    Refill:  1    M79.7 Keep on file until ready    . sertraline (ZOLOFT) 100 MG tablet    Sig: Take 1 tablet (100 mg total) by mouth daily.    Dispense:  90 tablet    Refill:  1  Keep on file until ready  . hydrochlorothiazide (HYDRODIURIL) 12.5 MG tablet    Sig: Take 1 tablet (12.5 mg total) by mouth daily.    Dispense:  90 tablet    Refill:  1    Keep on file until ready  . ipratropium (ATROVENT) 0.06 % nasal spray    Sig: Place 2 sprays into both nostrils 4 (four) times daily. For up to 5-7 days then stop.    Dispense:  15 mL    Refill:  0  . mupirocin ointment (BACTROBAN) 2 %    Sig: Apply 1 application topically 2 (two) times daily. On affected toe for 7 to 10 days    Dispense:  22 g    Refill:  0      Follow up plan: Return if symptoms worsen or fail to improve.   [name], [facility] 01/06/2020, 2:07 PM

## 2020-01-13 ENCOUNTER — Other Ambulatory Visit: Payer: Self-pay | Admitting: Nurse Practitioner

## 2020-01-13 DIAGNOSIS — K219 Gastro-esophageal reflux disease without esophagitis: Secondary | ICD-10-CM

## 2020-01-25 ENCOUNTER — Other Ambulatory Visit: Payer: Self-pay | Admitting: Podiatry

## 2020-01-25 ENCOUNTER — Ambulatory Visit (INDEPENDENT_AMBULATORY_CARE_PROVIDER_SITE_OTHER): Payer: No Typology Code available for payment source

## 2020-01-25 ENCOUNTER — Ambulatory Visit: Payer: No Typology Code available for payment source | Admitting: Podiatry

## 2020-01-25 ENCOUNTER — Other Ambulatory Visit: Payer: Self-pay

## 2020-01-25 DIAGNOSIS — M79672 Pain in left foot: Secondary | ICD-10-CM

## 2020-01-25 DIAGNOSIS — M722 Plantar fascial fibromatosis: Secondary | ICD-10-CM

## 2020-01-25 DIAGNOSIS — M79671 Pain in right foot: Secondary | ICD-10-CM

## 2020-01-26 ENCOUNTER — Encounter: Payer: Self-pay | Admitting: Podiatry

## 2020-01-26 NOTE — Progress Notes (Signed)
Subjective:  Patient ID: Denise Elliott, female    DOB: 1961/05/17,  MRN: [id number]  Chief Complaint  Patient presents with  . Plantar Fasciitis    pt is here for possible plantar fasciitis from the bottom of both heels, pain has been going on for about 5 years, pain is elevated when she is walking. Pt is also here for a possible cyst on the left 3rd toe, as well as possibly the left second toe, which has been going on for about 4 months, pain is elevated when wearing socks, pt has tried a heating pad, but not much has helped. Pt is also looking to talk about possible toenail fungus on the left big toe.    59 y.o. female presents with the above complaint.  Patient presents with complaints of bilateral heel pain that has been going for about 5 years.  Patient states it hurts on the bottom of the heel.  Pain is elevated when standing especially getting up from a bed or stationary position.  Patient has tried over-the-counter custom orthotics.  She denies any other acute complaints.  She has not done any treatment options for this.  She also has secondary complaint of possible cyst formation to the right third digit that has been going on for about 4 months.  Patient has tried a heating pad which has helped progress the cyst formation.  Patient has tried corn protectors which has also helped.  Pain is elevated when wearing sneakers and ambulating.  Patient also has a tertiary complaint of possible toenail fungus.  She would like to get more information on it.   Review of Systems: Negative except as noted in the HPI. Denies N/V/F/Ch.  Past Medical History:  Diagnosis Date  . Allergy   . Anxiety   . Arthritis   . Cervical dystonia   . Depression   . Fibromyalgia   . Fibromyalgia   . GERD (gastroesophageal reflux disease)   . Glaucoma    closed angle glaucoma both eyes, laser treatment in past  . Goiter   . HLD (hyperlipidemia)    statin intolerance  . Hypertension   . 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 Sleep apnea   . Sleep apnea   . Sleep apnea     Current Outpatient Medications:  .  calcium carbonate (OS-CAL - DOSED IN MG OF ELEMENTAL CALCIUM) 1250 (500 Ca) MG tablet, Take 1 tablet by mouth., Disp: , Rfl:  .  conjugated estrogens (PREMARIN) vaginal cream, Place 1 Applicatorful vaginally 3 (three) times a week. Only apply pea-sized amount to external region., Disp: 42.5 g, Rfl: 3 .  cyclobenzaprine (FLEXERIL) 5 MG tablet, TAKE 1/2 TABLETS (2.5 MG TOTAL) BY MOUTH 3 (THREE) TIMES DAILY AS NEEDED FOR MUSCLE SPASMS. CAUTION DROWSINESS, Disp: 30 tablet, Rfl: 1 .  fluticasone (FLONASE) 50 MCG/ACT nasal spray, SPRAY 2 SPRAYS INTO EACH NOSTRIL EVERY DAY, Disp: 48 mL, Rfl: 1 .  gabapentin (NEURONTIN) 100 MG capsule, TAKE 1 CAPSULE BY MOUTH THREE TIMES A DAY, Disp: 270 capsule, Rfl: 1 .  hydrochlorothiazide (HYDRODIURIL) 12.5 MG tablet, Take 1 tablet (12.5 mg total) by mouth daily., Disp: 90 tablet, Rfl: 1 .  ipratropium (ATROVENT) 0.06 % nasal spray, Place 2 sprays into both nostrils 4 (four) times daily. For up to 5-7 days then stop., Disp: 15 mL, Rfl: 0 .  Magnesium 250 MG TABS, Take 1 mg by mouth 2 (two) times daily., Disp: , Rfl:  .  mupirocin ointment (BACTROBAN) 2 %, Apply 1 application topically 2 (two) times daily. On affected toe for 7 to 10 days, Disp: 22 g, Rfl: 0 .  Omega-3 Fatty Acids (FISH OIL) 1000 MG CAPS, Take 1 capsule by mouth., Disp: , Rfl:  .  omeprazole (PRILOSEC) 20 MG capsule, TAKE 1 CAPSULE BY MOUTH EVERY DAY, Disp: 90 capsule, Rfl: 1 .  ondansetron (ZOFRAN) 4 MG tablet, TAKE 1 TABLET BY MOUTH EVERY 8 HOURS AS NEEDED FOR NAUSEA AND VOMITING, Disp: 60 tablet, Rfl: 0 .  potassium chloride SA (K-DUR,KLOR-CON) 20 MEQ tablet, Take by mouth., Disp: , Rfl:  .  scopolamine (TRANSDERM-SCOP) 1 MG/3DAYS, USE AS DIRECTED EVERY 72 HOURS, Disp: , Rfl:  .  sertraline (ZOLOFT) 100 MG tablet, Take 1 tablet (100 mg total) by mouth  daily., Disp: 90 tablet, Rfl: 1 .  traZODone (DESYREL) 50 MG tablet, Take 1 tablet (50 mg total) by mouth at bedtime as needed for sleep., Disp: 90 tablet, Rfl: 1 .  triamcinolone ointment (KENALOG) 0.1 %, Apply topically., Disp: , Rfl:  .  sucralfate (CARAFATE) 1 g tablet, TAKE 1 TABLET (1 G TOTAL) BY MOUTH 4 (FOUR) TIMES DAILY - WITH MEALS AND AT BEDTIME FOR 14 DAYS. REPEAT X 1 ADDITIONAL COURSE IF NEEDED, Disp: 120 tablet, Rfl: 0  Social History   Tobacco Use  Smoking Status Never Smoker  Smokeless Tobacco Never Used    Allergies  Allergen Reactions  . Versed [Midazolam] Hives and Other (See Comments)    BP increased unable to talk BP increased unable to hear   Objective:  There were no vitals filed for this visit. There is no height or weight on file to calculate BMI. Constitutional Well developed. Well nourished.  Vascular Dorsalis pedis pulses palpable bilaterally. Posterior tibial pulses palpable bilaterally. Capillary refill normal to all digits.  No cyanosis or clubbing noted. Pedal hair growth normal.  Neurologic Normal speech. Oriented to person, place, and time. Epicritic sensation to light touch grossly present bilaterally.  Dermatologic Nails well groomed and normal in appearance. No open wounds. No skin lesions.  Orthopedic: Normal joint ROM without pain or crepitus bilaterally. No visible deformities. Tender to palpation at the calcaneal tuber bilaterally. No pain with calcaneal squeeze bilaterally. Ankle ROM full range with pain bilaterally. Silfverskiold Test: negative bilaterally.   Radiographs: Taken and reviewed. No acute fractures or dislocations. No evidence of stress fracture.  Plantar heel spur absent. Posterior heel spur absent.   Assessment:   1. Foot pain, bilateral    Plan:  Patient was evaluated and treated and all questions answered.  Plantar Fasciitis, bilaterally - XR reviewed as above.  - Educated on icing and stretching.  Instructions given.  - Injection delivered to the plantar fascia as below. - DME: Plantar Fascial Brace - Pharmacologic management: None.  Right third digit history of cyst formation -I explained to the patient the etiology of cyst formation and various treatment options associated with it.  Given that currently the cyst itself has resolved I will hold off on any kind of procedure to drain the cyst or inject steroid medication for now.  I believe patient will benefit from toe protector. -Toe protector was dispensed -Also spoke extensively with the patient for for shoe gear modification that allow wider toe box.  Patient states she will obtain new balance or Brooks with wider toe box.  Procedure: Injection Tendon/Ligament Location: Bilateral plantar fascia at the glabrous junction; medial approach. Skin Prep: alcohol Injectate: 0.5 cc 0.5% marcaine  plain, 0.5 cc of 1% Lidocaine, 0.5 cc kenalog 10. Disposition: Patient tolerated procedure well. Injection site dressed with a band-aid.  Return in about 4 weeks (around 02/22/2020).

## 2020-01-28 ENCOUNTER — Other Ambulatory Visit: Payer: Self-pay | Admitting: Family Medicine

## 2020-01-28 DIAGNOSIS — J3089 Other allergic rhinitis: Secondary | ICD-10-CM

## 2020-02-22 ENCOUNTER — Other Ambulatory Visit: Payer: Self-pay

## 2020-02-22 ENCOUNTER — Ambulatory Visit: Payer: No Typology Code available for payment source | Admitting: Obstetrics and Gynecology

## 2020-02-22 ENCOUNTER — Encounter: Payer: Self-pay | Admitting: Obstetrics and Gynecology

## 2020-02-22 ENCOUNTER — Ambulatory Visit: Payer: No Typology Code available for payment source | Admitting: Podiatry

## 2020-02-22 VITALS — BP 169/105 | HR 68 | Ht 65.5 in | Wt 119.0 lb

## 2020-02-22 DIAGNOSIS — M722 Plantar fascial fibromatosis: Secondary | ICD-10-CM

## 2020-02-22 DIAGNOSIS — B9689 Other specified bacterial agents as the cause of diseases classified elsewhere: Secondary | ICD-10-CM | POA: Diagnosis not present

## 2020-02-22 DIAGNOSIS — M2142 Flat foot [pes planus] (acquired), left foot: Secondary | ICD-10-CM

## 2020-02-22 DIAGNOSIS — M2141 Flat foot [pes planus] (acquired), right foot: Secondary | ICD-10-CM

## 2020-02-22 DIAGNOSIS — N907 Vulvar cyst: Secondary | ICD-10-CM

## 2020-02-22 DIAGNOSIS — N952 Postmenopausal atrophic vaginitis: Secondary | ICD-10-CM

## 2020-02-22 DIAGNOSIS — N950 Postmenopausal bleeding: Secondary | ICD-10-CM

## 2020-02-22 DIAGNOSIS — I10 Essential (primary) hypertension: Secondary | ICD-10-CM

## 2020-02-22 DIAGNOSIS — M79672 Pain in left foot: Secondary | ICD-10-CM | POA: Diagnosis not present

## 2020-02-22 DIAGNOSIS — D251 Intramural leiomyoma of uterus: Secondary | ICD-10-CM | POA: Diagnosis not present

## 2020-02-22 DIAGNOSIS — N760 Acute vaginitis: Secondary | ICD-10-CM | POA: Diagnosis not present

## 2020-02-22 DIAGNOSIS — M79671 Pain in right foot: Secondary | ICD-10-CM | POA: Diagnosis not present

## 2020-02-22 MED ORDER — SOLOSEC 2 G PO PACK: 1.0000 | PACK | Freq: Once | ORAL | 0 refills | Status: AC

## 2020-02-22 NOTE — Progress Notes (Signed)
Pt present due to vaginal itching with fishy odor and swollen vaginal area. Feb 03, 2020 noticed vaginal bleeding and cramping. Pt stated her vaginal area has been swollen for 3 days now and is tender to the touch.

## 2020-02-22 NOTE — Patient Instructions (Addendum)
Epidermal Cyst  An epidermal cyst is a sac made of skin tissue. The sac contains a substance called keratin. Keratin is a protein that is normally secreted through the hair follicles. When keratin becomes trapped in the top layer of skin (epidermis), it can form an epidermal cyst. Epidermal cysts can be found anywhere on your body. These cysts are usually harmless (benign), and they may not cause symptoms unless they become infected. What are the causes? This condition may be caused by:  A blocked hair follicle.  A hair that curls and re-enters the skin instead of growing straight out of the skin (ingrown hair).  A blocked pore.  Irritated skin.  An injury to the skin.  Certain conditions that are passed along from parent to child (inherited).  Human papillomavirus (HPV).  Long-term (chronic) sun damage to the skin. What increases the risk? The following factors may make you more likely to develop an epidermal cyst:  Having acne.  Being overweight.  Being 30-40 years old. What are the signs or symptoms? The only symptom of this condition may be a small, painless lump underneath the skin. When an epidermal cyst ruptures, it may become infected. Symptoms may include:  Redness.  Inflammation.  Tenderness.  Warmth.  Fever.  Keratin draining from the cyst. Keratin is grayish-white, bad-smelling substance.  Pus draining from the cyst. How is this diagnosed? This condition is diagnosed with a physical exam.  In some cases, you may have a sample of tissue (biopsy) taken from your cyst to be examined under a microscope or tested for bacteria.  You may be referred to a health care provider who specializes in skin care (dermatologist). How is this treated? In many cases, epidermal cysts go away on their own without treatment. If a cyst becomes infected, treatment may include:  Opening and draining the cyst, done by a health care provider. After draining, minor surgery to  remove the rest of the cyst may be done.  Antibiotic medicine.  Injections of medicines (steroids) that help to reduce inflammation.  Surgery to remove the cyst. Surgery may be done if the cyst: ? Becomes large. ? Bothers you. ? Has a chance of turning into cancer.  Do not try to open a cyst yourself. Follow these instructions at home:  Take over-the-counter and prescription medicines only as told by your health care provider.  If you were prescribed an antibiotic medicine, take it it as told by your health care provider. Do not stop using the antibiotic even if you start to feel better.  Keep the area around your cyst clean and dry.  Wear loose, dry clothing.  Avoid touching your cyst.  Check your cyst every day for signs of infection. Check for: ? Redness, swelling, or pain. ? Fluid or blood. ? Warmth. ? Pus or a bad smell.  Keep all follow-up visits as told by your health care provider. This is important. How is this prevented?  Wear clean, dry, clothing.  Avoid wearing tight clothing.  Keep your skin clean and dry. Take showers or baths every day. Contact a health care provider if:  Your cyst develops symptoms of infection.  Your condition is not improving or is getting worse.  You develop a cyst that looks different from other cysts you have had.  You have a fever. Get help right away if:  Redness spreads from the cyst into the surrounding area. Summary  An epidermal cyst is a sac made of skin tissue. These cysts are   usually harmless (benign), and they may not cause symptoms unless they become infected.  If a cyst becomes infected, treatment may include surgery to open and drain the cyst, or to remove it. Treatment may also include medicines by mouth or through an injection.  Take over-the-counter and prescription medicines only as told by your health care provider. If you were prescribed an antibiotic medicine, take it as told by your health care  provider. Do not stop using the antibiotic even if you start to feel better.  Contact a health care provider if your condition is not improving or is getting worse.  Keep all follow-up visits as told by your health care provider. This is important. This information is not intended to replace advice given to you by your health care provider. Make sure you discuss any questions you have with your health care provider. Document Revised: 03/25/2019 Document Reviewed: 06/15/2018 Elsevier Patient Education  [place].    Paroxetine capsules What is this medicine? PAROXETINE (pa ROX e teen) is used to treat hot flashes due to menopause. This medicine may be used for other purposes; ask your health care provider or pharmacist if you have questions. COMMON BRAND NAME(S): Brisdelle What should I tell my health care provider before I take this medicine? They need to know if you have any of these conditions:  bipolar disorder or a family history of bipolar disorder  bleeding disorders  glaucoma  heart disease  kidney disease  liver disease  low levels of sodium in the blood  seizures  suicidal thoughts, plans, or attempt; a previous suicide attempt by you or a family member  take MAOIs like Carbex, Eldepryl, Marplan, Nardil, and Parnate  take medicines that treat or prevent blood clots  thyroid disease  an unusual or allergic reaction to paroxetine, other medicines, foods, dyes, or preservatives  pregnant or trying to get pregnant  breast-feeding How should I use this medicine? Take this medicine by mouth once daily at bedtime. Follow the directions on the prescription label. This medicine can be taken with or without food. Take your medicine at regular intervals. Do not take your medicine more often than directed. A special MedGuide will be given to you by the pharmacist with each prescription and refill. Be sure to read this information carefully each  time. Overdosage: If you think you have taken too much of this medicine contact a poison control center or emergency room at once. NOTE: This medicine is only for you. Do not share this medicine with others. What if I miss a dose? If you miss a dose, take it as soon as you can. If it is almost time for your next dose, take only that dose. Do not take double or extra doses. What may interact with this medicine? Do not take this medicine with any of the following medications:  linezolid  MAOIs like Carbex, Eldepryl, Marplan, Nardil, and Parnate  methylene blue (injected into a vein)  pimozide  thioridazine This medicine may also interact with the following medications:  alcohol  amphetamines  aspirin and aspirin-like medicines  atomoxetine  certain medicines for depression, anxiety, or psychotic disturbances  certain medicines for irregular heart beat like propafenone, flecainide, encainide, and quinidine  certain medicines for migraine headache like almotriptan, eletriptan, frovatriptan, naratriptan, rizatriptan, sumatriptan, zolmitriptan  cimetidine  digoxin  diuretics  fentanyl  fosamprenavir  furazolidone  isoniazid  lithium  medicines that treat or prevent blood clots like warfarin, enoxaparin, and dalteparin  medicines for sleep  NSAIDs, medicines for pain and inflammation, like ibuprofen or naproxen  phenobarbital  phenytoin  procarbazine  rasagiline  ritonavir  supplements like St. John's wort, kava kava, valerian  tamoxifen  tramadol  tryptophan This list may not describe all possible interactions. Give your health care provider a list of all the medicines, herbs, non-prescription drugs, or dietary supplements you use. Also tell them if you smoke, drink alcohol, or use illegal drugs. Some items may interact with your medicine. What should I watch for while using this medicine? Tell your doctor or healthcare professional if your symptoms  do not start to get better or if they get worse. Visit your doctor or health care professional for regular checks on your progress. Patients and their families should watch out for new or worsening thoughts of suicide or depression. Also watch out for sudden changes in feelings such as feeling anxious, agitated, panicky, irritable, hostile, aggressive, impulsive, severely restless, overly excited and hyperactive, or not being able to sleep. If this happens, especially at the beginning of treatment or after a change in dose, call your health care professional. Dennis Bast may get drowsy or dizzy. Do not drive, use machinery, or do anything that needs mental alertness until you know how this medicine affects you. Do not stand or sit up quickly, especially if you are an older patient. This reduces the risk of dizzy or fainting spells. Alcohol may interfere with the effect of this medicine. Avoid alcoholic drinks. Your mouth may get dry. Chewing sugarless gum, sucking hard candy and drinking plenty of water will help. Contact your doctor if the problem does not go away or is severe. Women should inform their doctor if they wish to become pregnant or think they might be pregnant. There is a potential for serious side effects to an unborn child. Talk to your health care professional or pharmacist for more information. Do not become pregnant while taking this medicine. What side effects may I notice from receiving this medicine? Side effects that you should report to your doctor or health care professional as soon as possible:  allergic reactions like skin rash, itching or hives, swelling of the face, lips, or tongue  anxious  black, tarry stools  changes in vision  confusion  elevated mood, decreased need for sleep, racing thoughts, impulsive behavior  eye pain  fast, irregular heartbeat  feeling faint or lightheaded, falls  feeling agitated, angry, or irritable  hallucination, loss of contact with  reality  loss of balance or coordination  loss of memory  restlessness, pacing, inability to keep still  seizures  stiff muscles  suicidal thoughts or other mood changes  trouble sleeping  unusual bleeding or bruising  unusually weak or tired  vomiting Side effects that usually do not require medical attention (report to your doctor or health care professional if they continue or are bothersome):  change in appetite or weight  change in sex drive or performance  diarrhea  dizziness  dry mouth  headache  increased sweating  indigestion, nausea  tired  tremors This list may not describe all possible side effects. Call your doctor for medical advice about side effects. You may report side effects to FDA at 1-800-FDA-1088. Where should I keep my medicine? Keep out of the reach of children. Store at room temperature between 20 and 25 degrees C (68 and 77 degrees F). Throw away any unused medicine after the expiration date. NOTE: This sheet is a summary. It may not cover all possible information. If  you have questions about this medicine, talk to your doctor, pharmacist, or health care provider.  2020 Elsevier/Gold Standard (2016-05-06 13:22:48)   Bacterial Vaginosis  Bacterial vaginosis is a vaginal infection that occurs when the normal balance of bacteria in the vagina is disrupted. It results from an overgrowth of certain bacteria. This is the most common vaginal infection among women ages 63-44. Because bacterial vaginosis increases your risk for STIs (sexually transmitted infections), getting treated can help reduce your risk for chlamydia, gonorrhea, herpes, and HIV (human immunodeficiency virus). Treatment is also important for preventing complications in pregnant women, because this condition can cause an early (premature) delivery. What are the causes? This condition is caused by an increase in harmful bacteria that are normally present in small amounts in  the vagina. However, the reason that the condition develops is not fully understood. What increases the risk? The following factors may make you more likely to develop this condition:  Having a new sexual partner or multiple sexual partners.  Having unprotected sex.  Douching.  Having an intrauterine device (IUD).  Smoking.  Drug and alcohol abuse.  Taking certain antibiotic medicines.  Being pregnant. You cannot get bacterial vaginosis from toilet seats, bedding, swimming pools, or contact with objects around you. What are the signs or symptoms? Symptoms of this condition include:  Grey or white vaginal discharge. The discharge can also be watery or foamy.  A fish-like odor with discharge, especially after sexual intercourse or during menstruation.  Itching in and around the vagina.  Burning or pain with urination. Some women with bacterial vaginosis have no signs or symptoms. How is this diagnosed? This condition is diagnosed based on:  Your medical history.  A physical exam of the vagina.  Testing a sample of vaginal fluid under a microscope to look for a large amount of bad bacteria or abnormal cells. Your health care provider may use a cotton swab or a small wooden spatula to collect the sample. How is this treated? This condition is treated with antibiotics. These may be given as a pill, a vaginal cream, or a medicine that is put into the vagina (suppository). If the condition comes back after treatment, a second round of antibiotics may be needed. Follow these instructions at home: Medicines  Take over-the-counter and prescription medicines only as told by your health care provider.  Take or use your antibiotic as told by your health care provider. Do not stop taking or using the antibiotic even if you start to feel better. General instructions  If you have a female sexual partner, tell her that you have a vaginal infection. She should see her health care  provider and be treated if she has symptoms. If you have a female sexual partner, he does not need treatment.  During treatment: ? Avoid sexual activity until you finish treatment. ? Do not douche. ? Avoid alcohol as directed by your health care provider. ? Avoid breastfeeding as directed by your health care provider.  Drink enough water and fluids to keep your urine clear or pale yellow.  Keep the area around your vagina and rectum clean. ? Wash the area daily with warm water. ? Wipe yourself from front to back after using the toilet.  Keep all follow-up visits as told by your health care provider. This is important. How is this prevented?  Do not douche.  Wash the outside of your vagina with warm water only.  Use protection when having sex. This includes latex condoms and dental  dams.  Limit how many sexual partners you have. To help prevent bacterial vaginosis, it is best to have sex with just one partner (monogamous).  Make sure you and your sexual partner are tested for STIs.  Wear cotton or cotton-lined underwear.  Avoid wearing tight pants and pantyhose, especially during summer.  Limit the amount of alcohol that you drink.  Do not use any products that contain nicotine or tobacco, such as cigarettes and e-cigarettes. If you need help quitting, ask your health care provider.  Do not use illegal drugs. Where to find more information  Centers for Disease Control and Prevention: AppraiserFraud.fi  American Sexual Health Association (ASHA): www.ashastd.org  [place] Department of Health and Financial controller, Office on Women's Health: DustingSprays.pl or SecuritiesCard.it Contact a health care provider if:  Your symptoms do not improve, even after treatment.  You have more discharge or pain when urinating.  You have a fever.  You have pain in your abdomen.  You have pain during sex.  You have vaginal bleeding between  periods. Summary  Bacterial vaginosis is a vaginal infection that occurs when the normal balance of bacteria in the vagina is disrupted.  Because bacterial vaginosis increases your risk for STIs (sexually transmitted infections), getting treated can help reduce your risk for chlamydia, gonorrhea, herpes, and HIV (human immunodeficiency virus). Treatment is also important for preventing complications in pregnant women, because the condition can cause an early (premature) delivery.  This condition is treated with antibiotic medicines. These may be given as a pill, a vaginal cream, or a medicine that is put into the vagina (suppository). This information is not intended to replace advice given to you by your health care provider. Make sure you discuss any questions you have with your health care provider. Document Revised: 11/14/2017 Document Reviewed: 08/17/2016 Elsevier Patient Education  2020 [employer].

## 2020-02-22 NOTE — Progress Notes (Signed)
    GYNECOLOGY PROGRESS NOTE  Subjective:    Patient ID: Denise Elliott, female    DOB: 02-Feb-1961, 59 y.o.   MRN: [id number]  HPI  Patient is a 59 y.o. [phone] female who presents for complaints of vaginal itching, fishy odor, and swelling of the vaginal region (specifically near the clitoral area).  This has been ongoing for the past 3 days.  She also complains of an episode of vaginal bleeding and cramping for several days.  Does have a history of uterine fibroids, and prior history of use of HRT which she discontinued in November 2020.   The following portions of the patient's history were reviewed and updated as appropriate: allergies, current medications, past family history, past medical history, past social history, past surgical history and problem list.  Review of Systems Pertinent items noted in HPI and remainder of comprehensive ROS otherwise negative.   Objective:   Blood pressure (!) 169/105, pulse 68, height 5' 5.5" (1.664 m), weight 119 lb (54 kg), last menstrual period 02/03/2020. General appearance: alert and no distress Abdomen: soft, non-tender; bowel sounds normal; no masses,  no organomegaly Pelvic: external genitalia with 1 x 1 cm nodular mass (appearance of inclusion cyst) just above and slightly right lateral to clitoral hood, non-tender.  Rectovaginal septum normal.  Vagina with scant thin white-gray discharge, m mildly atrophic.  Cervix normal appearing, no lesions and no motion tenderness.  Uterus mobile, nontender, normal shape and size.  Adnexae non-palpable, nontender bilaterally.    Labs:  Microscopic wet-mount exam shows moderate clue cells, no hyphae, no trichomonads, scant white blood cells. KOH done.    Assessment:   Bacterial vaginosis PMB (postmenopausal bleeding) Vaginal atrophy Intramural leiomyoma of uterus Vulvar cyst HTN  Plan:    1. Bacterial vaginosis - wet prep positive for clue cells.  Discussed diagnosis, will treat with  prescription of Solosec.  2. PMB - unclear cause. Could be secondary to vaginitis (infectious due to bacterial vaginosis, or non-infectious due to vaginal atrophy) or uterine fibroids. Patient also with a prior history of fibroids (small, 1 cm noted on ultrasound 1 year ago). Also with prior h/o HRT use, recently discontinued.  Isolated event, will see if bleeding occurs again after treatment of her BV infection. If bleeding recurs, will need further workup with endometrial biopsy and repeat ultrasound. If negative findings, will manage her vaginal atrophy.  3. Vulvar inclusion cyst - patient reports that the cyst comes and goes, usually during times of stress. Wonders if it should be removed. Advised on home care measures, as it usually goes away after several days.  4. HTN - patient with elevated BPs today, notes taking her BP meds.   Further workup and treatment could be done if symptoms persist, worsen or new related symptoms occur. The patient will call in that eventuality.   Rubie Maid, MD [facility]

## 2020-02-23 ENCOUNTER — Encounter: Payer: Self-pay | Admitting: Podiatry

## 2020-02-23 ENCOUNTER — Other Ambulatory Visit: Payer: Self-pay | Admitting: Nurse Practitioner

## 2020-02-23 DIAGNOSIS — K219 Gastro-esophageal reflux disease without esophagitis: Secondary | ICD-10-CM

## 2020-02-23 NOTE — Progress Notes (Signed)
Subjective:  Patient ID: Denise Elliott, female    DOB: November 12, 1961,  MRN: [id number]  Chief Complaint  Patient presents with  . Plantar Fasciitis    pt is here for a f/u of plantar fasciitis of both feet, pt states that she is overall feeling alot better on the left plantar forefoot, but states that she is still having pain on the dorsal foot, as well as the bottom of the left heel, pt is also concerned with both of her big toes having a numbness sensation to them as well.    59 y.o. female presents with the above complaint.  Patient is following up for bilateral right plantar fasciitis.  Patient states that the left is doing much better has completely resolved with pain.  Patient states the right side there is still a little bit of tenderness to it.  Patient states the plantar fascial brace has been helping tremendously.  She has been wearing it at all times.  She also states that she got new pair of new balance sneakers which has not helped.  States injection that was given last time has helped considerably.  She denies any other acute complaints.  She would like to know if there is any further treatment options that should be discussed.  Review of Systems: Negative except as noted in the HPI. Denies N/V/F/Ch.  Past Medical History:  Diagnosis Date  . Allergy   . Anxiety   . Arthritis   . Cervical dystonia   . Depression   . Fibromyalgia   . Fibromyalgia   . GERD (gastroesophageal reflux disease)   . Glaucoma    closed angle glaucoma both eyes, laser treatment in past  . Goiter   . HLD (hyperlipidemia)    statin intolerance  . Hypertension   . 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 Plantar fasciitis   . Sleep apnea   . Sleep apnea   . Sleep apnea     Current Outpatient Medications:  .  calcium carbonate (OS-CAL - DOSED IN MG OF ELEMENTAL CALCIUM) 1250 (500 Ca) MG tablet, Take 1 tablet by mouth., Disp: , Rfl:  .  conjugated estrogens  (PREMARIN) vaginal cream, Place 1 Applicatorful vaginally 3 (three) times a week. Only apply pea-sized amount to external region., Disp: 42.5 g, Rfl: 3 .  cyclobenzaprine (FLEXERIL) 5 MG tablet, TAKE 1/2 TABLETS (2.5 MG TOTAL) BY MOUTH 3 (THREE) TIMES DAILY AS NEEDED FOR MUSCLE SPASMS. CAUTION DROWSINESS (Patient not taking: Reported on 02/22/2020), Disp: 30 tablet, Rfl: 1 .  fluticasone (FLONASE) 50 MCG/ACT nasal spray, SPRAY 2 SPRAYS INTO EACH NOSTRIL EVERY DAY (Patient not taking: Reported on 02/22/2020), Disp: 48 mL, Rfl: 1 .  gabapentin (NEURONTIN) 100 MG capsule, TAKE 1 CAPSULE BY MOUTH THREE TIMES A DAY, Disp: 270 capsule, Rfl: 1 .  hydrochlorothiazide (HYDRODIURIL) 12.5 MG tablet, Take 1 tablet (12.5 mg total) by mouth daily., Disp: 90 tablet, Rfl: 1 .  ipratropium (ATROVENT) 0.06 % nasal spray, PLACE 2 SPRAYS INTO BOTH NOSTRILS 4 (FOUR) TIMES DAILY. FOR UP TO 5-7 DAYS THEN STOP. (Patient not taking: Reported on 02/22/2020), Disp: 15 mL, Rfl: 2 .  Magnesium 250 MG TABS, Take 1 mg by mouth 2 (two) times daily., Disp: , Rfl:  .  mupirocin ointment (BACTROBAN) 2 %, Apply 1 application topically 2 (two) times daily. On affected toe for 7 to 10 days (Patient not taking: Reported on 02/22/2020), Disp: 22 g, Rfl: 0 .  Omega-3  Fatty Acids (FISH OIL) 1000 MG CAPS, Take 1 capsule by mouth., Disp: , Rfl:  .  omeprazole (PRILOSEC) 20 MG capsule, TAKE 1 CAPSULE BY MOUTH EVERY DAY, Disp: 90 capsule, Rfl: 1 .  ondansetron (ZOFRAN) 4 MG tablet, TAKE 1 TABLET BY MOUTH EVERY 8 HOURS AS NEEDED FOR NAUSEA AND VOMITING, Disp: 60 tablet, Rfl: 0 .  potassium chloride SA (K-DUR,KLOR-CON) 20 MEQ tablet, Take by mouth., Disp: , Rfl:  .  scopolamine (TRANSDERM-SCOP) 1 MG/3DAYS, As needed, Disp: , Rfl:  .  sertraline (ZOLOFT) 100 MG tablet, Take 1 tablet (100 mg total) by mouth daily., Disp: 90 tablet, Rfl: 1 .  traZODone (DESYREL) 50 MG tablet, Take 1 tablet (50 mg total) by mouth at bedtime as needed for sleep., Disp: 90  tablet, Rfl: 1 .  triamcinolone ointment (KENALOG) 0.1 %, Apply topically. As needed, Disp: , Rfl:  .  Triamcinolone Acetonide (EQ NASAL ALLERGY NA), Place into the nose., Disp: , Rfl:   Social History   Tobacco Use  Smoking Status Never Smoker  Smokeless Tobacco Never Used    Allergies  Allergen Reactions  . Versed [Midazolam] Hives and Other (See Comments)    BP increased unable to talk BP increased unable to hear  . Grapeseed Extract [Nutritional Supplements] Hives    Grapes    Objective:  There were no vitals filed for this visit. There is no height or weight on file to calculate BMI. Constitutional Well developed. Well nourished.  Vascular Dorsalis pedis pulses palpable bilaterally. Posterior tibial pulses palpable bilaterally. Capillary refill normal to all digits.  No cyanosis or clubbing noted. Pedal hair growth normal.  Neurologic Normal speech. Oriented to person, place, and time. Epicritic sensation to light touch grossly present bilaterally.  Dermatologic Nails well groomed and normal in appearance. No open wounds. No skin lesions.  Orthopedic: Normal joint ROM without pain or crepitus bilaterally. No visible deformities. Mild tender to palpation at the calcaneal tuber bilaterally. No pain with calcaneal squeeze bilaterally. Ankle ROM full range with pain bilaterally. Silfverskiold Test: negative bilaterally.   Radiographs: None  Assessment:   1. Right foot pain   2. Plantar fasciitis of right foot   3. Plantar fasciitis of left foot   4. Left foot pain   5. Pes planus of both feet    Plan:  Patient was evaluated and treated and all questions answered.  Plantar Fasciitis, bilaterally -I reeducated her about the importance of doing stretching exercises as well as discussing with our management with orthotics.  I explained to her that plantar fasciitis is a symptom of underlying foot structural deformity and without addressing the underlying deformity  plantar fasciitis can recur in the future.  Patient states understanding.  Bilateral flexible pes planus deformity -I explained to the patient the etiology of pes planus deformity and various treatment options associated with it.  I explained to her the relationship with plantar fasciitis with pes planus deformity.  I believe patient will benefit from orthotics management to help control the motion of the hindfoot as well as support the arches of the foot and therefore prevent plantar fasciitis from recurring.  Patient states understanding will obtain orthotics.  Right third digit history of cyst formation -I explained to the patient the etiology of cyst formation and various treatment options associated with it.  Given that currently the cyst itself has resolved I will hold off on any kind of procedure to drain the cyst or inject steroid medication for now.  I  believe patient will benefit from toe protector. -Toe protector was dispensed -Also spoke extensively with the patient for for shoe gear modification that allow wider toe box.  Patient states she will obtain new balance or Brooks with wider toe box.    No follow-ups on file.

## 2020-02-25 ENCOUNTER — Ambulatory Visit: Payer: No Typology Code available for payment source | Admitting: Certified Nurse Midwife

## 2020-02-25 ENCOUNTER — Telehealth: Payer: Self-pay | Admitting: Obstetrics and Gynecology

## 2020-02-25 ENCOUNTER — Encounter: Payer: Self-pay | Admitting: Certified Nurse Midwife

## 2020-02-25 ENCOUNTER — Other Ambulatory Visit: Payer: Self-pay

## 2020-02-25 VITALS — BP 135/83 | HR 70 | Ht 65.5 in | Wt 120.1 lb

## 2020-02-25 DIAGNOSIS — N907 Vulvar cyst: Secondary | ICD-10-CM | POA: Diagnosis not present

## 2020-02-25 DIAGNOSIS — L02225 Furuncle of perineum: Secondary | ICD-10-CM | POA: Diagnosis not present

## 2020-02-25 DIAGNOSIS — L0292 Furuncle, unspecified: Secondary | ICD-10-CM

## 2020-02-25 NOTE — Telephone Encounter (Signed)
Patient walked into clinic and was added to Dani Gobble, CNM schedule to address concerns.

## 2020-02-25 NOTE — Progress Notes (Signed)
Patient c/o vaginal bump x5 days that has increased in size, very uncomfortable "there's a lot of pressure", applying ice with no relief.

## 2020-02-25 NOTE — Telephone Encounter (Signed)
Patient called wanting to be seen for a cyst on her clitoris. Her usual provider had no openings today for same day. Patient said she would see anyone to get this taken care of. I told patient the nurse would give her a call back.

## 2020-02-25 NOTE — Patient Instructions (Signed)

## 2020-02-25 NOTE — Progress Notes (Signed)
Incision and Drainage Note  Denise Elliott is a 59 y.o. year old Caucasian female here for incision and drainage of right vulvar lesion. Informed consent was obtained.   BP 135/83   Pulse 70   Ht 5' 5.5" (1.664 m)   Wt 120 lb 1.6 oz (54.5 kg)   LMP 02/03/2020   BMI 19.68 kg/m   Appropriate time out taken and site identified.  Area prepped in usual sterile fashon. One cc of 2% lidocaine was used to anesthetize the area.   A small stab incision was made in the center of the cyst. Purulent drainage was collected for culture, see orders.   There was less than 3 cc blood loss. There were no complications. Gauze and ice pack were given.    The patient tolerated the procedure well.  She was instructed to keep the area clean and dry and continue home treatment measures.   Reviewed red flag symptoms and when to call.   RTC if symptoms worsen or fail to improve.    Diona Fanti, CNM [facility], [facility] 02/25/20 12:57 PM

## 2020-02-28 ENCOUNTER — Telehealth: Payer: Self-pay | Admitting: Obstetrics and Gynecology

## 2020-02-28 NOTE — Telephone Encounter (Signed)
Pt called in and stated that she is waiting to her back from her test results and that she is requesting a call back. Please advise

## 2020-02-28 NOTE — Telephone Encounter (Signed)
Pt stated that she was still having some drainage from the cyst. Pt was advised to keep the area clean. Pt stated having pelvic pain that maybe fibromyalgia pain. Pt was advised to see a rheumatologist to help with fibromyalgia pain. Pt also was asking about her test results which are not are still being processed in the lab. Pt is aware and informed that as soon as the results are ready we would contact her with the results.

## 2020-03-02 LAB — ANAEROBIC AND AEROBIC CULTURE

## 2020-03-08 NOTE — Telephone Encounter (Signed)
Called pt to inform her of test results given by [facility]. Pt was informed that her test results were negative.

## 2020-03-14 ENCOUNTER — Encounter: Payer: Self-pay | Admitting: Obstetrics and Gynecology

## 2020-03-14 ENCOUNTER — Other Ambulatory Visit: Payer: Self-pay

## 2020-03-14 ENCOUNTER — Ambulatory Visit: Payer: No Typology Code available for payment source | Admitting: Obstetrics and Gynecology

## 2020-03-14 VITALS — BP 164/82 | HR 65 | Ht 65.5 in | Wt 121.0 lb

## 2020-03-14 DIAGNOSIS — R399 Unspecified symptoms and signs involving the genitourinary system: Secondary | ICD-10-CM

## 2020-03-14 LAB — POCT URINALYSIS DIPSTICK
Bilirubin, UA: NEGATIVE
Glucose, UA: NEGATIVE
Ketones, UA: NEGATIVE
Leukocytes, UA: NEGATIVE
Nitrite, UA: NEGATIVE
Protein, UA: NEGATIVE
Spec Grav, UA: 1.01
Urobilinogen, UA: 0.2 U/dL
pH, UA: 7.5

## 2020-03-14 MED ORDER — NITROFURANTOIN MONOHYD MACRO 100 MG PO CAPS: 100.0000 mg | ORAL_CAPSULE | Freq: Two times a day (BID) | ORAL | 1 refills | Status: DC

## 2020-03-14 NOTE — Progress Notes (Signed)
    GYNECOLOGY PROGRESS NOTE  Subjective:    Patient ID: Denise Elliott, female    DOB: 1960-12-23, 59 y.o.   MRN: [id number]  HPI  Patient is a 59 y.o. [phone] female who presents for complaints of blood in urine beginning yesterday. Denies pain with urination, but does notice some mild discomfort. Has been having chills, mild back discomfort, nausea. Blood noticed in the commode. Does not think it is coming from the vagina.   The following portions of the patient's history were reviewed and updated as appropriate: allergies, current medications, past family history, past medical history, past social history, past surgical history and problem list.  Review of Systems Pertinent items noted in HPI and remainder of comprehensive ROS otherwise negative.   Objective:   Blood pressure (!) 164/82, pulse 65, height 5' 5.5" (1.664 m), weight 121 lb (54.9 kg), last menstrual period 02/03/2020. General appearance: alert and no distress Abdomen: normal findings: bowel sounds normal, no masses palpable and soft, non-distended and abnormal findings:  mild tenderness in suprapubic region.  Back: Mild CVA tenderness on right.  Pelvic: deferred   Labs:  Results for orders placed or performed in visit on 03/14/20  POCT urinalysis dipstick  Result Value Ref Range   Color, UA light pink    Clarity, UA clear    Glucose, UA Negative Negative   Bilirubin, UA neg    Ketones, UA neg    Spec Grav, UA 1.010 1.010 - 1.025   Blood, UA non hem trace    pH, UA 7.5 5.0 - 8.0   Protein, UA Negative Negative   Urobilinogen, UA 0.2 0.2 or 1.0 E.U./dL   Nitrite, UA neg    Leukocytes, UA Negative Negative   Appearance light pink;clear    Odor      Assessment:   UTI symptoms  Plan:   - Patient with UTI symptoms. UA only with small amount of non-hemolyzed trace blood. Will order urine culture.  Offered to wait until culture resulted or can start treatment now based on symptoms. Patient desires to go  ahead and start treatment. Prescribed Macrobid. Also encouraged increased hydration and cranberry juice/Azo supplements.    Rubie Maid, MD [facility]  -

## 2020-03-14 NOTE — Progress Notes (Signed)
Pt present for uti symptoms. Pt stated that she noticed blood in her urine yesterday. Pt stated discomfort when she urine.

## 2020-03-14 NOTE — Patient Instructions (Signed)
Urinary Tract Infection, Adult A urinary tract infection (UTI) is an infection of any part of the urinary tract. The urinary tract includes:  The kidneys.  The ureters.  The bladder.  The urethra. These organs make, store, and get rid of pee (urine) in the body. What are the causes? This is caused by germs (bacteria) in your genital area. These germs grow and cause swelling (inflammation) of your urinary tract. What increases the risk? You are more likely to develop this condition if:  You have a small, thin tube (catheter) to drain pee.  You cannot control when you pee or poop (incontinence).  You are female, and: ? You use these methods to prevent pregnancy:  A medicine that kills sperm (spermicide).  A device that blocks sperm (diaphragm). ? You have low levels of a female hormone (estrogen). ? You are pregnant.  You have genes that add to your risk.  You are sexually active.  You take antibiotic medicines.  You have trouble peeing because of: ? A prostate that is bigger than normal, if you are female. ? A blockage in the part of your body that drains pee from the bladder (urethra). ? A kidney stone. ? A nerve condition that affects your bladder (neurogenic bladder). ? Not getting enough to drink. ? Not peeing often enough.  You have other conditions, such as: ? Diabetes. ? A weak disease-fighting system (immune system). ? Sickle cell disease. ? Gout. ? Injury of the spine. What are the signs or symptoms? Symptoms of this condition include:  Needing to pee right away (urgently).  Peeing often.  Peeing small amounts often.  Pain or burning when peeing.  Blood in the pee.  Pee that smells bad or not like normal.  Trouble peeing.  Pee that is cloudy.  Fluid coming from the vagina, if you are female.  Pain in the belly or lower back. Other symptoms include:  Throwing up (vomiting).  No urge to eat.  Feeling mixed up (confused).  Being tired  and grouchy (irritable).  A fever.  Watery poop (diarrhea). How is this treated? This condition may be treated with:  Antibiotic medicine.  Other medicines.  Drinking enough water. Follow these instructions at home:  Medicines  Take over-the-counter and prescription medicines only as told by your doctor.  If you were prescribed an antibiotic medicine, take it as told by your doctor. Do not stop taking it even if you start to feel better. General instructions  Make sure you: ? Pee until your bladder is empty. ? Do not hold pee for a long time. ? Empty your bladder after sex. ? Wipe from front to back after pooping if you are a female. Use each tissue one time when you wipe.  Drink enough fluid to keep your pee pale yellow.  Keep all follow-up visits as told by your doctor. This is important. Contact a doctor if:  You do not get better after 1-2 days.  Your symptoms go away and then come back. Get help right away if:  You have very bad back pain.  You have very bad pain in your lower belly.  You have a fever.  You are sick to your stomach (nauseous).  You are throwing up. Summary  A urinary tract infection (UTI) is an infection of any part of the urinary tract.  This condition is caused by germs in your genital area.  There are many risk factors for a UTI. These include having a small, thin   tube to drain pee and not being able to control when you pee or poop.  Treatment includes antibiotic medicines for germs.  Drink enough fluid to keep your pee pale yellow. This information is not intended to replace advice given to you by your health care provider. Make sure you discuss any questions you have with your health care provider. Document Revised: 11/19/2018 Document Reviewed: 06/11/2018 [employer]vier Patient Education  2020 [employer]  

## 2020-03-20 ENCOUNTER — Ambulatory Visit (INDEPENDENT_AMBULATORY_CARE_PROVIDER_SITE_OTHER): Payer: No Typology Code available for payment source | Admitting: Family Medicine

## 2020-03-20 ENCOUNTER — Encounter: Payer: Self-pay | Admitting: Family Medicine

## 2020-03-20 ENCOUNTER — Other Ambulatory Visit: Payer: Self-pay

## 2020-03-20 VITALS — BP 146/71 | HR 71 | Temp 97.3°F | Ht 65.5 in | Wt 120.6 lb

## 2020-03-20 DIAGNOSIS — F419 Anxiety disorder, unspecified: Secondary | ICD-10-CM

## 2020-03-20 DIAGNOSIS — I10 Essential (primary) hypertension: Secondary | ICD-10-CM | POA: Diagnosis not present

## 2020-03-20 LAB — POCT URINALYSIS DIPSTICK
Bilirubin, UA: NEGATIVE
Glucose, UA: NEGATIVE
Ketones, UA: NEGATIVE
Leukocytes, UA: NEGATIVE
Nitrite, UA: NEGATIVE
Protein, UA: NEGATIVE
Spec Grav, UA: 1.015 (ref 1.010–1.025)
Urobilinogen, UA: 0.2 E.U./dL
pH, UA: 7 (ref 5.0–8.0)

## 2020-03-20 NOTE — Progress Notes (Signed)
Subjective:    Patient ID: Denise Elliott, female    DOB: 1961-01-09, 59 y.o.   MRN: [id number]  Denise Elliott is a 59 y.o. female presenting on 03/20/2020 for Anxiety (Pt requesting counseling to address the anxiety. Inner ear inbalance and tinnitis ) and Hypertension (elevated bp, pt increased her HCTZ to 25MG  x 2 days )   HPI  Denise Elliott presents to clinic for concerns of increased anxiety, inner ear imbalance with tinnitus and her hypertension.  Reports that she has recently increased her hydrochlorothiazide from 12.5mg  to 25mg  daily.  Follows with ENT for her inner ear imbalance and tinnitus and has been to vestibular rehab in the past with some improvement.     Depression screen [facility] 2/9 03/20/2020 07/16/2019 06/17/2019  Decreased Interest 0 1 1  Down, Depressed, Hopeless 1 1 1   PHQ - 2 Score 1 2 2   Altered sleeping 0 3 3  Tired, decreased energy 3 3 3   Change in appetite 2 2 3   Feeling bad or failure about yourself  0 0 0  Trouble concentrating 2 1 3   Moving slowly or fidgety/restless 2 0 0  Suicidal thoughts 0 0 0  PHQ-9 Score 10 11 14   Difficult doing work/chores Somewhat difficult Somewhat difficult Somewhat difficult    Social History   Tobacco Use  . Smoking status: Never Smoker  . Smokeless tobacco: Never Used  Substance Use Topics  . Alcohol use: Not Currently    Comment: socially  . Drug use: No    Review of Systems  Constitutional: Negative.   HENT: Positive for tinnitus. Negative for congestion, dental problem, drooling, ear discharge, ear pain, facial swelling, hearing loss, mouth sores, nosebleeds, postnasal drip, rhinorrhea, sinus pressure, sinus pain, sneezing, sore throat, trouble swallowing and voice change.   Eyes: Negative.   Respiratory: Negative.   Cardiovascular: Negative.   Gastrointestinal: Negative.   Endocrine: Negative.   Genitourinary: Negative.   Musculoskeletal: Negative.   Skin: Negative.   Allergic/Immunologic: Negative.     Neurological: Positive for dizziness. Negative for tremors, seizures, syncope, facial asymmetry, speech difficulty, weakness, light-headedness, numbness and headaches.  Hematological: Negative.   Psychiatric/Behavioral: Positive for dysphoric mood. Negative for agitation, behavioral problems, confusion, decreased concentration, hallucinations, self-injury, sleep disturbance and suicidal ideas. The patient is nervous/anxious. The patient is not hyperactive.    Per HPI unless specifically indicated above     Objective:    BP (!) 146/71 (BP Location: Left Arm, Patient Position: Sitting, Cuff Size: Normal)   Pulse 71   Temp (!) 97.3 F (36.3 C) (Temporal)   Ht 5' 5.5" (1.664 m)   Wt 120 lb 9.6 oz (54.7 kg)   LMP 02/03/2020   BMI 19.76 kg/m   Wt Readings from Last 3 Encounters:  03/20/20 120 lb 9.6 oz (54.7 kg)  03/14/20 121 lb (54.9 kg)  02/25/20 120 lb 1.6 oz (54.5 kg)    Physical Exam Vitals reviewed.  Constitutional:      General: She is not in acute distress.    Appearance: Normal appearance. She is normal weight. She is not ill-appearing or toxic-appearing.  HENT:     Head: Normocephalic.  Eyes:     General: Lids are normal. No scleral icterus.       Right eye: No discharge.        Left eye: No discharge.     Extraocular Movements: Extraocular movements intact.     Conjunctiva/sclera: Conjunctivae normal.     Pupils: Pupils  are equal, round, and reactive to light.  Pulmonary:     Effort: Pulmonary effort is normal.  Skin:    General: Skin is dry.     Capillary Refill: Capillary refill takes less than 2 seconds.  Neurological:     General: No focal deficit present.     Mental Status: She is alert and oriented to person, place, and time.     Cranial Nerves: No facial asymmetry.     Motor: No tremor or seizure activity.     Coordination: Coordination normal.     Gait: Gait normal.  Psychiatric:        Attention and Perception: Attention and perception normal.         Mood and Affect: Affect normal. Mood is anxious and depressed.        Speech: Speech normal.        Behavior: Behavior normal. Behavior is cooperative.        Thought Content: Thought content normal.        Cognition and Memory: Cognition and memory normal.        Judgment: Judgment normal.     Results for orders placed or performed in visit on 03/20/20  POCT Urinalysis Dipstick  Result Value Ref Range   Color, UA yellow    Clarity, UA clear    Glucose, UA Negative Negative   Bilirubin, UA negative    Ketones, UA negative    Spec Grav, UA 1.015 1.010 - 1.025   Blood, UA small    pH, UA 7.0 5.0 - 8.0   Protein, UA Negative Negative   Urobilinogen, UA 0.2 0.2 or 1.0 E.U./dL   Nitrite, UA negative    Leukocytes, UA Negative Negative   Appearance     Odor        Assessment & Plan:   Problem List Items Addressed This Visit      Cardiovascular and Mediastinum   Hypertension - Primary    Uncontrolled hypertension.  BP is not at goal < 130/80.  Pt is working on lifestyle modifications.  Taking medications tolerating well without side effects. No known complications.  Has self increased her hydrochlorothiazide from 12.5mg  to 25mg  daily.  Plan: 1. Continue taking hydrochlorothiazide 25mg  daily. 2. Encouraged heart healthy diet and increasing exercise to 30 minutes most days of the week, going no more than 2 days in a row without exercise. 3. Check BP 1-2 x per day at home, keep log, and bring to clinic at next appointment. 5. Follow up 1-2 weeks and if blood pressure is still elevated will restart Lisinopril 10mg  daily to work towards achieving blood pressure control.        Relevant Orders   POCT Urinalysis Dipstick (Completed)     Other   Anxiety    Chronic anxiety, had previously taken Buspar in 2018 but had stopped to concerns of side effects.  Has been on sertraline 100mg  with continued dizziness and inner ear imbalance.  Reports sertraline 100mg  has helped with her  symptoms, but is now concerned may be having side efffects.  Plan: 1) Continue sertraline 100mg  daily 2) Referral placed for psychiatry evaluation for medication management and counseling 3) Follow up in 3 months, or as needed, if any worsening of symptoms if not yet established with psychiatric provider.      Relevant Orders   Ambulatory referral to Psychiatry      No orders of the defined types were placed in this encounter.  Follow up plan: Return in about 1 week (around 03/27/2020) for BP Recheck, can be virtual.   Harlin Rain, [place] Nurse Practitioner [facility] 03/20/2020, 2:25 PM

## 2020-03-20 NOTE — Assessment & Plan Note (Signed)
Chronic anxiety, had previously taken Buspar in 2018 but had stopped to concerns of side effects.  Has been on sertraline 100mg  with continued dizziness and inner ear imbalance.  Reports sertraline 100mg  has helped with her symptoms, but is now concerned may be having side efffects.  Plan: 1) Continue sertraline 100mg  daily 2) Referral placed for psychiatry evaluation for medication management and counseling 3) Follow up in 3 months, or as needed, if any worsening of symptoms if not yet established with psychiatric provider.

## 2020-03-20 NOTE — Patient Instructions (Signed)
As we discussed, I have put in a referral to psychiatry for medication management and counseling.  If you have not heard back from them in 1 week to give [place] a call and we will follow up with the referral coordinator.  Can continue to take the hydrochlorothiazide 25mg  daily for the next week.  Write down your blood pressure 2x a day and we can meet virtually in 1 week to see where your blood pressure is.  If continues to be elevated, will look into adding back in Lisinopril at a low dose.  We will plan to see you back in 1 week for virtual follow up on your blood pressure.  You will receive a survey after today's visit either digitally by e-mail or paper by [employer]. Your experiences and feedback matter to [place].  Please respond so we know how we are doing as we provide care for you.  Call us with any questions/concerns/needs.  It is my goal to be available to you for your health concerns.  Thanks for choosing me to be a partner in your healthcare needs!  Harlin Rain, FNP-C Family Nurse Practitioner [facility] Phone: [phone]

## 2020-03-20 NOTE — Assessment & Plan Note (Signed)
Uncontrolled hypertension.  BP is not at goal < 130/80.  Pt is working on lifestyle modifications.  Taking medications tolerating well without side effects. No known complications.  Has self increased her hydrochlorothiazide from 12.5mg  to 25mg  daily.  Plan: 1. Continue taking hydrochlorothiazide 25mg  daily. 2. Encouraged heart healthy diet and increasing exercise to 30 minutes most days of the week, going no more than 2 days in a row without exercise. 3. Check BP 1-2 x per day at home, keep log, and bring to clinic at next appointment. 5. Follow up 1-2 weeks and if blood pressure is still elevated will restart Lisinopril 10mg  daily to work towards achieving blood pressure control.

## 2020-03-24 ENCOUNTER — Telehealth: Payer: Self-pay | Admitting: Obstetrics and Gynecology

## 2020-03-24 NOTE — Telephone Encounter (Signed)
Patient called following up with a MyChart message that was sent to her. Informed her that I would leave a message for the nurse.

## 2020-03-27 ENCOUNTER — Telehealth (INDEPENDENT_AMBULATORY_CARE_PROVIDER_SITE_OTHER): Payer: No Typology Code available for payment source | Admitting: Family Medicine

## 2020-03-27 ENCOUNTER — Other Ambulatory Visit: Payer: Self-pay

## 2020-03-27 ENCOUNTER — Encounter: Payer: Self-pay | Admitting: Emergency Medicine

## 2020-03-27 ENCOUNTER — Encounter: Payer: Self-pay | Admitting: Family Medicine

## 2020-03-27 VITALS — BP 131/77 | HR 67

## 2020-03-27 DIAGNOSIS — R319 Hematuria, unspecified: Secondary | ICD-10-CM | POA: Diagnosis not present

## 2020-03-27 DIAGNOSIS — I10 Essential (primary) hypertension: Secondary | ICD-10-CM | POA: Diagnosis not present

## 2020-03-27 DIAGNOSIS — Z79899 Other long term (current) drug therapy: Secondary | ICD-10-CM | POA: Diagnosis not present

## 2020-03-27 DIAGNOSIS — R102 Pelvic and perineal pain: Secondary | ICD-10-CM | POA: Diagnosis not present

## 2020-03-27 LAB — CBC
HCT: 44 % (ref 36.0–46.0)
Hemoglobin: 14.3 g/dL (ref 12.0–15.0)
MCH: 29.4 pg (ref 26.0–34.0)
MCHC: 32.5 g/dL (ref 30.0–36.0)
MCV: 90.5 fL (ref 80.0–100.0)
Platelets: 195 10*3/uL (ref 150–400)
RBC: 4.86 MIL/uL (ref 3.87–5.11)
RDW: 13.3 % (ref 11.5–15.5)
WBC: 8.8 10*3/uL (ref 4.0–10.5)
nRBC: 0 % (ref 0.0–0.2)

## 2020-03-27 LAB — URINALYSIS, COMPLETE (UACMP) WITH MICROSCOPIC
Bacteria, UA: NONE SEEN
Bilirubin Urine: NEGATIVE
Glucose, UA: NEGATIVE mg/dL
Ketones, ur: NEGATIVE mg/dL
Leukocytes,Ua: NEGATIVE
Nitrite: NEGATIVE
Protein, ur: NEGATIVE mg/dL
Specific Gravity, Urine: 1.019 (ref 1.005–1.030)
pH: 5 (ref 5.0–8.0)

## 2020-03-27 LAB — COMPREHENSIVE METABOLIC PANEL
ALT: 35 U/L (ref 0–44)
AST: 31 U/L (ref 15–41)
Albumin: 4.3 g/dL (ref 3.5–5.0)
Alkaline Phosphatase: 71 U/L (ref 38–126)
Anion gap: 10 (ref 5–15)
BUN: 28 mg/dL — ABNORMAL HIGH (ref 6–20)
CO2: 28 mmol/L (ref 22–32)
Calcium: 9.3 mg/dL (ref 8.9–10.3)
Chloride: 102 mmol/L (ref 98–111)
Creatinine, Ser: 0.75 mg/dL (ref 0.44–1.00)
GFR calc Af Amer: >60 mL/min (ref >60)
GFR calc non Af Amer: >60 mL/min (ref >60)
Glucose, Bld: 93 mg/dL (ref 70–99)
Potassium: 4.1 mmol/L (ref 3.5–5.1)
Sodium: 140 mmol/L (ref 135–145)
Total Bilirubin: 0.8 mg/dL (ref 0.3–1.2)
Total Protein: 7.2 g/dL (ref 6.5–8.1)

## 2020-03-27 MED ORDER — HYDROCHLOROTHIAZIDE 25 MG PO TABS: 25.0000 mg | ORAL_TABLET | Freq: Every day | ORAL | 1 refills | Status: DC

## 2020-03-27 MED ORDER — LISINOPRIL 10 MG PO TABS: 10.0000 mg | ORAL_TABLET | Freq: Every day | ORAL | 1 refills | Status: DC

## 2020-03-27 NOTE — Progress Notes (Signed)
Virtual Visit via Telephone  The purpose of this virtual visit is to provide medical care while limiting exposure to the novel coronavirus (COVID19) for both patient and office staff.  Consent was obtained for phone visit:  Yes.   Answered questions that patient had about telehealth interaction:  Yes.   I discussed the limitations, risks, security and privacy concerns of performing an evaluation and management service by telephone. I also discussed with the patient that there may be a patient responsible charge related to this service. The patient expressed understanding and agreed to proceed.  Patient is at home and is accessed via telephone Services are provided by Harlin Rain, FNP-C from [facility])  ---------------------------------------------------------------------- Chief Complaint  Patient presents with  . Hypertension    03/31/20  . Eye Problem    pt state she was bending over yesterday to wipe her puppies feet and  when she stood up she started to see spots.   . Hematuria    gross hematuria while being treated for UTI x 3 days that subsided. The pt spoke [facility] and they informed her that they wanted to wait a few days after treatment and see if it resolve. If not the next step would be referral to Urology.    S: Reviewed CMA documentation. I have called patient and gathered additional HPI as follows:  Denise Elliott presents for telemedicine visit follow up for hypertension.  Reports her lowest blood pressure has been 126/80's.  Has been having concerns of spots in her eyes, which she has an ophthalmologist and will be contacting to schedule an appointment with.  Has concerns for hematuria, which she has contacted her OB/GYN that is treating her for a UTI and reports is to follow up with them in a few days if continuing and they will refer to urology.    Patient is currently home Denies any high risk travel to areas of current concern for  COVID19. Denies any known or suspected exposure to person with or possibly with COVID19.  Denies any fevers, chills, sweats, body ache, cough, shortness of breath, sinus pain or pressure, headache, abdominal pain, diarrhea  Past Medical History:  Diagnosis Date  . Allergy   . Anxiety   . Arthritis   . Cervical dystonia   . Depression   . Fibromyalgia   . Fibromyalgia   . GERD (gastroesophageal reflux disease)   . Glaucoma    closed angle glaucoma both eyes, laser treatment in past  . Goiter   . HLD (hyperlipidemia)    statin intolerance  . Hypertension   . 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 Plantar fasciitis   . Sleep apnea   . Sleep apnea   . Sleep apnea    Social History   Tobacco Use  . Smoking status: Never Smoker  . Smokeless tobacco: Never Used  Substance Use Topics  . Alcohol use: Not Currently    Comment: socially  . Drug use: No    Current Outpatient Medications:  .  calcium carbonate (OS-CAL - DOSED IN MG OF ELEMENTAL CALCIUM) 1250 (500 Ca) MG tablet, Take 1 tablet by mouth., Disp: , Rfl:  .  conjugated estrogens (PREMARIN) vaginal cream, Place 1 Applicatorful vaginally 3 (three) times a week. Only apply pea-sized amount to external region., Disp: 42.5 g, Rfl: 3 .  gabapentin (NEURONTIN) 100 MG capsule, TAKE 1 CAPSULE BY MOUTH THREE TIMES A DAY, Disp: 270  capsule, Rfl: 1 .  hydrochlorothiazide (HYDRODIURIL) 12.5 MG tablet, Take 1 tablet (12.5 mg total) by mouth daily. (Patient taking differently: Take 25 mg by mouth daily. ), Disp: 90 tablet, Rfl: 1 .  Magnesium 250 MG TABS, Take 1 mg by mouth daily. , Disp: , Rfl:  .  Omega-3 Fatty Acids (FISH OIL) 1000 MG CAPS, Take 1 capsule by mouth., Disp: , Rfl:  .  omeprazole (PRILOSEC) 20 MG capsule, TAKE 1 CAPSULE (20 MG TOTAL) BY MOUTH 2 (TWO) TIMES DAILY BEFORE A MEAL. (Patient taking differently: as needed. ), Disp: 180 capsule, Rfl: 1 .  sertraline (ZOLOFT) 100 MG tablet,  Take 1 tablet (100 mg total) by mouth daily., Disp: 90 tablet, Rfl: 1 .  traZODone (DESYREL) 50 MG tablet, Take 1 tablet (50 mg total) by mouth at bedtime as needed for sleep. (Patient taking differently: Take 50 mg by mouth at bedtime. ), Disp: 90 tablet, Rfl: 1 .  lisinopril (ZESTRIL) 10 MG tablet, Take 1 tablet (10 mg total) by mouth daily., Disp: 90 tablet, Rfl: 1  Depression screen [facility] 2/9 03/20/2020 07/16/2019 06/17/2019  Decreased Interest 0 1 1  Down, Depressed, Hopeless 1 1 1   PHQ - 2 Score 1 2 2   Altered sleeping 0 3 3  Tired, decreased energy 3 3 3   Change in appetite 2 2 3   Feeling bad or failure about yourself  0 0 0  Trouble concentrating 2 1 3   Moving slowly or fidgety/restless 2 0 0  Suicidal thoughts 0 0 0  PHQ-9 Score 10 11 14   Difficult doing work/chores Somewhat difficult Somewhat difficult Somewhat difficult    GAD 7 : Generalized Anxiety Score 03/20/2020 07/16/2019 12/29/2017 06/27/2017  Nervous, Anxious, on Edge 3 2 2 1   Control/stop worrying 1 1 2  0  Worry too much - different things 1 1 1  0  Trouble relaxing 1 1 1 1   Restless 3 0 1 0  Easily annoyed or irritable 3 1 1  0  Afraid - awful might happen 2 1 1  0  Total GAD 7 Score 14 7 9 2   Anxiety Difficulty Very difficult Somewhat difficult Somewhat difficult Somewhat difficult    -------------------------------------------------------------------------- O: No physical exam performed due to remote telephone encounter.  Physical Exam: Patient remotely monitored without video.  Verbal communication appropriate.  Cognition normal.   Recent Results (from the past 2160 hour(s))  Anaerobic and Aerobic Culture     Status: None   Collection Time: 02/25/20  4:30 PM   Specimen: Vulva; Other   VU  Result Value Ref Range   Anaerobic Culture Final report    Result 1 Comment     Comment: No anaerobic growth in 72 hours.   Aerobic Culture Final report    Result 1 Mixed skin flora     Comment: Light growth  POCT  urinalysis dipstick     Status: None   Collection Time: 03/14/20 10:08 AM  Result Value Ref Range   Color, UA light pink    Clarity, UA clear    Glucose, UA Negative Negative   Bilirubin, UA neg    Ketones, UA neg    Spec Grav, UA 1.010 1.010 - 1.025   Blood, UA non hem trace    pH, UA 7.5 5.0 - 8.0   Protein, UA Negative Negative   Urobilinogen, UA 0.2 0.2 or 1.0 E.U./dL   Nitrite, UA neg    Leukocytes, UA Negative Negative   Appearance light pink;clear    Odor  POCT Urinalysis Dipstick     Status: Abnormal   Collection Time: 03/20/20  2:37 PM  Result Value Ref Range   Color, UA yellow    Clarity, UA clear    Glucose, UA Negative Negative   Bilirubin, UA negative    Ketones, UA negative    Spec Grav, UA 1.015 1.010 - 1.025   Blood, UA small    pH, UA 7.0 5.0 - 8.0   Protein, UA Negative Negative   Urobilinogen, UA 0.2 0.2 or 1.0 E.U./dL   Nitrite, UA negative    Leukocytes, UA Negative Negative   Appearance     Odor      -------------------------------------------------------------------------- A&P:  Problem List Items Addressed This Visit      Cardiovascular and Mediastinum   Hypertension - Primary   Relevant Medications   lisinopril (ZESTRIL) 10 MG tablet      Meds ordered this encounter  Medications  . lisinopril (ZESTRIL) 10 MG tablet    Sig: Take 1 tablet (10 mg total) by mouth daily.    Dispense:  90 tablet    Refill:  1    Follow-up: - Return in 1 month for hypertension re-evaluation  Patient verbalizes understanding with the above medical recommendations including the limitation of remote medical advice.  Specific follow-up and call-back criteria were given for patient to follow-up or seek medical care more urgently if needed.   - Time spent in direct consultation with patient on phone: 16 minutes  Harlin Rain, [facility] 03/27/2020, 10:31 AM

## 2020-03-27 NOTE — Consult Note (Signed)
Bp reading                         Morning                            Evening   03/31/20-        135/80, 73                          118/68, 67  04/01/20          131/68, 68                          127/73, 73  04/02/20          134/74, 64                          141/81, 70  04/03/20           131/77

## 2020-03-27 NOTE — Patient Instructions (Signed)
As we discussed, continue taking Hydrochlorothiazide 25mg  daily and BEGIN taking Lisinopril 10mg  daily.  Schedule a follow up visit with your ophthalmologist regarding the spots that you saw yesterday when bending over to wipe off the dog's paws.  Follow up with your OB/GYN regarding hematuria concerns, as they requested.  We will plan to see you back in clinic in 1 month for a hypertension follow up visit.  You will receive a survey after today's visit either digitally by e-mail or paper by [employer]. Your experiences and feedback matter to [place].  Please respond so we know how we are doing as we provide care for you.  Call us with any questions/concerns/needs.  It is my goal to be available to you for your health concerns.  Thanks for choosing me to be a partner in your healthcare needs!  Harlin Rain, FNP-C Family Nurse Practitioner [facility] Phone: [phone]

## 2020-03-27 NOTE — Telephone Encounter (Signed)
Pt called no answer LM via VM that I was returning her call to the office.

## 2020-03-27 NOTE — Assessment & Plan Note (Addendum)
Reported blood pressure not being at goal of > 130/80.  Discussed will continue Hydrochlorothiazide 25mg  daily but will add back in Lisinopril 10mg  daily.  To take her blood pressure daily and if over 140/90 to call before our next follow up in 1 month.  Plan: 1. Continue taking hydrochlorothiazide 25mg  daily and BEGIN Lisinopril 10mg  daily 2. Contact ophthalmology regarding spots in eyes seeing yesterday when bent over 3. Encouraged heart healthy diet and increasing exercise to 30 minutes most days of the week, going no more than 2 days in a row without exercise. 4. Check BP 1-2 x per day at home, keep log, and bring to clinic at next appointment. 5. Follow up 1 month.

## 2020-03-27 NOTE — ED Triage Notes (Signed)
Pt presents to ED with pain and burning in her lower abd and aching in her lower back for about a week. Was  treated for UTI the end of march. Noticed continued blood in her urine this past friday and has notified her MD and been waiting for them to call her back.

## 2020-03-28 ENCOUNTER — Emergency Department
Admission: EM | Admit: 2020-03-28 | Discharge: 2020-03-28 | Disposition: A | Discharge status: Home / Self Care | Payer: BLUE CROSS/BLUE SHIELD | Attending: Emergency Medicine | Admitting: Emergency Medicine

## 2020-03-28 ENCOUNTER — Emergency Department: Payer: BLUE CROSS/BLUE SHIELD

## 2020-03-28 DIAGNOSIS — R319 Hematuria, unspecified: Secondary | ICD-10-CM

## 2020-03-28 DIAGNOSIS — R109 Unspecified abdominal pain: Secondary | ICD-10-CM

## 2020-03-28 MED ORDER — DICYCLOMINE HCL 10 MG PO CAPS: 10.0000 mg | ORAL_CAPSULE | Freq: Three times a day (TID) | ORAL | 0 refills | Status: DC | PRN

## 2020-03-28 NOTE — ED Notes (Signed)
Located pt in lobby. Had been sleeping. Informed she would be next to go back.

## 2020-03-28 NOTE — Discharge Instructions (Addendum)
Please seek medical attention for any high fevers, chest pain, shortness of breath, change in behavior, persistent vomiting, bloody stool or any other new or concerning symptoms.  

## 2020-03-28 NOTE — ED Notes (Signed)
Called pt from lobby to be taken to room. Unable to locate pt at this time.

## 2020-03-28 NOTE — ED Notes (Signed)
Called pt from lobby to be taken to exam room. Pt not responding and unable to locate pt.

## 2020-03-28 NOTE — ED Provider Notes (Signed)
[facility] Emergency Department Provider Note ____________________________________________   I have reviewed the triage vital signs and the nursing notes.   HISTORY  Chief Complaint Pelvic Pain and Hematuria   History limited by: Not Limited   HPI Denise Elliott is a 59 y.o. female who presents to the emergency department today as advised by after hours nurse at her ob/gyn clinic. The patient was calling the clinic to follow up on a complaint of hematuria.  The patient has been having issues with hematuria since March.  At that time she was diagnosed with a urinary tract infection was put on antibiotics.  She had called her OB/GYN last week to follow-up on continued hematuria.  Apparently the OB/GYN wanted to see if it would continue over the weekend.  When the patient called Monday to state it was still going on on talk to the nurse she advised patient to get to the emergency department in the next 4 hours.  It is unclear what their specific concern was.  Patient also has been having lower abdominal and pelvic pain since March and the urinary tract infection.   Records reviewed. Per medical record review patient has a history of hematuria, recent UTI diagnosis.   Past Medical History:  Diagnosis Date  . Allergy   . Anxiety   . Arthritis   . Cervical dystonia   . Depression   . Fibromyalgia   . Fibromyalgia   . GERD (gastroesophageal reflux disease)   . Glaucoma    closed angle glaucoma both eyes, laser treatment in past  . Goiter   . HLD (hyperlipidemia)    statin intolerance  . Hypertension   . 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 Plantar fasciitis   . Sleep apnea   . Sleep apnea   . Sleep apnea     Patient Active Problem List   Diagnosis Date Noted  . Moderate recurrent major depression ([place]) 06/17/2019  . GERD (gastroesophageal reflux disease) 07/29/2017  . Arthritis 07/29/2017  . Fibromyalgia  07/29/2017  . HLD (hyperlipidemia) 07/29/2017  . Hypertension 07/29/2017  . Circulation disorder of lower extremity 07/29/2017  . Anxiety 06/29/2017  . Encounter to establish care 06/29/2017  . History of fibromyalgia 03/25/2017  . 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 Occipital neuralgia of left side 03/25/2017  . Cervical dystonia 10/19/2016    Past Surgical History:  Procedure Laterality Date  . CHOLECYSTECTOMY    . COSMETIC SURGERY Left    cheek bone  . GALLBLADDER SURGERY    . TONSILLECTOMY    . TONSILLECTOMY      Prior to Admission medications   Medication Sig Start Date End Date Taking? Authorizing Provider  calcium carbonate (OS-CAL - DOSED IN MG OF ELEMENTAL CALCIUM) 1250 (500 Ca) MG tablet Take 1 tablet by mouth.    [provider]  conjugated estrogens (PREMARIN) vaginal cream Place 1 Applicatorful vaginally 3 (three) times a week. Only apply pea-sized amount to external region. 06/18/19   Karamalegos, Devonne Doughty, DO  gabapentin (NEURONTIN) 100 MG capsule TAKE 1 CAPSULE BY MOUTH THREE TIMES A DAY 01/06/20   Karamalegos, Devonne Doughty, DO  hydrochlorothiazide (HYDRODIURIL) 25 MG tablet Take 1 tablet (25 mg total) by mouth daily. 03/27/20   Malfi, Lupita Raider, FNP  lisinopril (ZESTRIL) 10 MG tablet Take 1 tablet (10 mg total) by mouth daily. 03/27/20   Malfi, Lupita Raider, FNP  Magnesium 250 MG TABS Take 1  mg by mouth daily.     [provider]  Omega-3 Fatty Acids (FISH OIL) 1000 MG CAPS Take 1 capsule by mouth.    [provider]  omeprazole (PRILOSEC) 20 MG capsule TAKE 1 CAPSULE (20 MG TOTAL) BY MOUTH 2 (TWO) TIMES DAILY BEFORE A MEAL. Patient taking differently: as needed.  02/24/20   Malfi, Lupita Raider, FNP  sertraline (ZOLOFT) 100 MG tablet Take 1 tablet (100 mg total) by mouth daily. 01/06/20   Karamalegos, Devonne Doughty, DO  traZODone (DESYREL) 50 MG tablet Take 1 tablet (50 mg total) by mouth at bedtime as needed for sleep. Patient  taking differently: Take 50 mg by mouth at bedtime.  09/17/19   Mikey College, NP    Allergies Versed [midazolam] and Grapeseed extract [nutritional supplements]  Family History  Problem Relation Age of Onset  . Colon polyps Mother   . Hypertension Mother   . Goiter Mother   . AAA (abdominal aortic aneurysm) Father   . Alcohol abuse Father   . Healthy Sister   . Healthy Daughter   . Healthy Son   . Colon cancer Maternal Uncle   . Vision loss Maternal Grandmother   . Liver cancer Paternal Grandfather   . Alcohol abuse Paternal Grandfather   . Healthy Sister   . Healthy Sister   . Healthy Son   . Bladder Cancer Neg Hx   . Kidney cancer Neg Hx   . Prostate cancer Neg Hx   . Breast cancer Neg Hx     Social History Social History   Tobacco Use  . Smoking status: Never Smoker  . Smokeless tobacco: Never Used  Substance Use Topics  . Alcohol use: Not Currently    Comment: socially  . Drug use: No    Review of Systems Constitutional: No fever/chills Eyes: No visual changes. ENT: No sore throat. Cardiovascular: Denies chest pain. Respiratory: Denies shortness of breath. Gastrointestinal: Positive for lower abdominal and pelvic pain. Genitourinary: Positive for hematuria.  Musculoskeletal: Positive for back pain. Skin: Negative for rash. Neurological: Negative for headaches, focal weakness or numbness.  ____________________________________________   PHYSICAL EXAM:  VITAL SIGNS: ED Triage Vitals  Enc Vitals Group     BP 03/27/20 2141 (!) 165/104     Pulse Rate 03/27/20 2141 74     Resp 03/27/20 2141 18     Temp 03/27/20 2141 98 F (36.7 C)     Temp Source 03/27/20 2141 Oral     SpO2 03/27/20 2141 99 %     Weight 03/27/20 2148 121 lb (54.9 kg)     Height 03/27/20 2148 5' 5.5" (1.664 m)     Head Circumference --      Peak Flow --      Pain Score 03/27/20 2148 6   Constitutional: Alert and oriented.  Eyes: Conjunctivae are normal.  ENT      Head:  Normocephalic and atraumatic.      Nose: No congestion/rhinnorhea.      Mouth/Throat: Mucous membranes are moist.      Neck: No stridor. Hematological/Lymphatic/Immunilogical: No cervical lymphadenopathy. Cardiovascular: Normal rate, regular rhythm.  No murmurs, rubs, or gallops.  Respiratory: Normal respiratory effort without tachypnea nor retractions. Breath sounds are clear and equal bilaterally. No wheezes/rales/rhonchi. Gastrointestinal: Soft and minimally tender in the lower abdomen.  Genitourinary: Deferred Musculoskeletal: Normal range of motion in all extremities. No lower extremity edema. Neurologic:  Normal speech and language. No gross focal neurologic deficits are appreciated.  Skin:  Skin is warm, dry and intact. No rash noted. Psychiatric: Mood and affect are normal. Speech and behavior are normal. Patient exhibits appropriate insight and judgment.  ____________________________________________    LABS (pertinent positives/negatives)  CMP wnl except BUN 28 UA clear, moderate hgb dipstick otherwise unremarkable CBC wbc 8.8, hgb 14.3, plt 195  ____________________________________________   EKG  None  ____________________________________________    RADIOLOGY  CT renal Punctate stone in right kidney. No ureterolithiasis.   ____________________________________________   PROCEDURES  Procedures  ____________________________________________   INITIAL IMPRESSION / ASSESSMENT AND PLAN / ED COURSE  Pertinent labs & imaging results that were available during my care of the patient were reviewed by me and considered in my medical decision making (see chart for details).   Patient presented to the emergency department today at the advice of the after-hours nurse at her OB/GYN clinic.  It is unclear what the specific concern was.  The patient has been complaining of lower abdominal pain and hematuria for the past few weeks.  Urine here is consistent with hematuria.   No signs of infection.  CT renal was performed prior to my evaluation and this did not show any acute abnormalities.  I discussed with patient the findings of the CT scan.  At this time I think would be most reasonable for patient to follow-up with urology.  Will give her urology follow-up information.  Also give her Bentyl to try to see if that helps with the abdominal discomfort.  ____________________________________________   FINAL CLINICAL IMPRESSION(S) / ED DIAGNOSES  Final diagnoses:  Hematuria, unspecified type  Abdominal pain, unspecified abdominal location     Note: This dictation was prepared with Dragon dictation. Any transcriptional errors that result from this process are unintentional     Nance Pear, MD 03/28/20 [phone]

## 2020-03-29 ENCOUNTER — Telehealth: Payer: Self-pay | Admitting: Obstetrics and Gynecology

## 2020-03-29 NOTE — Telephone Encounter (Signed)
Patient called in saying she went to the ED Monday night because she was in quit a bit of pain and was having vaginal bleeding. Patient called saying her puss filled cyst has come back. Patient was wondering what she should do. Informed patient that I would leave a message for nurse but if symptoms persisted and worsened that she should take a trip to the ED to be on the cautious side. Patient verbalized understanding.

## 2020-03-30 ENCOUNTER — Ambulatory Visit (HOSPITAL_COMMUNITY): Payer: Self-pay | Admitting: Licensed Clinical Social Worker

## 2020-03-30 NOTE — Telephone Encounter (Signed)
Spoke with the pt concerning her call to the office. Pt was advised to allow the area to drain keep the area clean with mild soap and water and try a warm tub bath. Pt stated that she has an appt with an urologist tomorrow concerning her kidney stones. Pt was advised that if her cyst issues increase to please contact the office and schedule an appointment with [facility]. Pt voiced that she understood.

## 2020-03-31 ENCOUNTER — Ambulatory Visit: Payer: No Typology Code available for payment source | Admitting: Urology

## 2020-03-31 ENCOUNTER — Other Ambulatory Visit: Payer: Self-pay

## 2020-03-31 ENCOUNTER — Encounter: Payer: Self-pay | Admitting: Urology

## 2020-03-31 VITALS — BP 136/86 | HR 68 | Ht 65.5 in | Wt 122.0 lb

## 2020-03-31 DIAGNOSIS — N200 Calculus of kidney: Secondary | ICD-10-CM

## 2020-03-31 DIAGNOSIS — R310 Gross hematuria: Secondary | ICD-10-CM

## 2020-03-31 LAB — MICROSCOPIC EXAMINATION
Epithelial Cells (non renal): NONE SEEN /hpf (ref 0–10)
WBC, UA: NONE SEEN /hpf (ref 0–5)

## 2020-03-31 LAB — URINALYSIS, COMPLETE
Bilirubin, UA: NEGATIVE
Glucose, UA: NEGATIVE
Ketones, UA: NEGATIVE
Leukocytes,UA: NEGATIVE
Nitrite, UA: NEGATIVE
Protein,UA: NEGATIVE
Specific Gravity, UA: 1.015 (ref 1.005–1.030)
Urobilinogen, Ur: 0.2 mg/dL (ref 0.2–1.0)
pH, UA: 6.5 (ref 5.0–7.5)

## 2020-03-31 NOTE — Progress Notes (Signed)
03/31/2020 3:23 PM   Rigby Evern Core 08-Jul-1961 [id number]  Referring provider: Verl Bangs, [place],  [place] [address]  Chief Complaint  Patient presents with  . Hematuria  . Nephrolithiasis    HPI: Ms. Demayo was referred over for gross hematuria. She noticed pink urine Mar 2021 after some abx for a UTI. She also had pain in RLQ after she eats, which has been going on over 1 year. She saw "perfect yellow circles" in her vision. It sounds like she had a bartholins cyst or something like that I&D'd. She was sent to ED 03/28/2020.  UA at ED was clear. She had 0-5 red cells, 0-5 white cells, no bacteria.  White count was 8.8.  Creatinine 0.75.  She did undergo CT scan of the abdomen and pelvis (non-con) which overall was normal from a GU point of view.  There is a punctate right renal stone - may be on the renal papilla.  UA here with 3-10 rbc / hpf. She voids with a good flow. Non-smoker. No chemical, chemo or XRT exposure.    PMH: Past Medical History:  Diagnosis Date  . Allergy   . Anxiety   . Arthritis   . Cervical dystonia   . Depression   . Fibromyalgia   . Fibromyalgia   . GERD (gastroesophageal reflux disease)   . Glaucoma    closed angle glaucoma both eyes, laser treatment in past  . Goiter   . HLD (hyperlipidemia)    statin intolerance  . Hypertension   . Kidney stone   . 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 Plantar fasciitis   . Sleep apnea   . Sleep apnea   . Sleep apnea     Surgical History: Past Surgical History:  Procedure Laterality Date  . CHOLECYSTECTOMY    . COSMETIC SURGERY Left    cheek bone  . GALLBLADDER SURGERY    . TONSILLECTOMY    . TONSILLECTOMY      Home Medications:  Allergies as of 03/31/2020      Reactions   Versed [midazolam] Hives, Other (See Comments)   BP increased unable to talk BP increased unable to hear   Grapeseed Extract [nutritional Supplements] Hives   Grapes       Medication List       Accurate as of March 31, 2020  3:23 PM. If you have any questions, ask your nurse or doctor.        calcium carbonate 1250 (500 Ca) MG tablet Commonly known as: OS-CAL - dosed in mg of elemental calcium Take 1 tablet by mouth.   dicyclomine 10 MG capsule Commonly known as: Bentyl Take 1 capsule (10 mg total) by mouth 3 (three) times daily as needed for up to 14 days (abdominal pain).   Fish Oil 1000 MG Caps Take 1 capsule by mouth.   gabapentin 100 MG capsule Commonly known as: NEURONTIN TAKE 1 CAPSULE BY MOUTH THREE TIMES A DAY   hydrochlorothiazide 25 MG tablet Commonly known as: HYDRODIURIL Take 1 tablet (25 mg total) by mouth daily.   hydrochlorothiazide 12.5 MG tablet Commonly known as: HYDRODIURIL Take 12.5 mg by mouth daily.   lisinopril 10 MG tablet Commonly known as: ZESTRIL Take 1 tablet (10 mg total) by mouth daily.   Magnesium 250 MG Tabs Take 1 mg by mouth daily.   omeprazole 20 MG capsule Commonly known as: PRILOSEC TAKE 1 CAPSULE (20 MG  TOTAL) BY MOUTH 2 (TWO) TIMES DAILY BEFORE A MEAL. What changed: See the new instructions.   Premarin vaginal cream Generic drug: conjugated estrogens Place 1 Applicatorful vaginally 3 (three) times a week. Only apply pea-sized amount to external region.   sertraline 100 MG tablet Commonly known as: ZOLOFT Take 1 tablet (100 mg total) by mouth daily.   traZODone 50 MG tablet Commonly known as: DESYREL Take 1 tablet (50 mg total) by mouth at bedtime as needed for sleep. What changed: when to take this       Allergies:  Allergies  Allergen Reactions  . Versed [Midazolam] Hives and Other (See Comments)    BP increased unable to talk BP increased unable to hear  . Grapeseed Extract [Nutritional Supplements] Hives    Grapes     Family History: Family History  Problem Relation Age of Onset  . Colon polyps Mother   . Hypertension Mother   . Goiter Mother   . AAA  (abdominal aortic aneurysm) Father   . Alcohol abuse Father   . Healthy Sister   . Healthy Daughter   . Healthy Son   . Colon cancer Maternal Uncle   . Vision loss Maternal Grandmother   . Liver cancer Paternal Grandfather   . Alcohol abuse Paternal Grandfather   . Healthy Sister   . Healthy Sister   . Healthy Son   . Bladder Cancer Neg Hx   . Kidney cancer Neg Hx   . Prostate cancer Neg Hx   . Breast cancer Neg Hx     Social History:  reports that she has never smoked. She has never used smokeless tobacco. She reports previous alcohol use. She reports that she does not use drugs.   Physical Exam: BP 136/86 (BP Location: Left Arm, Patient Position: Sitting, Cuff Size: Normal)   Pulse 68   Ht 5' 5.5" (1.664 m)   Wt 122 lb (55.3 kg)   LMP 02/03/2020   BMI 19.99 kg/m   Constitutional:  Alert and oriented, No acute distress. HEENT: [place] AT, moist mucus membranes.  Trachea midline, no masses. Cardiovascular: No clubbing, cyanosis, or edema. Respiratory: Normal respiratory effort, no increased work of breathing. GI: Abdomen is soft, nontender, nondistended, no abdominal masses GU: No CVA tenderness Skin: No rashes, bruises or suspicious lesions. Neurologic: Grossly intact, no focal deficits, moving all 4 extremities. Psychiatric: Normal mood and affect.  Laboratory Data: Lab Results  Component Value Date   WBC 8.8 03/27/2020   HGB 14.3 03/27/2020   HCT 44.0 03/27/2020   MCV 90.5 03/27/2020   PLT 195 03/27/2020    Lab Results  Component Value Date   CREATININE 0.75 03/27/2020    No results found for: PSA  No results found for: TESTOSTERONE  Lab Results  Component Value Date   HGBA1C 5.3 02/26/2018    Urinalysis    Component Value Date/Time   COLORURINE AMBER (A) 03/27/2020 2153   APPEARANCEUR CLEAR (A) 03/27/2020 2153   APPEARANCEUR Cloudy (A) 03/06/2017 0942   LABSPEC 1.019 03/27/2020 2153   PHURINE 5.0 03/27/2020 2153   GLUCOSEU NEGATIVE 03/27/2020 2153    HGBUR MODERATE (A) 03/27/2020 2153   BILIRUBINUR NEGATIVE 03/27/2020 2153   BILIRUBINUR negative 03/20/2020 1437   BILIRUBINUR Negative 03/06/2017 0942   KETONESUR NEGATIVE 03/27/2020 2153   PROTEINUR NEGATIVE 03/27/2020 2153   UROBILINOGEN 0.2 03/20/2020 1437   NITRITE NEGATIVE 03/27/2020 2153   LEUKOCYTESUR NEGATIVE 03/27/2020 2153    Lab Results  Component Value Date  LABMICR See below: 03/06/2017   WBCUA 0-5 03/06/2017   RBCUA None seen 03/06/2017   LABEPIT >10 (H) 03/06/2017   BACTERIA NONE SEEN 03/27/2020    Pertinent Imaging: CT images - I reviewed images  No results found for this or any previous visit. No results found for this or any previous visit. No results found for this or any previous visit. No results found for this or any previous visit. No results found for this or any previous visit. No results found for this or any previous visit. No results found for this or any previous visit. Results for orders placed during the hospital encounter of 03/28/20  CT Renal Stone Study   Narrative CLINICAL DATA:  Hematuria  EXAM: CT ABDOMEN AND PELVIS WITHOUT CONTRAST  TECHNIQUE: Multidetector CT imaging of the abdomen and pelvis was performed following the standard protocol without IV contrast.  COMPARISON:  CT 05/07/2018  FINDINGS: Lower chest: Lung bases demonstrate no acute consolidation or effusion. The heart size is within normal limits.  Hepatobiliary: Status post cholecystectomy. No focal hepatic abnormality.  Pancreas: Unremarkable. No pancreatic ductal dilatation or surrounding inflammatory changes.  Spleen: Normal in size without focal abnormality.  Adrenals/Urinary Tract: Adrenal glands are normal. Punctate stone in the upper pole of the right kidney. No hydronephrosis or ureteral stone.  Stomach/Bowel: Stomach is within normal limits. Appendix appears normal. No evidence of bowel wall thickening, distention, or inflammatory  changes.  Vascular/Lymphatic: No significant vascular findings are present. No enlarged abdominal or pelvic lymph nodes.  Reproductive: Uterus and bilateral adnexa are unremarkable.  Other: No abdominal wall hernia or abnormality. No abdominopelvic ascites.  Musculoskeletal: No acute or significant osseous findings.  IMPRESSION: 1. Punctate stone within the right kidney. Negative for hydronephrosis or ureteral stone. 2. No CT evidence for acute intra-abdominal or pelvic abnormality   Electronically Signed   By: Donavan Foil M.D.   On: 03/28/2020 00:59     Assessment & Plan:    1. Nephrolithiasis Not clinically significant  - Urinalysis, Complete  2. Gross hematuria - CT overall benign. F/u for cystoscopy.   No follow-ups on file.  Festus Aloe, MD  [facility] [address], [place], [place] [address] [phone]

## 2020-03-31 NOTE — Patient Instructions (Addendum)
Cystoscopy Cystoscopy is a procedure that is used to help diagnose and sometimes treat conditions that affect the lower urinary tract. The lower urinary tract includes the bladder and the urethra. The urethra is the tube that drains urine from the bladder. Cystoscopy is done using a thin, tube-shaped instrument with a light and camera at the end (cystoscope). The cystoscope may be hard or flexible, depending on the goal of the procedure. The cystoscope is inserted through the urethra, into the bladder. Cystoscopy may be recommended if you have:  Urinary tract infections that keep coming back.  Blood in the urine (hematuria).  An inability to control when you urinate (urinary incontinence) or an overactive bladder.  Unusual cells found in a urine sample.  A blockage in the urethra, such as a urinary stone.  Painful urination.  An abnormality in the bladder found during an intravenous pyelogram (IVP) or CT scan. Cystoscopy may also be done to remove a sample of tissue to be examined under a microscope (biopsy). What are the risks? Generally, this is a safe procedure. However, problems may occur, including:  Infection.  Bleeding.  What happens during the procedure?  1. You will be given one or more of the following: ? A medicine to numb the area (local anesthetic). 2. The area around the opening of your urethra will be cleaned. 3. The cystoscope will be passed through your urethra into your bladder. 4. Germ-free (sterile) fluid will flow through the cystoscope to fill your bladder. The fluid will stretch your bladder so that your health care provider can clearly examine your bladder walls. 5. Your doctor will look at the urethra and bladder. 6. The cystoscope will be removed The procedure may vary among health care providers  What can I expect after the procedure? After the procedure, it is common to have: 1. Some soreness or pain in your abdomen and urethra. 2. Urinary symptoms.  These include: ? Mild pain or burning when you urinate. Pain should stop within a few minutes after you urinate. This may last for up to 1 week. ? A small amount of blood in your urine for several days. ? Feeling like you need to urinate but producing only a small amount of urine. Follow these instructions at home: General instructions  Return to your normal activities as told by your health care provider.   Do not drive for 24 hours if you were given a sedative during your procedure.  Watch for any blood in your urine. If the amount of blood in your urine increases, call your health care provider.  If a tissue sample was removed for testing (biopsy) during your procedure, it is up to you to get your test results. Ask your health care provider, or the department that is doing the test, when your results will be ready.  Drink enough fluid to keep your urine pale yellow.  Keep all follow-up visits as told by your health care provider. This is important. Contact a health care provider if you:  Have pain that gets worse or does not get better with medicine, especially pain when you urinate.  Have trouble urinating.  Have more blood in your urine. Get help right away if you:  Have blood clots in your urine.  Have abdominal pain.  Have a fever or chills.  Are unable to urinate. Summary  Cystoscopy is a procedure that is used to help diagnose and sometimes treat conditions that affect the lower urinary tract.  Cystoscopy is done using   a thin, tube-shaped instrument with a light and camera at the end.  After the procedure, it is common to have some soreness or pain in your abdomen and urethra.  Watch for any blood in your urine. If the amount of blood in your urine increases, call your health care provider.  If you were prescribed an antibiotic medicine, take it as told by your health care provider. Do not stop taking the antibiotic even if you start to feel better. This  information is not intended to replace advice given to you by your health care provider. Make sure you discuss any questions you have with your health care provider. Document Revised: 11/24/2018 Document Reviewed: 11/24/2018 Elsevier Patient Education  2020 [employer] Inc[employer]   

## 2020-04-02 LAB — URINE CULTURE

## 2020-04-11 ENCOUNTER — Ambulatory Visit: Payer: No Typology Code available for payment source | Admitting: Urology

## 2020-04-13 ENCOUNTER — Ambulatory Visit: Payer: Self-pay | Admitting: Psychiatry

## 2020-04-14 ENCOUNTER — Ambulatory Visit (INDEPENDENT_AMBULATORY_CARE_PROVIDER_SITE_OTHER): Payer: No Typology Code available for payment source | Admitting: Urology

## 2020-04-14 ENCOUNTER — Other Ambulatory Visit: Payer: Self-pay

## 2020-04-14 VITALS — BP 166/95 | HR 65 | Ht 65.5 in | Wt 122.0 lb

## 2020-04-14 DIAGNOSIS — R310 Gross hematuria: Secondary | ICD-10-CM

## 2020-04-14 MED ORDER — LIDOCAINE HCL URETHRAL/MUCOSAL 2 % EX GEL
1.0000 "application " | Freq: Once | CUTANEOUS | Status: AC
  Administered 2020-04-14: 1 via URETHRAL

## 2020-04-14 NOTE — Progress Notes (Signed)
   04/14/20  CC: No chief complaint on file.   HPI:  Denise Elliott follows up for gross hematuria. She noticed pink urine Mar 2021 after some abx for a UTI. She also had pain in RLQ after she eats, which has been going on over 1 year. She saw "perfect yellow circles" in her vision. It sounds like she had a bartholins cyst or something like that I&D'd. She was sent to ED 03/28/2020.  UA at ED was clear. She had 0-5 red cells, 0-5 white cells, no bacteria.  White count was 8.8.  Creatinine 0.75.  She did undergo CT scan of the abdomen and pelvis (non-con) which overall was normal from a GU point of view.  There is a punctate right renal stone - may be on the renal papilla. F/u UA at GU clinic with 3-10 rbc / hpf. She voids with a good flow. Non-smoker. No chemical, chemo or XRT exposure.   She is well today and here for cystoscopy.   Last menstrual period 02/03/2020. NED. A&Ox3.   No respiratory distress   Abd soft, NT, ND Normal external genitalia with patent urethral meatus  Cystoscopy Procedure Note  Patient identification was confirmed, informed consent was obtained, and patient was prepped using Betadine solution.  Lidocaine jelly was administered per urethral meatus.    Procedure: - Flexible cystoscope introduced, without any difficulty.   - Thorough search of the bladder revealed:    normal urethral meatus    normal urothelium    no stones    no ulcers     no tumors    no urethral polyps    no trabeculation  - Ureteral orifices were normal in position and appearance.  Post-Procedure: - Patient tolerated the procedure well -Charlott Rakes was chaperone for exam and cystoscopy  Assessment/ Plan:  Hematuria - see back for UA in 6 mo and then PRN if clear.    No follow-ups on file.  Festus Aloe, MD

## 2020-04-14 NOTE — Patient Instructions (Signed)

## 2020-04-17 LAB — MICROSCOPIC EXAMINATION

## 2020-04-17 LAB — URINALYSIS, COMPLETE
Bilirubin, UA: NEGATIVE
Glucose, UA: NEGATIVE
Ketones, UA: NEGATIVE
Leukocytes,UA: NEGATIVE
Nitrite, UA: NEGATIVE
Protein,UA: NEGATIVE
Specific Gravity, UA: 1.02 (ref 1.005–1.030)
Urobilinogen, Ur: 0.2 mg/dL (ref 0.2–1.0)
pH, UA: 7 (ref 5.0–7.5)

## 2020-04-25 ENCOUNTER — Telehealth: Payer: Self-pay

## 2020-04-25 ENCOUNTER — Ambulatory Visit: Payer: Self-pay

## 2020-04-25 NOTE — Telephone Encounter (Signed)
Called patient and she states that she is having all over joint pain.  She has pain and numbness to her feet heals toes. She rates her pain at 9.  She states that this pain has been an ongoing issue.  She has been Dx with fibromyalgia. She states that her current medication is not working.  She states she continues with inner ear problems that cause her to be unsteady and nauseated. She states that her unsteadiness has improved but she still has the nausea and dizziness. She states that she has swelling is some joints in her fingers and toes. Per protocol patient was scheduled appointment tomorrow AM.   Reason for Disposition . Numbness in a leg or foot (i.e., loss of sensation)  Answer Assessment - Initial Assessment Questions 1. ONSET: "When did the pain start?"      Not new 2. LOCATION: "Where is the pain located?"      leg feet, hands, toes  3. PAIN: "How bad is the pain?"    (Scale 1-10; or mild, moderate, severe)   -  MILD (1-3): doesn't interfere with normal activities    -  MODERATE (4-7): interferes with normal activities (e.g., work or school) or awakens from sleep, limping    -  SEVERE (8-10): excruciating pain, unable to do any normal activities, unable to walk     9  In joints  4. WORK OR EXERCISE: "Has there been any recent work or exercise that involved this part of the body?"      no 5. CAUSE: "What do you think is causing the leg pain?"     Unsure seen podiatrist 6. OTHER SYMPTOMS: "Do you have any other symptoms?" (e.g., chest pain, back pain, breathing difficulty, swelling, rash, fever, numbness, weakness)     Dizziness, nausea, off balance from inner ear problem 7. PREGNANCY: "Is there any chance you are pregnant?" "When was your last menstrual period?"    N/A  Protocols used: LEG PAIN-A-AH

## 2020-04-25 NOTE — Telephone Encounter (Signed)
Copied from [place] [id number]. Topic: General - Inquiry >> Apr 24, 2020  5:03 PM Greggory Keen D wrote: Reason for CRM: Pt called saying she has muscle and joint pain and is taking Gabapentin 3 times and day and she said she is still having pain.  Please Advise [phone]  I attempted to contact the patient to get more detail about the muscle and joint pain. The patient has a history of joint/ muscle pain. Please have the triage nurse to assess and schedule an appt with the patient if necessary.    I need to find out the onset of the pain, is this new or just a pain that isn't improving or worsening?  What is the severity of the pain?  Is the pain constant or intermittent?  Please offer the patient an appt to come in to address the pain if necessary.

## 2020-04-25 NOTE — Telephone Encounter (Signed)
Patient called back states that she is still waiting on a call back from the Dr about the issues she called about on 04/24/20. Please Advise Ph# [phone]

## 2020-04-25 NOTE — Telephone Encounter (Signed)
See triage note.  Appointment tomorrow.

## 2020-04-26 ENCOUNTER — Encounter: Payer: Self-pay | Admitting: Family Medicine

## 2020-04-26 ENCOUNTER — Ambulatory Visit (INDEPENDENT_AMBULATORY_CARE_PROVIDER_SITE_OTHER): Payer: No Typology Code available for payment source | Admitting: Family Medicine

## 2020-04-26 ENCOUNTER — Other Ambulatory Visit: Payer: Self-pay

## 2020-04-26 VITALS — BP 126/69 | HR 73 | Temp 97.3°F | Ht 65.5 in | Wt 129.2 lb

## 2020-04-26 DIAGNOSIS — M2550 Pain in unspecified joint: Secondary | ICD-10-CM | POA: Diagnosis not present

## 2020-04-26 DIAGNOSIS — T148XXA Other injury of unspecified body region, initial encounter: Secondary | ICD-10-CM

## 2020-04-26 DIAGNOSIS — Z1211 Encounter for screening for malignant neoplasm of colon: Secondary | ICD-10-CM | POA: Insufficient documentation

## 2020-04-26 DIAGNOSIS — E782 Mixed hyperlipidemia: Secondary | ICD-10-CM | POA: Diagnosis not present

## 2020-04-26 DIAGNOSIS — B351 Tinea unguium: Secondary | ICD-10-CM | POA: Insufficient documentation

## 2020-04-26 DIAGNOSIS — L299 Pruritus, unspecified: Secondary | ICD-10-CM

## 2020-04-26 DIAGNOSIS — F419 Anxiety disorder, unspecified: Secondary | ICD-10-CM

## 2020-04-26 DIAGNOSIS — I10 Essential (primary) hypertension: Secondary | ICD-10-CM | POA: Diagnosis not present

## 2020-04-26 DIAGNOSIS — Z789 Other specified health status: Secondary | ICD-10-CM

## 2020-04-26 DIAGNOSIS — R42 Dizziness and giddiness: Secondary | ICD-10-CM | POA: Insufficient documentation

## 2020-04-26 DIAGNOSIS — H818X9 Other disorders of vestibular function, unspecified ear: Secondary | ICD-10-CM

## 2020-04-26 MED ORDER — HYDROXYZINE HCL 10 MG PO TABS: 10.0000 mg | ORAL_TABLET | Freq: Every evening | ORAL | 0 refills | Status: DC | PRN

## 2020-04-26 NOTE — Patient Instructions (Addendum)
As we discussed, have your labs drawn in the next 1-2 weeks and we will contact you with the results.  I have put in a referral to rheumatology for evaluation of chronic joint pain.  If needed, we can then refer to pain management for a   I have put in a referral for your colonoscopy.  Once your labs have resulted, I can send in the prescription for terbinafine for the toenail fungus.  I have sent in a prescription for hydroxyzine to take at bedtime to help with nighttime itching.  Follow up with your Neurologist for the worsening symptoms.  Sleep hygiene is the single most effective treatment for sleep issues, but it is hard work.  Tips for a good night's sleep:  -Keep sleep environment comfortable and conducive to sleep -Keep regular sleep schedule 7 nights a week -Avoiding naps during the day -Avoiding going to bed until drowsy and ready to sleep, not trying to sleep, and not watching the clock -Get out of bed if not asleep within 15-20 minutes and returning only when drowsy -Avoiding caffeine, nicotine, alcohol, and other substances that interfere with sleep before bedtime -Take an hour before your set bedtime and start to wind down: bath/shower, no more TV or phone (the blue light can interfere with sleeping), listen to soothing music, or meditation -No TV in your bedroom -Exercising regularly, at least 6 hours before sleep. Yoga and Tai Chi can improve sleep quality  There are a lot of books and apps that may help guide you with any of the following:   -Progressive muscle relaxation (involves methodical tension and relaxation of different Muscle groups throughout body)  Guided imagery  -YouTube - Gwynne Edinger has free videos on YouTube that can help with meditation and some   Abdominal breathing   Over the counter sleep aid one hour before bed- and gradually wean your use over 2-4 weeks  Some examples are : *Melatonin 5-10 mg *Sleepology (Can find on [employer]) taken  according to packaging directions  There are a few online evidence based online programs, unfortunately they are not free.   Developed by a sleep expert who created a drug-free program for insomnia proven more effective than sleeping pills.  www.cbtforinsomnia.com Sleepio is an evidence-based digital sleep improvement program   www.sleepio.com SHUTi is designed to actively help retrain your body and mind for great sleep through six engaging Cognitive Behavioral Therapy for Insomnia strategy and learning sessions  [email or web address]   We will plan to see you back in 4 weeks for re-evaluation of toe nails and hypertension  You will receive a survey after today's visit either digitally by e-mail or paper by [place] mail. Your experiences and feedback matter to [place].  Please respond so we know how we are doing as we provide care for you.  Call us with any questions/concerns/needs.  It is my goal to be available to you for your health concerns.  Thanks for choosing me to be a partner in your healthcare needs!  Harlin Rain, FNP-C Family Nurse Practitioner [facility] Phone: [phone]

## 2020-04-26 NOTE — Assessment & Plan Note (Signed)
Stable and well controlled hypertension.  BP is at goal < 130/80.  Pt is not working on lifestyle modifications.  Taking medications tolerating well without side effects. No known complications.  Plan: 1. Continue taking hydroxyzine 25mg  daily and lisinopril 10mg  daily 2. Obtain labs ordered today in the next 7 days  3. Encouraged heart healthy diet and increasing exercise to 30 minutes most days of the week, going no more than 2 days in a row without exercise. 4. Check BP 1-2 x per week at home, keep log, and bring to clinic at next appointment. 5. Follow up 3 months.

## 2020-04-26 NOTE — Assessment & Plan Note (Signed)
Pt requiring colon cancer screening.  Family history of colon cancer.  Plan: - Discussed timing for initiation of colon cancer screening ACS vs USPSTF guidelines - Referral to GI placed.

## 2020-04-26 NOTE — Progress Notes (Signed)
Subjective:    Patient ID: Denise Elliott, female    DOB: 1961-06-01, 59 y.o.   MRN: [id number]  Denise Elliott is a 59 y.o. female presenting on 04/26/2020 for Joint Pain (chronic joint pain in multiple sites w/ not relief from the Gabapentin. The pt state she increased it to four times a day instead of three times a day.)   HPI  Hypertension - She is not checking BP at home or outside of clinic.    - Current medications: hydrochlorothiazide 88m daily and lisinopril 128mdaily, tolerating well without side effects - She is not currently symptomatic. - Pt denies headache, lightheadedness, changes in vision, chest tightness/pressure, palpitations, leg swelling, sudden loss of speech or loss of consciousness. - She  reports no regular exercise routine. - Her diet is moderate in salt, moderate in fat, and moderate in carbohydrates.  Reports concerns of whole body joint pain.  Has been taking gabapentin 10033mID without relief of symptoms.  Has reported diagnosis of Fibromyalgia without having met with fibromyalgia specialist in the past.  Patient has concerns for psoriatic arthritis, states was diagnosed with "sausage digit" in her foot, with ANA and CCP negative in the past, no dermatology dx of psoriasis in the past.  Denies warmth, swelling, redness of joints.  Reports continued concerns for dizziness.  [facility] > 1 year ago with reports of worsening symptoms.  Discussed to follow up with Dr. ShaManuella GhaziReports concerns for toenail flaking and thickening.  No concerns for infection, has not found anything that has helped her symptoms.  Dep[facility] screen [facility] 03/20/2020 07/16/2019 06/17/2019  Decreased Interest 0 1 1  Down, Depressed, Hopeless _0 PHQ - 2 Score _1 Altered sleeping 0 3 3  Tired, decreased energy _2 Change in appetite _3 Feeling bad or failure about yourself  0 0 0  Trouble concentrating _4 Moving slowly or fidgety/restless 2 0 0  Suicidal thoughts 0 0 0  PHQ-9 Score _5 Difficult doing work/chores Somewhat difficult Somewhat difficult Somewhat difficult    Social History   Tobacco Use  . Smoking status: Never Smoker  . Smokeless tobacco: Never Used  Substance Use Topics  . Alcohol use: Not Currently    Comment: socially  . Drug use: No    Review of Systems  Constitutional: Negative.   HENT: Negative.   Eyes: Negative.   Respiratory: Negative.   Cardiovascular: Negative.   Gastrointestinal: Negative.   Endocrine: Negative.   Genitourinary: Negative.   Musculoskeletal: Positive for arthralgias and myalgias. Negative for back pain, gait problem, joint swelling, neck pain and neck stiffness.  Skin: Negative.   Allergic/Immunologic: Negative.   Neurological: Positive for dizziness. Negative for tremors, seizures, syncope, facial asymmetry, speech difficulty, weakness, light-headedness, numbness and headaches.  Hematological: Negative.   Psychiatric/Behavioral: Positive for dysphoric mood. Negative for agitation, behavioral problems, confusion, decreased concentration, hallucinations, self-injury, sleep disturbance and suicidal ideas. The patient is nervous/anxious. The patient is not hyperactive.    Per HPI unless specifically indicated above     Objective:    BP 126/69   Pulse 73   Temp (!) 97.3 F (36.3 C) (Temporal)   Ht 5' 5.5" (1.664 m)   Wt 129 lb 3.2 oz (58.6 kg)   LMP 02/03/2020   BMI 21.17 kg/m   Wt Readings from  Last 3 Encounters:  04/26/20 129 lb 3.2 oz (58.6 kg)  04/14/20 122 lb (55.3 kg)  03/31/20 122 lb (55.3 kg)    Physical Exam Vitals reviewed.  Constitutional:      General: She is not in acute distress.    Appearance: Normal appearance. She is well-developed, well-groomed and normal weight. She is not ill-appearing or toxic-appearing.  HENT:     Head: Normocephalic.  Eyes:     General: Lids are normal. Vision grossly  intact.        Right eye: No discharge.        Left eye: No discharge.     Extraocular Movements: Extraocular movements intact.     Conjunctiva/sclera: Conjunctivae normal.     Pupils: Pupils are equal, round, and reactive to light.  Cardiovascular:     Rate and Rhythm: Normal rate and regular rhythm.     Pulses: Normal pulses.          Dorsalis pedis pulses are 2+ on the right side and 2+ on the left side.     Heart sounds: Normal heart sounds. No murmur. No friction rub. No gallop.   Pulmonary:     Effort: Pulmonary effort is normal. No respiratory distress.     Breath sounds: Normal breath sounds.  Musculoskeletal:        General: No swelling, tenderness or deformity. Normal range of motion.     Right lower leg: No edema.     Left lower leg: No edema.  Feet:     Right foot:     Skin integrity: Skin integrity normal.     Toenail Condition: Right toenails are abnormally thick. Fungal disease present.    Left foot:     Skin integrity: Skin integrity normal.     Toenail Condition: Fungal disease present. Skin:    General: Skin is warm and dry.     Capillary Refill: Capillary refill takes less than 2 seconds.  Neurological:     General: No focal deficit present.     Mental Status: She is alert and oriented to person, place, and time.     Cranial Nerves: No cranial nerve deficit.     Sensory: No sensory deficit.     Motor: No weakness.     Coordination: Coordination normal.     Gait: Gait normal.  Psychiatric:        Attention and Perception: Attention and perception normal.        Mood and Affect: Mood is anxious. Affect is tearful.        Speech: Speech normal.        Behavior: Behavior normal. Behavior is cooperative.        Thought Content: Thought content normal.        Cognition and Memory: Cognition and memory normal.        Judgment: Judgment normal.     Results for orders placed or performed in visit on 04/14/20  Microscopic Examination   URINE  Result Value  Ref Range   WBC, UA 0-5 0 - 5 /hpf   RBC 0-3 0 - 2 /hpf   Epithelial Cells (non renal) 0-10 0 - 10 /hpf   Bacteria, UA Few None seen/Few  Urinalysis, Complete  Result Value Ref Range   Specific Gravity, UA 1.020 1.005 - 1.030   pH, UA 7.0 5.0 - 7.5   Color, UA Yellow Yellow   Appearance Ur Clear Clear   Leukocytes,UA Negative Negative   Protein,UA Negative Negative/Trace  Glucose, UA Negative Negative   Ketones, UA Negative Negative   RBC, UA Trace (A) Negative   Bilirubin, UA Negative Negative   Urobilinogen, Ur 0.2 0.2 - 1.0 mg/dL   Nitrite, UA Negative Negative   Microscopic Examination See below:       Assessment & Plan:   Problem List Items Addressed This Visit      Cardiovascular and Mediastinum   Hypertension - Primary    Stable and well controlled hypertension.  BP is at goal < 130/80.  Pt is not working on lifestyle modifications.  Taking medications tolerating well without side effects. No known complications.  Plan: 1. Continue taking hydroxyzine 25mg daily and lisinopril 10mg daily 2. Obtain labs ordered today in the next 7 days  3. Encouraged heart healthy diet and increasing exercise to 30 minutes most days of the week, going no more than 2 days in a row without exercise. 4. Check BP 1-2 x per week at home, keep log, and bring to clinic at next appointment. 5. Follow up 3 months.         Relevant Medications   aspirin 81 MG chewable tablet   Other Relevant Orders   COMPLETE METABOLIC PANEL WITH GFR   CBC with Differential   Lipid Profile     Musculoskeletal and Integument   Toenail fungus    Right foot 5th digit nail with thickening and width narrowing of toenail with toenail fungus.  Multiple other nails with flaking.  Discussed will have labs drawn for AST/ALT labs and can begin on terbinafine for 12 weeks, having labs completed every 4 weeks for AST/ALT evaluation.    Plan: 1. Have labs completed and once AST/ALT labs resulted, will send in 4  weeks of Terbinafine at a time with labs completed every 4 weeks        Other   Anxiety    Reports has met with Sonya Ellington in [place], [place] and is going to be establishing with a psychiatrist.  Reports is taking sertraline and tolerating well without known side effect.  Discussed is waking up in the morning with excoriations on the back of her neck/upper back and believes that she may be scratching at night.  Discussed use of hydroxyzine at bedtime, as can help with anxiety, but also itching.  Cautioned can increase dizziness so only to take at bedtime.  Plan: 1. Continue sertraline 100mg daily 2. BEGIN Hydroxyzine 10mg nightly, as needed for anxiety/itching 3. Keep appointments with Sonya Ellington and become established with psychiatry 4. Follow up in 3 months      Relevant Medications   hydrOXYzine (ATARAX/VISTARIL) 10 MG tablet   HLD (hyperlipidemia)    Status unknown, last check 12/2017.  Recheck labs.  Has statin intolerance.  Based on lab results will discuss fish oil 1000mg once daily for some treatment and if elevated can begin to look at injectable repatha and additional options.      Relevant Medications   aspirin 81 MG chewable tablet   Other Relevant Orders   Lipid Profile   Persistent postural-perceptual dizziness    Patient with continued concerns for dizziness.  Chart reviewed for Dr. Shah's notes from [facility] on 02/19/2019.  "had been dx with PPPD (persistent postural perceptual dizziness) dx if symptoms present greater than 3 months, > 15 days per month, Symptoms are worse with upright posture (standing or sitting upright), head or body motion, exposure to complex or motion-rich environments. PPPD may coexist with other vestibular disorders. SSRI   and SNRI is likely to be more beneficial than benzodiazepine medications. Ongoing vestibular rehab is also important.  Dr. John T. McElveen, Jr., MD at [facility] is an  Otologist/Neurotologist office located in [place], [place] which specializes in intractable dizziness. For more information: Phone: [phone]; Fax: [phone]; [email or web address]; [address], [address], [place],  [address]."  Patient reports having met with Dr. McElveen and having testing done without change in her symptoms.  Discussed to contact Dr. Shah for re-evaluation appointment.      Joint pain    Pt with concerns for psoriatic arthritis, with previous dx of "sausage toe" on her foot.  Reports all over body joint pain and late 2020 CCP and ANA labs were negative, will repeat labs and add RH factor.  Plan: 1. Have labs completed in the next week 2. Referral placed to Rheumatology for additional evaluation      Relevant Orders   Rheumatoid factor   Antinuclear Antib (ANA)   Cyclic citrul peptide antibody, IgG   Ambulatory referral to Rheumatology   Screening for colon cancer    Pt requiring colon cancer screening.  Family history of colon cancer.  Plan: - Discussed timing for initiation of colon cancer screening ACS vs USPSTF guidelines - Referral to GI placed.       Relevant Orders   Ambulatory referral to Gastroenterology   Statin intolerance    Other Visit Diagnoses    Bruising       Relevant Orders   CBC with Differential   Itching       Relevant Medications   hydrOXYzine (ATARAX/VISTARIL) 10 MG tablet      Meds ordered this encounter  Medications  . hydrOXYzine (ATARAX/VISTARIL) 10 MG tablet    Sig: Take 1 tablet (10 mg total) by mouth at bedtime as needed.    Dispense:  30 tablet    Refill:  0      Follow up plan: Return in about 4 weeks (around 05/24/2020) for Joint pain follow up.   Nicole Marie Malfi, FNP Family Nurse Practitioner [facility] 04/26/2020, 12:28 PM  

## 2020-04-26 NOTE — Assessment & Plan Note (Signed)
Right foot 5th digit nail with thickening and width narrowing of toenail with toenail fungus.  Multiple other nails with flaking.  Discussed will have labs drawn for AST/ALT labs and can begin on terbinafine for 12 weeks, having labs completed every 4 weeks for AST/ALT evaluation.    Plan: 1. Have labs completed and once AST/ALT labs resulted, will send in 4 weeks of Terbinafine at a time with labs completed every 4 weeks

## 2020-04-26 NOTE — Assessment & Plan Note (Signed)
Status unknown, last check 12/2017.  Recheck labs.  Has statin intolerance.  Based on lab results will discuss fish oil 1000mg  once daily for some treatment and if elevated can begin to look at injectable repatha and additional options.

## 2020-04-26 NOTE — Assessment & Plan Note (Signed)
Patient with continued concerns for dizziness.  Chart reviewed for Dr. Trena Platt notes from [facility] on 02/19/2019.  "had been dx with PPPD (persistent postural perceptual dizziness) dx if symptoms present greater than 3 months, > 15 days per month, Symptoms are worse with upright posture (standing or sitting upright), head or body motion, exposure to complex or motion-rich environments. PPPD may coexist with other vestibular disorders. SSRI and SNRI is likely to be more beneficial than benzodiazepine medications. Ongoing vestibular rehab is also important.  Dr. Christene Lye, Brooke Bonito., MD at [facility] is an Otologist/Neurotologist office located in [place], [place] which specializes in intractable dizziness. For more information: Phone: [phone]; Fax: [phone]; [email or web address]; [address], [address], [place], [place] [address]."  Patient reports having met with Dr. Freddy Jaksch and having testing done without change in her symptoms.  Discussed to contact Dr. Manuella Ghazi for re-evaluation appointment.

## 2020-04-26 NOTE — Assessment & Plan Note (Signed)
Reports has met with Denise Elliott in [place], [place] and is going to be establishing with a psychiatrist.  Reports is taking sertraline and tolerating well without known side effect.  Discussed is waking up in the morning with excoriations on the back of her neck/upper back and believes that she may be scratching at night.  Discussed use of hydroxyzine at bedtime, as can help with anxiety, but also itching.  Cautioned can increase dizziness so only to take at bedtime.  Plan: 1. Continue sertraline 170m daily 2. BEGIN Hydroxyzine 170mnightly, as needed for anxiety/itching 3. Keep appointments with [facility] become established with psychiatry 4. Follow up in 3 months

## 2020-04-26 NOTE — Assessment & Plan Note (Signed)
Pt with concerns for psoriatic arthritis, with previous dx of "sausage toe" on her foot.  Reports all over body joint pain and late 2020 CCP and ANA labs were negative, will repeat labs and add RH factor.  Plan: 1. Have labs completed in the next week 2. Referral placed to Rheumatology for additional evaluation

## 2020-04-27 ENCOUNTER — Other Ambulatory Visit: Payer: No Typology Code available for payment source

## 2020-04-28 ENCOUNTER — Other Ambulatory Visit: Payer: Self-pay | Admitting: Family Medicine

## 2020-04-28 DIAGNOSIS — B351 Tinea unguium: Secondary | ICD-10-CM

## 2020-04-28 LAB — COMPLETE METABOLIC PANEL WITH GFR
AG Ratio: 1.8 (calc) (ref 1.0–2.5)
ALT: 27 U/L (ref 6–29)
AST: 30 U/L (ref 10–35)
Albumin: 4.6 g/dL (ref 3.6–5.1)
Alkaline phosphatase (APISO): 65 U/L (ref 37–153)
BUN: 15 mg/dL (ref 7–25)
CO2: 29 mmol/L (ref 20–32)
Calcium: 9.6 mg/dL (ref 8.6–10.4)
Chloride: 99 mmol/L (ref 98–110)
Creat: 0.74 mg/dL (ref 0.50–0.99)
GFR, Est African American: 102 mL/min/{1.73_m2} (ref >=60)
GFR, Est Non African American: 88 mL/min/{1.73_m2} (ref >=60)
Globulin: 2.5 g/dL (calc) (ref 1.9–3.7)
Glucose, Bld: 100 mg/dL — ABNORMAL HIGH (ref 65–99)
Potassium: 5.4 mmol/L — ABNORMAL HIGH (ref 3.5–5.3)
Sodium: 137 mmol/L (ref 135–146)
Total Bilirubin: 0.6 mg/dL (ref 0.2–1.2)
Total Protein: 7.1 g/dL (ref 6.1–8.1)

## 2020-04-28 LAB — CBC WITH DIFFERENTIAL/PLATELET
Absolute Monocytes: 308 cells/uL (ref 200–950)
Basophils Absolute: 70 cells/uL (ref 0–200)
Basophils Relative: 1.3 %
Eosinophils Absolute: 259 cells/uL (ref 15–500)
Eosinophils Relative: 4.8 %
HCT: 48.3 % — ABNORMAL HIGH (ref 35.0–45.0)
Hemoglobin: 15.6 g/dL — ABNORMAL HIGH (ref 11.7–15.5)
Lymphs Abs: 1377 cells/uL (ref 850–3900)
MCH: 29.2 pg (ref 27.0–33.0)
MCHC: 32.3 g/dL (ref 32.0–36.0)
MCV: 90.3 fL (ref 80.0–100.0)
MPV: 11.5 fL (ref 7.5–12.5)
Monocytes Relative: 5.7 %
Neutro Abs: 3386 cells/uL (ref 1500–7800)
Neutrophils Relative %: 62.7 %
Platelets: 161 10*3/uL (ref 140–400)
RBC: 5.35 10*6/uL — ABNORMAL HIGH (ref 3.80–5.10)
RDW: 12.5 % (ref 11.0–15.0)
Total Lymphocyte: 25.5 %
WBC: 5.4 10*3/uL (ref 3.8–10.8)

## 2020-04-28 LAB — LIPID PANEL
Cholesterol: 268 mg/dL — ABNORMAL HIGH (ref <200)
HDL: 107 mg/dL (ref >=50)
LDL Cholesterol (Calc): 145 mg/dL (calc) — ABNORMAL HIGH
Non-HDL Cholesterol (Calc): 161 mg/dL (calc) — ABNORMAL HIGH (ref <130)
Total CHOL/HDL Ratio: 2.5 (calc) (ref <5.0)
Triglycerides: 68 mg/dL (ref <150)

## 2020-04-28 LAB — ANA: Anti Nuclear Antibody (ANA): NEGATIVE

## 2020-04-28 LAB — CYCLIC CITRUL PEPTIDE ANTIBODY, IGG: Cyclic Citrullin Peptide Ab: <16 UNITS

## 2020-04-28 LAB — RHEUMATOID FACTOR: Rheumatoid fact SerPl-aCnc: <14 IU/mL (ref <14)

## 2020-04-28 MED ORDER — TERBINAFINE HCL 250 MG PO TABS: 250.0000 mg | ORAL_TABLET | Freq: Every day | ORAL | 0 refills | Status: DC

## 2020-05-02 ENCOUNTER — Telehealth (INDEPENDENT_AMBULATORY_CARE_PROVIDER_SITE_OTHER): Payer: Self-pay | Admitting: Gastroenterology

## 2020-05-02 ENCOUNTER — Other Ambulatory Visit: Payer: Self-pay | Admitting: Nurse Practitioner

## 2020-05-02 ENCOUNTER — Other Ambulatory Visit (INDEPENDENT_AMBULATORY_CARE_PROVIDER_SITE_OTHER): Payer: Self-pay

## 2020-05-02 DIAGNOSIS — F5104 Psychophysiologic insomnia: Secondary | ICD-10-CM

## 2020-05-02 DIAGNOSIS — Z1211 Encounter for screening for malignant neoplasm of colon: Secondary | ICD-10-CM

## 2020-05-02 DIAGNOSIS — F419 Anxiety disorder, unspecified: Secondary | ICD-10-CM

## 2020-05-02 MED ORDER — NA SULFATE-K SULFATE-MG SULF 17.5-3.13-1.6 GM/177ML PO SOLN: 1.0000 | Freq: Once | ORAL | 0 refills | Status: AC

## 2020-05-02 NOTE — Progress Notes (Signed)
Colonoscopy scheduled Friday 05/26/20 at [facility] with Dr. Lucilla Lame. COVID Test Wed 05/24/20 at [facility] arrive between 8am-1pm.  No appt required.  Instructions sent via mychart and mailed.  Thanks,  [place], [place]

## 2020-05-02 NOTE — Progress Notes (Signed)
Gastroenterology Pre-Procedure Review  Request Date: Friday 05/26/20 Requesting Physician: Dr. Allen Norris  PATIENT REVIEW QUESTIONS: The patient responded to the following health history questions as indicated:    1. Are you having any GI issues? no 2. Do you have a personal history of Polyps? no 3. Do you have a family history of Colon Cancer or Polyps? yes (maternal uncle had colon cancer, mother colon polyps) 4. Diabetes Mellitus? no 5. Joint replacements in the past 12 months?no 6. Major health problems in the past 3 months?no 7. Any artificial heart valves, MVP, or defibrillator?no    MEDICATIONS & ALLERGIES:    Patient reports the following regarding taking any anticoagulation/antiplatelet therapy:   Plavix, Coumadin, Eliquis, Xarelto, Lovenox, Pradaxa, Brilinta, or Effient? no Aspirin? yes (81 mg daily)  Patient confirms/reports the following medications:  Current Outpatient Medications  Medication Sig Dispense Refill  . aspirin 81 MG chewable tablet Chew by mouth daily.    Marland Kitchen conjugated estrogens (PREMARIN) vaginal cream Place 1 Applicatorful vaginally 3 (three) times a week. Only apply pea-sized amount to external region. (Patient not taking: Reported on 04/26/2020) 42.5 g 3  . dicyclomine (BENTYL) 10 MG capsule Take 1 capsule (10 mg total) by mouth 3 (three) times daily as needed for up to 14 days (abdominal pain). (Patient taking differently: Take 10 mg by mouth 3 (three) times daily as needed (abdominal pain). As needed) 30 capsule 0  . gabapentin (NEURONTIN) 100 MG capsule TAKE 1 CAPSULE BY MOUTH THREE TIMES A DAY (Patient taking differently: Take 100 mg by mouth 4 (four) times daily. TAKE 1 CAPSULE BY MOUTH THREE TIMES A DAY) 270 capsule 1  . hydrochlorothiazide (HYDRODIURIL) 25 MG tablet Take 1 tablet (25 mg total) by mouth daily. 90 tablet 1  . hydrOXYzine (ATARAX/VISTARIL) 10 MG tablet Take 1 tablet (10 mg total) by mouth at bedtime as needed. 30 tablet 0  . lisinopril (ZESTRIL)  10 MG tablet Take 1 tablet (10 mg total) by mouth daily. 90 tablet 1  . Magnesium 250 MG TABS Take 1 mg by mouth daily.     . meclizine (ANTIVERT) 12.5 MG tablet Take 12.5 mg by mouth in the morning, at noon, in the evening, and at bedtime.    . Na Sulfate-K Sulfate-Mg Sulf 17.5-3.13-1.6 GM/177ML SOLN Take 1 kit by mouth once for 1 dose. 354 mL 0  . NON FORMULARY     . olopatadine (PATADAY) 0.1 % ophthalmic solution 1 drop 2 (two) times daily.    Marland Kitchen omeprazole (PRILOSEC) 20 MG capsule TAKE 1 CAPSULE (20 MG TOTAL) BY MOUTH 2 (TWO) TIMES DAILY BEFORE A MEAL. 180 capsule 1  . sertraline (ZOLOFT) 100 MG tablet Take 1 tablet (100 mg total) by mouth daily. 90 tablet 1  . Specialty Vitamins Products (RETAINE VISION PO) Take by mouth.    . terbinafine (LAMISIL) 250 MG tablet Take 1 tablet (250 mg total) by mouth daily. 30 tablet 0  . traZODone (DESYREL) 50 MG tablet TAKE 1 TABLET (50 MG TOTAL) BY MOUTH AT BEDTIME AS NEEDED FOR SLEEP. 90 tablet 0  . Triamcinolone Acetonide (NASACORT ALLERGY 24HR NA) Place into the nose.    . Turmeric (QC TUMERIC COMPLEX PO) Take by mouth.     No current facility-administered medications for this visit.    Patient confirms/reports the following allergies:  Allergies  Allergen Reactions  . Versed [Midazolam] Hives and Other (See Comments)    BP increased unable to talk BP increased unable to hear  . Grapeseed  Extract [Nutritional Supplements] Hives    Grapes     No orders of the defined types were placed in this encounter.   AUTHORIZATION INFORMATION Primary Insurance: 1D#: Group #:  Secondary Insurance: 1D#: Group #:  SCHEDULE INFORMATION: Date: Friday 05/26/20 Time: Location:[facility]

## 2020-05-18 ENCOUNTER — Other Ambulatory Visit: Payer: Self-pay | Admitting: Family Medicine

## 2020-05-18 ENCOUNTER — Encounter: Payer: Self-pay | Admitting: Gastroenterology

## 2020-05-18 ENCOUNTER — Other Ambulatory Visit: Payer: Self-pay

## 2020-05-18 DIAGNOSIS — L299 Pruritus, unspecified: Secondary | ICD-10-CM

## 2020-05-18 NOTE — Telephone Encounter (Signed)
Patient has upcoming appointment 05/24/20- requesting 90 day supply of medication filled #[id number]- sent for PCP review of request

## 2020-05-24 ENCOUNTER — Other Ambulatory Visit: Payer: Self-pay

## 2020-05-24 ENCOUNTER — Other Ambulatory Visit
Admission: RE | Admit: 2020-05-24 | Discharge: 2020-05-24 | Disposition: A | Discharge status: Home / Self Care | Payer: BLUE CROSS/BLUE SHIELD | Source: Ambulatory Visit | Attending: Gastroenterology | Admitting: Gastroenterology

## 2020-05-24 ENCOUNTER — Encounter: Payer: Self-pay | Admitting: Family Medicine

## 2020-05-24 ENCOUNTER — Ambulatory Visit (INDEPENDENT_AMBULATORY_CARE_PROVIDER_SITE_OTHER): Payer: No Typology Code available for payment source | Admitting: Family Medicine

## 2020-05-24 VITALS — BP 125/75 | HR 74 | Temp 97.7°F | Ht 65.5 in | Wt 131.4 lb

## 2020-05-24 DIAGNOSIS — B351 Tinea unguium: Secondary | ICD-10-CM

## 2020-05-24 DIAGNOSIS — Z79899 Other long term (current) drug therapy: Secondary | ICD-10-CM | POA: Diagnosis not present

## 2020-05-24 DIAGNOSIS — F419 Anxiety disorder, unspecified: Secondary | ICD-10-CM

## 2020-05-24 DIAGNOSIS — Z20822 Contact with and (suspected) exposure to covid-19: Secondary | ICD-10-CM | POA: Insufficient documentation

## 2020-05-24 DIAGNOSIS — M797 Fibromyalgia: Secondary | ICD-10-CM

## 2020-05-24 DIAGNOSIS — Z01812 Encounter for preprocedural laboratory examination: Secondary | ICD-10-CM | POA: Diagnosis not present

## 2020-05-24 LAB — SARS CORONAVIRUS 2 (TAT 6-24 HRS): SARS Coronavirus 2: NEGATIVE

## 2020-05-24 NOTE — Assessment & Plan Note (Addendum)
Currently uncontrolled.  Has been taking gabapentin 142m 4x per day as needed.  Is interested in looking for a different medication that does not cause dizziness, since she has a history of persistent postural perceptual dizziness, with inner ear [facility] [place]  Has tried and failed pregabalin in the past with >60lb weight gain and swollen face.  Is comfortable on her sertraline currently.  Requesting referral to pain management for additional evaluation/options.  Plan: 1. Continue gabapentin as directed 2. Referral placed to pain management for evaluation/treatment of fibromyalgia

## 2020-05-24 NOTE — Patient Instructions (Signed)
Continue all medications as directed.  A referral to pain management, for Dr. Holley Raring, has been placed today.  If you have not heard from the specialty office or our referral coordinator within 1 week, please let us know and we will follow up with the referral coordinator for an update.  The following recommendations are helpful adjuncts for helping rebalance your mood.  Eat a nourishing diet. Ensure adequate intake of calories, protein, carbs, fat, vitamins, and minerals. Prioritize whole foods at each meal, including meats, vegetables, fruits, nuts and seeds, etc.   Avoid inflammatory and/or "junk" foods, such as sugar, omega-6 fats, refined grains, chemicals, and preservatives are common in packaged and prepared foods. Minimize or completely avoid these ingredients and stick to whole foods with little to no additives. Cook from scratch as much as possible for more control over what you eat  Get enough sleep. Poor sleep is significantly associated with depression and anxiety. Make 7-9 hours of sleep nightly a top priority  Exercise appropriately. Exercise is known to improve brain functioning and boost mood. Aim for 30 minutes of daily physical activity. Avoid "overtraining," which can cause mental disturbances  Assess your light exposure. Not enough natural light during the day and too much artificial light can have a major impact on your mood. Get outside as often as possible during daylight hours. Minimize light exposure after dark and avoid the use of electronics that give off blue light before bed  Manage your stress.  Use daily stress management techniques such as meditation, yoga, or mindfulness to retrain your brain to respond differently to stress. Try deep breathing to deactivate your "fight or flight" response.  There are many of sources with apps like Headspace, Calm or a variety of YouTube videos (videos from Gwynne Edinger have guided meditation)  Prioritize your social life. Work  on building social support with new friends or improve current relationships. Consider getting a pet that allows for companionship, social interaction, and physical touch. Try volunteering or joining a faith-based community to increase your sense of purpose  4-7-8 breathing technique at bedtime: breathe in to count of 4, hold breath for count of 7, exhale for count of 8; do 3-5 times for letting go of overactive thoughts  Take time to play Unstructured "play" time can help reduce anxiety and depression Options for play include music, games, sports, dance, art, etc.  Try to add daily omega 3 fatty acids, magnesium, B complex, and balanced amino acid supplements to help improve mood and anxiety.  We will plan to see you back in 3 months for hypertension follow up  You will receive a survey after today's visit either digitally by e-mail or paper by [place] mail. Your experiences and feedback matter to [place].  Please respond so we know how we are doing as we provide care for you.  Call us with any questions/concerns/needs.  It is my goal to be available to you for your health concerns.  Thanks for choosing me to be a partner in your healthcare needs!  Harlin Rain, FNP-C Family Nurse Practitioner [facility] Phone: [phone]

## 2020-05-24 NOTE — Assessment & Plan Note (Signed)
Reports improvement in symptoms since she has started to see a PMHNP and her counselor.  Has started some guided imagery with good results.  Will continue treatment there.

## 2020-05-24 NOTE — Progress Notes (Signed)
Subjective:    Patient ID: Denise Elliott, female    DOB: April 15, 1961, 59 y.o.   MRN: [id number]  Denise Elliott is a 59 y.o. female presenting on 05/24/2020 for Fibromyalgia (pt requesting a change in her Gabapentin, because she state that it does not control the pain. She would like to try something that doesn't cause dizziness, but help control the pain. She said she would like to discuss a possible referral to pain managment ) and Anxiety (pt currently followed by a counselor and she has an appt today for Psychiatrist. She state all her symtpoms started after she developed the inner ear imbalance. )   HPI  Ms. Kanaan presents to clinic for referral request to pain management.  Reports that she is having inadequate relief of her symptoms with gabapentin and would like to find another medication that may assist with her symptoms that does not cause dizziness.  Has taken pregabalin with >60lb weight gain and swelling to her face.  Is currently taking sertraline and has found has helped her anxiety/depression.  Denies any other acute concerns today.  Depression screen [facility] 2/9 05/24/2020 03/20/2020 07/16/2019  Decreased Interest 0 0 1  Down, Depressed, Hopeless '1 1 1  ' PHQ - 2 Score '1 1 2  ' Altered sleeping 1 0 3  Tired, decreased energy '3 3 3  ' Change in appetite '1 2 2  ' Feeling bad or failure about yourself  0 0 0  Trouble concentrating '1 2 1  ' Moving slowly or fidgety/restless 2 2 0  Suicidal thoughts 0 0 0  PHQ-9 Score '9 10 11  ' Difficult doing work/chores Somewhat difficult Somewhat difficult Somewhat difficult    Social History   Tobacco Use  . Smoking status: Never Smoker  . Smokeless tobacco: Never Used  Substance Use Topics  . Alcohol use: Not Currently    Comment: socially  . Drug use: No    Review of Systems  Constitutional: Negative.   HENT: Negative.   Respiratory: Negative.   Cardiovascular: Negative.   Endocrine: Negative.   Musculoskeletal: Positive for myalgias.  Negative for arthralgias, back pain, gait problem, joint swelling, neck pain and neck stiffness.  Skin: Negative.   Allergic/Immunologic: Negative.   Neurological: Positive for dizziness. Negative for tremors, seizures, syncope, facial asymmetry, speech difficulty, weakness, light-headedness, numbness and headaches.  Psychiatric/Behavioral: Positive for dysphoric mood. Negative for agitation, behavioral problems, confusion, decreased concentration, hallucinations, self-injury, sleep disturbance and suicidal ideas. The patient is nervous/anxious. The patient is not hyperactive.    Per HPI unless specifically indicated above     Objective:    BP 125/75 (BP Location: Left Arm, Patient Position: Sitting, Cuff Size: Normal)   Pulse 74   Temp 97.7 F (36.5 C) (Temporal)   Ht 5' 5.5" (1.664 m)   Wt 131 lb 6.4 oz (59.6 kg)   LMP 02/03/2020   BMI 21.53 kg/m   Wt Readings from Last 3 Encounters:  05/24/20 131 lb 6.4 oz (59.6 kg)  04/26/20 129 lb 3.2 oz (58.6 kg)  04/14/20 122 lb (55.3 kg)    Physical Exam Vitals reviewed.  Constitutional:      General: She is not in acute distress.    Appearance: Normal appearance. She is well-developed, well-groomed and normal weight. She is not ill-appearing or toxic-appearing.  HENT:     Head: Normocephalic and atraumatic.     Nose:     Comments: Lizbeth Bark is in place, covering mouth and nose  Eyes:  General:        Right eye: No discharge.        Left eye: No discharge.     Extraocular Movements: Extraocular movements intact.     Conjunctiva/sclera: Conjunctivae normal.     Pupils: Pupils are equal, round, and reactive to light.  Pulmonary:     Effort: Pulmonary effort is normal. No respiratory distress.  Musculoskeletal:     Right lower leg: No edema.     Left lower leg: No edema.  Skin:    General: Skin is dry.     Capillary Refill: Capillary refill takes less than 2 seconds.     Findings: No lesion or rash.  Neurological:      General: No focal deficit present.     Mental Status: She is alert and oriented to person, place, and time.     Cranial Nerves: No cranial nerve deficit.     Gait: Gait normal.  Psychiatric:        Attention and Perception: Attention and perception normal.        Mood and Affect: Mood and affect normal.        Speech: Speech normal.        Behavior: Behavior normal. Behavior is cooperative.        Thought Content: Thought content normal.        Cognition and Memory: Cognition and memory normal.        Judgment: Judgment normal.    Results for orders placed or performed in visit on 04/26/20  COMPLETE METABOLIC PANEL WITH GFR  Result Value Ref Range   Glucose, Bld 100 (H) 65 - 99 mg/dL   BUN 15 7 - 25 mg/dL   Creat 0.74 0.50 - 0.99 mg/dL   GFR, Est Non African American 88 > OR = 60 mL/min/1.79m   GFR, Est African American 102 > OR = 60 mL/min/1.760m  BUN/Creatinine Ratio NOT APPLICABLE 6 - 22 (calc)   Sodium 137 135 - 146 mmol/L   Potassium 5.4 (H) 3.5 - 5.3 mmol/L   Chloride 99 98 - 110 mmol/L   CO2 29 20 - 32 mmol/L   Calcium 9.6 8.6 - 10.4 mg/dL   Total Protein 7.1 6.1 - 8.1 g/dL   Albumin 4.6 3.6 - 5.1 g/dL   Globulin 2.5 1.9 - 3.7 g/dL (calc)   AG Ratio 1.8 1.0 - 2.5 (calc)   Total Bilirubin 0.6 0.2 - 1.2 mg/dL   Alkaline phosphatase (APISO) 65 37 - 153 U/L   AST 30 10 - 35 U/L   ALT 27 6 - 29 U/L  Rheumatoid factor  Result Value Ref Range   Rhuematoid fact SerPl-aCnc <14 <14 IU/mL  Antinuclear Antib (ANA)  Result Value Ref Range   Anti Nuclear Antibody (ANA) NEGATIVE NEGATIVE  Cyclic citrul peptide antibody, IgG  Result Value Ref Range   Cyclic Citrullin Peptide Ab <16 UNITS  CBC with Differential  Result Value Ref Range   WBC 5.4 3.8 - 10.8 Thousand/uL   RBC 5.35 (H) 3.80 - 5.10 Million/uL   Hemoglobin 15.6 (H) 11.7 - 15.5 g/dL   HCT 48.3 (H) 35.0 - 45.0 %   MCV 90.3 80.0 - 100.0 fL   MCH 29.2 27.0 - 33.0 pg   MCHC 32.3 32.0 - 36.0 g/dL   RDW 12.5 11.0 -  15.0 %   Platelets 161 140 - 400 Thousand/uL   MPV 11.5 7.5 - 12.5 fL   Neutro Abs 3,386 1,500 - 7,800  cells/uL   Lymphs Abs 1,377 850 - 3,900 cells/uL   Absolute Monocytes 308 200 - 950 cells/uL   Eosinophils Absolute 259 15 - 500 cells/uL   Basophils Absolute 70 0 - 200 cells/uL   Neutrophils Relative % 62.7 %   Total Lymphocyte 25.5 %   Monocytes Relative 5.7 %   Eosinophils Relative 4.8 %   Basophils Relative 1.3 %  Lipid panel  Result Value Ref Range   Cholesterol 268 (H) <200 mg/dL   HDL 107 > OR = 50 mg/dL   Triglycerides 68 <150 mg/dL   LDL Cholesterol (Calc) 145 (H) mg/dL (calc)   Total CHOL/HDL Ratio 2.5 <5.0 (calc)   Non-HDL Cholesterol (Calc) 161 (H) <130 mg/dL (calc)      Assessment & Plan:   Problem List Items Addressed This Visit      Musculoskeletal and Integument   Toenail fungus - Primary   Relevant Orders   COMPLETE METABOLIC PANEL WITH GFR     Other   Anxiety    Reports improvement in symptoms since she has started to see a PMHNP and her counselor.  Has started some guided imagery with good results.  Will continue treatment there.      Fibromyalgia    Currently uncontrolled.  Has been taking gabapentin 152m 4x per day as needed.  Is interested in looking for a different medication that does not cause dizziness, since she has a history of persistent postural perceptual dizziness, with inner ear [facility] [place]  Has tried and failed pregabalin in the past with >60lb weight gain and swollen face.  Is comfortable on her sertraline currently.  Requesting referral to pain management for additional evaluation/options.  Plan: 1. Continue gabapentin as directed 2. Referral placed to pain management for evaluation/treatment of fibromyalgia      Relevant Orders   Ambulatory referral to Pain Clinic    Other Visit Diagnoses    High risk medication use       Relevant Orders     COMPLETE METABOLIC PANEL WITH GFR      No orders of the defined types were placed in this encounter.     Follow up plan: Return in about 3 months (around 08/24/2020), or HTN F/U.   NHarlin Rain FBroomfieldFamily Nurse Practitioner [facility] 05/24/2020, 9:19 AM

## 2020-05-25 NOTE — Discharge Instructions (Signed)
General Anesthesia, Adult, Care After This sheet gives you information about how to care for yourself after your procedure. Your health care provider may also give you more specific instructions. If you have problems or questions, contact your health care provider. What can I expect after the procedure? After the procedure, the following side effects are common:  Pain or discomfort at the IV site.  Nausea.  Vomiting.  Sore throat.  Trouble concentrating.  Feeling cold or chills.  Weak or tired.  Sleepiness and fatigue.  Soreness and body aches. These side effects can affect parts of the body that were not involved in surgery. Follow these instructions at home:  For at least 24 hours after the procedure:  Have a responsible adult stay with you. It is important to have someone help care for you until you are awake and alert.  Rest as needed.  Do not: ? Participate in activities in which you could fall or become injured. ? Drive. ? Use heavy machinery. ? Drink alcohol. ? Take sleeping pills or medicines that cause drowsiness. ? Make important decisions or sign legal documents. ? Take care of children on your own. Eating and drinking  Follow any instructions from your health care provider about eating or drinking restrictions.  When you feel hungry, start by eating small amounts of foods that are soft and easy to digest (bland), such as toast. Gradually return to your regular diet.  Drink enough fluid to keep your urine pale yellow.  If you vomit, rehydrate by drinking water, juice, or clear broth. General instructions  If you have sleep apnea, surgery and certain medicines can increase your risk for breathing problems. Follow instructions from your health care provider about wearing your sleep device: ? Anytime you are sleeping, including during daytime naps. ? While taking prescription pain medicines, sleeping medicines, or medicines that make you drowsy.  Return to  your normal activities as told by your health care provider. Ask your health care provider what activities are safe for you.  Take over-the-counter and prescription medicines only as told by your health care provider.  If you smoke, do not smoke without supervision.  Keep all follow-up visits as told by your health care provider. This is important. Contact a health care provider if:  You have nausea or vomiting that does not get better with medicine.  You cannot eat or drink without vomiting.  You have pain that does not get better with medicine.  You are unable to pass urine.  You develop a skin rash.  You have a fever.  You have redness around your IV site that gets worse. Get help right away if:  You have difficulty breathing.  You have chest pain.  You have blood in your urine or stool, or you vomit blood. Summary  After the procedure, it is common to have a sore throat or nausea. It is also common to feel tired.  Have a responsible adult stay with you for the first 24 hours after general anesthesia. It is important to have someone help care for you until you are awake and alert.  When you feel hungry, start by eating small amounts of foods that are soft and easy to digest (bland), such as toast. Gradually return to your regular diet.  Drink enough fluid to keep your urine pale yellow.  Return to your normal activities as told by your health care provider. Ask your health care provider what activities are safe for you. This information is not   intended to replace advice given to you by your health care provider. Make sure you discuss any questions you have with your health care provider. Document Revised: 12/05/2017 Document Reviewed: 07/18/2017 Elsevier Patient Education  2020 Elsevier Inc[employer]  

## 2020-05-26 ENCOUNTER — Ambulatory Visit
Admission: RE | Admit: 2020-05-26 | Discharge: 2020-05-26 | Disposition: A | Discharge status: Home / Self Care | Payer: BLUE CROSS/BLUE SHIELD | Attending: Gastroenterology | Admitting: Gastroenterology

## 2020-05-26 ENCOUNTER — Ambulatory Visit: Payer: BLUE CROSS/BLUE SHIELD | Admitting: Anesthesiology

## 2020-05-26 ENCOUNTER — Encounter: Payer: Self-pay | Admitting: Gastroenterology

## 2020-05-26 ENCOUNTER — Other Ambulatory Visit: Payer: Self-pay

## 2020-05-26 ENCOUNTER — Encounter
Admission: RE | Disposition: A | Discharge status: Home / Self Care | Payer: Self-pay | Source: Home / Self Care | Attending: Gastroenterology

## 2020-05-26 DIAGNOSIS — K219 Gastro-esophageal reflux disease without esophagitis: Secondary | ICD-10-CM | POA: Insufficient documentation

## 2020-05-26 DIAGNOSIS — Z7982 Long term (current) use of aspirin: Secondary | ICD-10-CM | POA: Insufficient documentation

## 2020-05-26 DIAGNOSIS — E785 Hyperlipidemia, unspecified: Secondary | ICD-10-CM | POA: Insufficient documentation

## 2020-05-26 DIAGNOSIS — Z8249 Family history of ischemic heart disease and other diseases of the circulatory system: Secondary | ICD-10-CM | POA: Diagnosis not present

## 2020-05-26 DIAGNOSIS — M797 Fibromyalgia: Secondary | ICD-10-CM | POA: Insufficient documentation

## 2020-05-26 DIAGNOSIS — Z8371 Family history of colonic polyps: Secondary | ICD-10-CM | POA: Diagnosis not present

## 2020-05-26 DIAGNOSIS — G4730 Sleep apnea, unspecified: Secondary | ICD-10-CM | POA: Insufficient documentation

## 2020-05-26 DIAGNOSIS — G709 Myoneural disorder, unspecified: Secondary | ICD-10-CM | POA: Diagnosis not present

## 2020-05-26 DIAGNOSIS — H409 Unspecified glaucoma: Secondary | ICD-10-CM | POA: Diagnosis not present

## 2020-05-26 DIAGNOSIS — F329 Major depressive disorder, single episode, unspecified: Secondary | ICD-10-CM | POA: Diagnosis not present

## 2020-05-26 DIAGNOSIS — F419 Anxiety disorder, unspecified: Secondary | ICD-10-CM | POA: Insufficient documentation

## 2020-05-26 DIAGNOSIS — H9319 Tinnitus, unspecified ear: Secondary | ICD-10-CM | POA: Diagnosis not present

## 2020-05-26 DIAGNOSIS — Z9049 Acquired absence of other specified parts of digestive tract: Secondary | ICD-10-CM | POA: Insufficient documentation

## 2020-05-26 DIAGNOSIS — Z79899 Other long term (current) drug therapy: Secondary | ICD-10-CM | POA: Diagnosis not present

## 2020-05-26 DIAGNOSIS — Z888 Allergy status to other drugs, medicaments and biological substances status: Secondary | ICD-10-CM | POA: Diagnosis not present

## 2020-05-26 DIAGNOSIS — Z8349 Family history of other endocrine, nutritional and metabolic diseases: Secondary | ICD-10-CM | POA: Insufficient documentation

## 2020-05-26 DIAGNOSIS — Z91018 Allergy to other foods: Secondary | ICD-10-CM | POA: Insufficient documentation

## 2020-05-26 DIAGNOSIS — M1990 Unspecified osteoarthritis, unspecified site: Secondary | ICD-10-CM | POA: Diagnosis not present

## 2020-05-26 DIAGNOSIS — Z800 Family history of malignant neoplasm of digestive organs: Secondary | ICD-10-CM | POA: Insufficient documentation

## 2020-05-26 DIAGNOSIS — D124 Benign neoplasm of descending colon: Secondary | ICD-10-CM | POA: Diagnosis not present

## 2020-05-26 DIAGNOSIS — R42 Dizziness and giddiness: Secondary | ICD-10-CM | POA: Insufficient documentation

## 2020-05-26 DIAGNOSIS — Z1211 Encounter for screening for malignant neoplasm of colon: Secondary | ICD-10-CM | POA: Diagnosis present

## 2020-05-26 DIAGNOSIS — I10 Essential (primary) hypertension: Secondary | ICD-10-CM | POA: Insufficient documentation

## 2020-05-26 DIAGNOSIS — K635 Polyp of colon: Secondary | ICD-10-CM

## 2020-05-26 DIAGNOSIS — Z87442 Personal history of urinary calculi: Secondary | ICD-10-CM | POA: Diagnosis not present

## 2020-05-26 DIAGNOSIS — R519 Headache, unspecified: Secondary | ICD-10-CM | POA: Diagnosis not present

## 2020-05-26 DIAGNOSIS — Z811 Family history of alcohol abuse and dependence: Secondary | ICD-10-CM | POA: Insufficient documentation

## 2020-05-26 DIAGNOSIS — K641 Second degree hemorrhoids: Secondary | ICD-10-CM | POA: Diagnosis not present

## 2020-05-26 HISTORY — DX: Other complications of anesthesia, initial encounter: T88.59XA

## 2020-05-26 HISTORY — DX: Headache, unspecified: R51.9

## 2020-05-26 HISTORY — PX: COLONOSCOPY WITH PROPOFOL: SHX5780

## 2020-05-26 HISTORY — DX: Dizziness and giddiness: R42

## 2020-05-26 HISTORY — DX: Varicose veins of bilateral lower extremities with other complications: I83.893

## 2020-05-26 HISTORY — DX: Personal history of urinary calculi: Z87.442

## 2020-05-26 HISTORY — DX: Tinnitus, unspecified ear: H93.19

## 2020-05-26 HISTORY — PX: POLYPECTOMY: SHX5525

## 2020-05-26 SURGERY — COLONOSCOPY WITH PROPOFOL
Anesthesia: General

## 2020-05-26 MED ORDER — STERILE WATER FOR IRRIGATION IR SOLN: Status: DC | PRN

## 2020-05-26 MED ORDER — LIDOCAINE HCL (CARDIAC) PF 100 MG/5ML IV SOSY
PREFILLED_SYRINGE | INTRAVENOUS | Status: DC | PRN
  Administered 2020-05-26: 30 mg via INTRAVENOUS

## 2020-05-26 MED ORDER — PROPOFOL 10 MG/ML IV BOLUS
INTRAVENOUS | Status: DC | PRN
  Administered 2020-05-26 (×3): 30 mg via INTRAVENOUS
  Administered 2020-05-26: 140 mg via INTRAVENOUS
  Administered 2020-05-26 (×4): 30 mg via INTRAVENOUS

## 2020-05-26 MED ORDER — LACTATED RINGERS IV SOLN
10.0000 mL/h | INTRAVENOUS | Status: DC
  Administered 2020-05-26: 10 mL/h via INTRAVENOUS

## 2020-05-26 SURGICAL SUPPLY — 24 items
CLIP HMST 235XBRD CATH ROT (MISCELLANEOUS) IMPLANT
CLIP RESOLUTION 360 11X235 (MISCELLANEOUS)
ELECT REM PT RETURN 9FT ADLT (ELECTROSURGICAL)
ELECTRODE REM PT RTRN 9FT ADLT (ELECTROSURGICAL) IMPLANT
FCP ESCP3.2XJMB 240X2.8X (MISCELLANEOUS)
FORCEPS BIOP RAD 4 LRG CAP 4 (CUTTING FORCEPS) IMPLANT
FORCEPS BIOP RJ4 240 W/NDL (MISCELLANEOUS)
FORCEPS ESCP3.2XJMB 240X2.8X (MISCELLANEOUS) IMPLANT
GOWN CVR UNV OPN BCK APRN NK (MISCELLANEOUS) ×4 IMPLANT
GOWN ISOL THUMB LOOP REG UNIV (MISCELLANEOUS) ×4
INJECTOR VARIJECT VIN23 (MISCELLANEOUS) IMPLANT
KIT DEFENDO VALVE AND CONN (KITS) IMPLANT
KIT ENDO PROCEDURE OLY (KITS) ×4 IMPLANT
MANIFOLD NEPTUNE II (INSTRUMENTS) IMPLANT
MARKER SPOT ENDO TATTOO 5ML (MISCELLANEOUS) IMPLANT
PROBE APC STR FIRE (PROBE) IMPLANT
RETRIEVER NET ROTH 2.5X230 LF (MISCELLANEOUS) IMPLANT
SNARE SHORT THROW 13M SML OVAL (MISCELLANEOUS) IMPLANT
SNARE SHORT THROW 30M LRG OVAL (MISCELLANEOUS) IMPLANT
SNARE SNG USE RND 15MM (INSTRUMENTS) ×4 IMPLANT
SPOT EX ENDOSCOPIC TATTOO (MISCELLANEOUS)
TRAP ETRAP POLY (MISCELLANEOUS) ×4 IMPLANT
VARIJECT INJECTOR VIN23 (MISCELLANEOUS)
WATER STERILE IRR 250ML POUR (IV SOLUTION) ×4 IMPLANT

## 2020-05-26 NOTE — Anesthesia Preprocedure Evaluation (Signed)
Anesthesia Evaluation  Patient identified by MRN, date of birth, ID band Patient awake    Reviewed: Allergy & Precautions, H&P , NPO status , Patient's Chart, lab work & pertinent test results  Airway Mallampati: II  TM Distance: >3 FB Neck ROM: full    Dental no notable dental hx.    Pulmonary    Pulmonary exam normal breath sounds clear to auscultation       Cardiovascular hypertension, Normal cardiovascular exam Rhythm:regular Rate:Normal     Neuro/Psych PSYCHIATRIC DISORDERS  Neuromuscular disease    GI/Hepatic GERD  ,  Endo/Other    Renal/GU      Musculoskeletal   Abdominal   Peds  Hematology   Anesthesia Other Findings   Reproductive/Obstetrics                             Anesthesia Physical Anesthesia Plan  ASA: II  Anesthesia Plan: General   Post-op Pain Management:    Induction: Intravenous  PONV Risk Score and Plan: 3 and Treatment may vary due to age or medical condition, TIVA and Propofol infusion  Airway Management Planned: Natural Airway  Additional Equipment:   Intra-op Plan:   Post-operative Plan:   Informed Consent: I have reviewed the patients History and Physical, chart, labs and discussed the procedure including the risks, benefits and alternatives for the proposed anesthesia with the patient or authorized representative who has indicated his/her understanding and acceptance.     Dental Advisory Given  Plan Discussed with: CRNA  Anesthesia Plan Comments:         Anesthesia Quick Evaluation

## 2020-05-26 NOTE — Transfer of Care (Signed)
Immediate Anesthesia Transfer of Care Note  Patient: Denise Elliott  Procedure(s) Performed: COLONOSCOPY WITH PROPOFOL (N/A ) POLYPECTOMY  Patient Location: PACU  Anesthesia Type: General  Level of Consciousness: awake, alert  and patient cooperative  Airway and Oxygen Therapy: Patient Spontanous Breathing and Patient connected to supplemental oxygen  Post-op Assessment: Post-op Vital signs reviewed, Patient's Cardiovascular Status Stable, Respiratory Function Stable, Patent Airway and No signs of Nausea or vomiting  Post-op Vital Signs: Reviewed and stable  Complications: No complications documented.

## 2020-05-26 NOTE — H&P (Signed)
Denise Lame, MD [facility] [address], [place], [place] [address] Phone: [phone] Fax : [phone]  Primary Care Physician:  Verl Bangs, FNP Primary Gastroenterologist:  Dr. Allen Norris  Pre-Procedure History & Physical: HPI:  Denise Elliott is a 59 y.o. female is here for a screening colonoscopy.   Past Medical History:  Diagnosis Date  . Allergy   . Anxiety   . Arthritis    in neck, hands  . Cervical dystonia    bone spurs in neck  . Complication of anesthesia    versed allergy  . Depression   . Fibromyalgia    muscle pain  . Fibromyalgia   . GERD (gastroesophageal reflux disease)    Hx of, no issues at this point  . Glaucoma    closed angle glaucoma both eyes, laser treatment in past  . Goiter    thyroid  . Headache    sinus headaches, inner ear imbalance, light sensitivity  . History of kidney stones   . HLD (hyperlipidemia)    statin intolerance  . Hypertension    controlled on meds  . 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 Plantar fasciitis   . Sleep apnea    used CPAP, lost 60 pounds no issue now  . Tinnitus    wears hearing aids  . Varicose veins of both legs with edema   . Vertigo    inner ear imbalance    Past Surgical History:  Procedure Laterality Date  . CHOLECYSTECTOMY    . COSMETIC SURGERY Left    broken cheek bone  . GALLBLADDER SURGERY    . TONSILLECTOMY    . TONSILLECTOMY      Prior to Admission medications   Medication Sig Start Date End Date Taking? Authorizing Provider  carboxymethylcellulose (REFRESH PLUS) 0.5 % SOLN 1 drop 3 (three) times daily as needed.   Yes [provider]  gabapentin (NEURONTIN) 100 MG capsule TAKE 1 CAPSULE BY MOUTH THREE TIMES A DAY Patient taking differently: Take 100 mg by mouth 4 (four) times daily. TAKE 1 CAPSULE BY MOUTH THREE TIMES A DAY 01/06/20  Yes Karamalegos, Devonne Doughty, DO  hydrochlorothiazide (HYDRODIURIL) 25 MG tablet Take 1 tablet  (25 mg total) by mouth daily. 03/27/20  Yes Malfi, Lupita Raider, FNP  hydrOXYzine (ATARAX/VISTARIL) 10 MG tablet Take 1 tablet (10 mg total) by mouth at bedtime as needed. 05/18/20  Yes Malfi, Lupita Raider, FNP  lisinopril (ZESTRIL) 10 MG tablet Take 1 tablet (10 mg total) by mouth daily. 03/27/20  Yes Malfi, Lupita Raider, FNP  Magnesium 250 MG TABS Take 1 mg by mouth daily.    Yes [provider]  meclizine (ANTIVERT) 12.5 MG tablet Take 12.5 mg by mouth in the morning, at noon, in the evening, and at bedtime.   Yes [provider]  NON FORMULARY 4 capsules 2 (two) times daily.    Yes [provider]  olopatadine (PATADAY) 0.1 % ophthalmic solution 1 drop 2 (two) times daily.   Yes [provider]  sertraline (ZOLOFT) 100 MG tablet Take 1 tablet (100 mg total) by mouth daily. Patient taking differently: Take 50 mg by mouth daily.  01/06/20  Yes Olin Hauser, DO  Specialty Vitamins Products (RETAINE VISION PO) Take by mouth.   Yes [provider]  [place] KIT 17.5-3.13-1.6 GM/177ML SOLN SMARTSIG:354 Milliliter(s) By Mouth As Directed 05/17/20  Yes [provider]  terbinafine (LAMISIL) 250 MG  tablet Take 1 tablet (250 mg total) by mouth daily. 04/28/20  Yes Malfi, Lupita Raider, FNP  traZODone (DESYREL) 50 MG tablet TAKE 1 TABLET (50 MG TOTAL) BY MOUTH AT BEDTIME AS NEEDED FOR SLEEP. 05/02/20  Yes Malfi, Lupita Raider, FNP  Triamcinolone Acetonide (NASACORT ALLERGY 24HR NA) Place into the nose.   Yes [provider]  Turmeric (QC TUMERIC COMPLEX PO) Take by mouth.   Yes [provider]  aspirin 81 MG chewable tablet Chew by mouth daily.    [provider]  triamcinolone acetonide (TRIESENCE) 40 MG/ML SUSP Inject into the articular space. 05/23/20   [provider]    Allergies as of 05/02/2020 - Review Complete 05/02/2020  Allergen Reaction Noted  . Versed [midazolam] Hives and Other (See Comments) 07/13/2015  .  Grapeseed extract [nutritional supplements] Hives 02/22/2020    Family History  Problem Relation Age of Onset  . Colon polyps Mother   . Hypertension Mother   . Goiter Mother   . AAA (abdominal aortic aneurysm) Father   . Alcohol abuse Father   . Healthy Sister   . Healthy Daughter   . Healthy Son   . Colon cancer Maternal Uncle   . Vision loss Maternal Grandmother   . Liver cancer Paternal Grandfather   . Alcohol abuse Paternal Grandfather   . Healthy Sister   . Healthy Sister   . Healthy Son   . Bladder Cancer Neg Hx   . Kidney cancer Neg Hx   . Prostate cancer Neg Hx   . Breast cancer Neg Hx     Social History   Socioeconomic History  . Marital status: Married    Spouse name: Not on file  . Number of children: Not on file  . Years of education: Not on file  . Highest education level: Not on file  Occupational History  . Not on file  Tobacco Use  . Smoking status: Never Smoker  . Smokeless tobacco: Never Used  Vaping Use  . Vaping Use: Never used  Substance and Sexual Activity  . Alcohol use: Not Currently    Comment: socially  . Drug use: No  . Sexual activity: Yes    Birth control/protection: Post-menopausal  Other Topics Concern  . Not on file  Social History Narrative  . Not on file   Social Determinants of Health   Financial Resource Strain:   . Difficulty of Paying Living Expenses:   Food Insecurity:   . Worried About Charity fundraiser in the Last Year:   . Arboriculturist in the Last Year:   Transportation Needs:   . Film/video editor (Medical):   Marland Kitchen Lack of Transportation (Non-Medical):   Physical Activity:   . Days of Exercise per Week:   . Minutes of Exercise per Session:   Stress:   . Feeling of Stress :   Social Connections:   . Frequency of Communication with Friends and Family:   . Frequency of Social Gatherings with Friends and Family:   . Attends Religious Services:   . Active Member of Clubs or Organizations:   . Attends  Archivist Meetings:   Marland Kitchen Marital Status:   Intimate Partner Violence:   . Fear of Current or Ex-Partner:   . Emotionally Abused:   Marland Kitchen Physically Abused:   . Sexually Abused:     Review of Systems: See HPI, otherwise negative ROS  Physical Exam: BP 137/74   Pulse 64   Temp Marland Kitchen)  97.5 F (36.4 C) (Temporal)   Resp 16   Ht 5' 5.5" (1.664 m)   Wt 57.6 kg   LMP 02/03/2020   SpO2 99%   BMI 20.81 kg/m  General:   Alert,  pleasant and cooperative in NAD Head:  Normocephalic and atraumatic. Neck:  Supple; no masses or thyromegaly. Lungs:  Clear throughout to auscultation.    Heart:  Regular rate and rhythm. Abdomen:  Soft, nontender and nondistended. Normal bowel sounds, without guarding, and without rebound.   Neurologic:  Alert and  oriented x4;  grossly normal neurologically.  Impression/Plan: Denise Elliott is now here to undergo a screening colonoscopy.  Risks, benefits, and alternatives regarding colonoscopy have been reviewed with the patient.  Questions have been answered.  All parties agreeable.

## 2020-05-26 NOTE — Anesthesia Procedure Notes (Signed)
Date/Time: 05/26/2020 10:59 AM Performed by: Cameron Ali, CRNA Pre-anesthesia Checklist: Patient identified, Emergency Drugs available, Suction available, Timeout performed and Patient being monitored Patient Re-evaluated:Patient Re-evaluated prior to induction Oxygen Delivery Method: Nasal cannula Placement Confirmation: positive ETCO2

## 2020-05-26 NOTE — Op Note (Signed)
[facility]astroenter[facility] Patient Name: Denise Elliott Procedure Date: 05/26/2020 10:50 AM MRN: [id number] Account #: [id number] Date of Birth: [birth year] Admit Type: Outpatient Age: 59 Room: [facility] OR ROOM 01 Gender: Female Note Status: Finalized Procedure:             Colonoscopy Indications:           Screening for colorectal malignant neoplasm Providers:             Lucilla Lame MD, MD Referring MD:          Lupita Raider. Malfi (Referring MD) Medicines:             Propofol per Anesthesia Complications:         No immediate complications. Procedure:             Pre-Anesthesia Assessment:                        - Prior to the procedure, a History and Physical was                         performed, and patient medications and allergies were                         reviewed. The patient's tolerance of previous                         anesthesia was also reviewed. The risks and benefits                         of the procedure and the sedation options and risks                         were discussed with the patient. All questions were                         answered, and informed consent was obtained. Prior                         Anticoagulants: The patient has taken no previous                         anticoagulant or antiplatelet agents. ASA Grade                         Assessment: II - A patient with mild systemic disease.                         After reviewing the risks and benefits, the patient                         was deemed in satisfactory condition to undergo the                         procedure.                        After obtaining informed consent, the colonoscope was  passed under direct vision. Throughout the procedure,                         the patient's blood pressure, pulse, and oxygen                         saturations were monitored continuously. The                         Colonoscope was introduced through the  anus and                         advanced to the the cecum, identified by appendiceal                         orifice and ileocecal valve. The colonoscopy was                         performed without difficulty. The patient tolerated                         the procedure well. The quality of the bowel                         preparation was excellent. Findings:      The perianal and digital rectal examinations were normal.      A 5 mm polyp was found in the descending colon. The polyp was sessile.       The polyp was removed with a cold snare. Resection and retrieval were       complete.      Non-bleeding internal hemorrhoids were found during retroflexion. The       hemorrhoids were Grade II (internal hemorrhoids that prolapse but reduce       spontaneously). Impression:            - One 5 mm polyp in the descending colon, removed with                         a cold snare. Resected and retrieved.                        - Non-bleeding internal hemorrhoids. Recommendation:        - Discharge patient to home.                        - Resume previous diet.                        - Continue present medications.                        - Await pathology results.                        - Repeat colonoscopy in 5 years if polyp adenoma and                         10 years if hyperplastic Procedure Code(s):     --- Professional ---  45385, Colonoscopy, flexible; with removal of                         tumor(s), polyp(s), or other lesion(s) by snare                         technique Diagnosis Code(s):     --- Professional ---                        Z12.11, Encounter for screening for malignant neoplasm                         of colon                        K63.5, Polyp of colon CPT copyright 2019 American Medical Association. All rights reserved. The codes documented in this report are preliminary and upon coder review may  be revised to meet current compliance  requirements. Lucilla Lame MD, MD 05/26/2020 11:19:24 AM This report has been signed electronically. Number of Addenda: 0 Note Initiated On: 05/26/2020 10:50 AM Scope Withdrawal Time: 0 hours 9 minutes 48 seconds  Total Procedure Duration: 0 hours 15 minutes 40 seconds  Estimated Blood Loss:  Estimated blood loss: none.      [facility]

## 2020-05-26 NOTE — Anesthesia Postprocedure Evaluation (Signed)
Anesthesia Post Note  Patient: Denise Elliott  Procedure(s) Performed: COLONOSCOPY WITH PROPOFOL (N/A ) POLYPECTOMY     Patient location during evaluation: PACU Anesthesia Type: General Level of consciousness: awake and alert and oriented Pain management: satisfactory to patient Vital Signs Assessment: post-procedure vital signs reviewed and stable Respiratory status: spontaneous breathing, nonlabored ventilation and respiratory function stable Cardiovascular status: blood pressure returned to baseline and stable Postop Assessment: Adequate PO intake and No signs of nausea or vomiting Anesthetic complications: no   No complications documented.  Raliegh Ip

## 2020-05-29 ENCOUNTER — Encounter: Payer: Self-pay | Admitting: Gastroenterology

## 2020-05-30 LAB — SURGICAL PATHOLOGY

## 2020-05-31 ENCOUNTER — Encounter: Payer: Self-pay | Admitting: Gastroenterology

## 2020-06-16 ENCOUNTER — Other Ambulatory Visit: Payer: Self-pay | Admitting: Family Medicine

## 2020-06-16 ENCOUNTER — Telehealth: Payer: Self-pay | Admitting: Family Medicine

## 2020-06-16 DIAGNOSIS — I10 Essential (primary) hypertension: Secondary | ICD-10-CM

## 2020-06-16 MED ORDER — HYDROCHLOROTHIAZIDE 12.5 MG PO TABS: 12.5000 mg | ORAL_TABLET | Freq: Every day | ORAL | 1 refills | Status: DC

## 2020-06-16 NOTE — Telephone Encounter (Signed)
Refill sent to pharmacy.  BP medication can cause her dizziness, but she has been on these medications for a while and wouldn't expect a new onset dizziness.  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

## 2020-06-16 NOTE — Telephone Encounter (Signed)
Pt has called the pharm and she is not due for refill on hctz 12.5 mg until aug per pharm. Pt think she must of misplace a bottle and needs a refill. Pt last seen nicole on 05-24-2020. [facility] [address]

## 2020-06-16 NOTE — Telephone Encounter (Signed)
Phone call to pharmacy to verify when pt. Can get refill on HCTZ.  Refill was sent in to pharmacy on 03/27/20 for HCTZ 25 mg./ #90/ RF x 1.  Was advised by Pharmacy Tech that pt. Picked up last refill on 04/24/20, #90, so would not be due for refill until 07/25/20.  Phone call to pt.  Advised of information obtained from Pharmacy, re: refill date of HCTZ.  Pt. Stated she has misplaced this and needs a refill for "Hydrochlorothiazide 12.5 mg."  Noted that there was a discrepancy in the dosage.  Upon chart review, Telemedicine note of 03/27/20, HCTZ 12.5 mg. qd was listed on medication list, and had a note that "pt. was taking differently: take 25 mg daily."   Questioned pt. On specific dose she is now taking.  Pt. Stated she has been on 12.5 mg for awhile. Will send note to PCP to review dosage and make recommendations.  Pt. Verb. Understanding.   Pt. also stated she has intermittent dizziness from inner ear imbalance, and questioned if the BP medication could also cause dizziness.  Questioned if she needs to be on both HCTZ and Lisinopril, if those meds could contribute to her dizziness too?

## 2020-06-20 NOTE — Telephone Encounter (Signed)
The pt was notified, no questions or concerns. 

## 2020-07-03 ENCOUNTER — Telehealth: Payer: Self-pay | Admitting: Family Medicine

## 2020-07-03 NOTE — Telephone Encounter (Signed)
Patient would like the nurse or doctor to call her regarding a referral that was put in.  She stated that it has been a few weeks and she still has not heard anything yet.  Please call to discuss at [phone]

## 2020-07-03 NOTE — Telephone Encounter (Signed)
I called the patient and left a very detail message on the patient vm concerning her referrals. [facility] attempted to contact the patient and left a vm. She just need to give them a call back.   [facility] can sometime take awhile to hear back from them. I left the office phone number on the patient vm to call to see if she can go ahead and schedule the appt on her own since the referral has already been placed.

## 2020-07-12 ENCOUNTER — Telehealth: Payer: Self-pay | Admitting: *Deleted

## 2020-07-12 NOTE — Telephone Encounter (Signed)
Called patient to remind her of tomorrow's appt. Message left, instructed her to complete the New Patient form and bring it.

## 2020-07-13 ENCOUNTER — Encounter: Payer: Self-pay | Admitting: Student in an Organized Health Care Education/Training Program

## 2020-07-13 ENCOUNTER — Ambulatory Visit
Payer: BLUE CROSS/BLUE SHIELD | Attending: Student in an Organized Health Care Education/Training Program | Admitting: Student in an Organized Health Care Education/Training Program

## 2020-07-13 ENCOUNTER — Other Ambulatory Visit: Payer: Self-pay

## 2020-07-13 VITALS — BP 119/80 | HR 82 | Temp 97.8°F | Ht 66.0 in | Wt 139.0 lb

## 2020-07-13 DIAGNOSIS — G894 Chronic pain syndrome: Secondary | ICD-10-CM | POA: Insufficient documentation

## 2020-07-13 DIAGNOSIS — M791 Myalgia, unspecified site: Secondary | ICD-10-CM | POA: Diagnosis present

## 2020-07-13 DIAGNOSIS — M549 Dorsalgia, unspecified: Secondary | ICD-10-CM | POA: Diagnosis present

## 2020-07-13 DIAGNOSIS — M797 Fibromyalgia: Secondary | ICD-10-CM | POA: Diagnosis not present

## 2020-07-13 DIAGNOSIS — G243 Spasmodic torticollis: Secondary | ICD-10-CM | POA: Insufficient documentation

## 2020-07-13 DIAGNOSIS — F419 Anxiety disorder, unspecified: Secondary | ICD-10-CM

## 2020-07-13 MED ORDER — DULOXETINE HCL 30 MG PO CPEP: ORAL_CAPSULE | ORAL | 0 refills | Status: DC

## 2020-07-13 NOTE — Progress Notes (Signed)
Safety precautions to be maintained throughout the outpatient stay will include: orient to surroundings, keep bed in low position, maintain call bell within reach at all times, provide assistance with transfer out of bed and ambulation.  
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
## 2020-07-13 NOTE — Patient Instructions (Signed)
1. Decrease Zoloft to 25 mg for the next 1 to 2 weeks and then discontinue 2. Start Cymbalta at 30 mg for the next week and then increase to 60 mg if no side effects. 3. I will see back in 4 weeks.

## 2020-08-01 ENCOUNTER — Ambulatory Visit: Payer: 59 | Admitting: Student in an Organized Health Care Education/Training Program

## 2020-08-08 ENCOUNTER — Other Ambulatory Visit: Payer: Self-pay | Admitting: Family Medicine

## 2020-08-08 DIAGNOSIS — F419 Anxiety disorder, unspecified: Secondary | ICD-10-CM

## 2020-08-08 DIAGNOSIS — F5104 Psychophysiologic insomnia: Secondary | ICD-10-CM

## 2020-08-13 ENCOUNTER — Other Ambulatory Visit: Payer: Self-pay | Admitting: Student in an Organized Health Care Education/Training Program

## 2020-08-14 ENCOUNTER — Telehealth: Payer: Self-pay

## 2020-08-14 NOTE — Telephone Encounter (Signed)
Copied from [place] [id number]. Topic: General - Other >> Aug 14, 2020  3:46 PM Keene Breath wrote: Reason for CRM: Patient called to ask the nurse or doctor how she should be treating her symptoms after testing positive for covid.  Please advise and call patient to let her know at [phone]  Virtual appt scheduled for tomorrow at 8:00am to discuss treatment recommendation for new diagnoses of COVID.

## 2020-08-15 ENCOUNTER — Encounter: Payer: Self-pay | Admitting: Family Medicine

## 2020-08-15 ENCOUNTER — Other Ambulatory Visit: Payer: Self-pay

## 2020-08-15 ENCOUNTER — Telehealth (INDEPENDENT_AMBULATORY_CARE_PROVIDER_SITE_OTHER): Payer: No Typology Code available for payment source | Admitting: Family Medicine

## 2020-08-15 VITALS — Temp 99.4°F

## 2020-08-15 DIAGNOSIS — R110 Nausea: Secondary | ICD-10-CM | POA: Diagnosis not present

## 2020-08-15 DIAGNOSIS — U071 COVID-19: Secondary | ICD-10-CM | POA: Diagnosis not present

## 2020-08-15 MED ORDER — ONDANSETRON 4 MG PO TBDP: 4.0000 mg | ORAL_TABLET | Freq: Three times a day (TID) | ORAL | 0 refills | Status: DC | PRN

## 2020-08-15 NOTE — Patient Instructions (Signed)
If you begin to have worsening shortness of breath, chest pain, fever over 104 that is not responsive to ibuprofen and/or acetaminophen, or impending sense of doom to [place]!  Increase your fluid and food intake.   Can take ondansetron ODT 4mg  every 8 hours to help with nausea.  Continue in quarantine until you are 10 days from symptom onset, have been fever free for 72 hours with an improvement in your symptoms  We will plan to see you back if your symptoms worsen or fail to improve  You will receive a survey after today's visit either digitally by e-mail or paper by [employer]. Your experiences and feedback matter to [place].  Please respond so we know how we are doing as we provide care for you.  Call us with any questions/concerns/needs.  It is my goal to be available to you for your health concerns.  Thanks for choosing me to be a partner in your healthcare needs!  Harlin Rain, FNP-C Family Nurse Practitioner [facility] Phone: [phone]

## 2020-08-15 NOTE — Assessment & Plan Note (Signed)
COVID-19 testing positive on 08/14/2020 with 2 home testing kits.  Symptomatic with diarrhea, nausea, cough, body aches and fever.  Fever and body aches responsive to ibuprofen.  Has been eating crackers for her diarrhea with some relief.  Has started vitamin C, zinc and vitamin D3.  Plan: 1. Can continue OTC vitamin C, zinc and vitamin D3.  Discussed adding in elderberry for immune support 2. Increase fluids and protein intake 3. Strict ER precautions 4. RTC PRN

## 2020-08-15 NOTE — Progress Notes (Signed)
Virtual Visit via Telephone  The purpose of this virtual visit is to provide medical care while limiting exposure to the novel coronavirus (COVID19) for both patient and office staff.  Consent was obtained for phone visit:  Yes.   Answered questions that patient had about telehealth interaction:  Yes.   I discussed the limitations, risks, security and privacy concerns of performing an evaluation and management service by telephone. I also discussed with the patient that there may be a patient responsible charge related to this service. The patient expressed understanding and agreed to proceed.  Patient is at home and is accessed via telephone Services are provided by Harlin Rain, FNP-C from [facility])  ---------------------------------------------------------------------- Chief Complaint  Patient presents with  . Covid Exposure    Pt was tested positive for COVID w/ the home kits x 2 days ago. Coughing, bodyaches, diarrhea, headaches , nauseated and fever. Her symptoms started Thursday w/ a cough and col like symptoms.      S: Reviewed CMA documentation. I have called patient and gathered additional HPI as follows:  Denise Elliott presents for telemedicine visit for concerns of testing positive for COVID yesterday with 2 home COVID tests.  Reports she has been having nausea, diarrhea, cough, body aches, headaches and fever.  Has started to take ibuprofen with some relief of fever and body aches.  Has started to take vitamin C, zinc and vitamin D3.  Denies productive cough, is able to lay flat, cough is tolerable with cough drops, not interfering with sleep.  Is able to tolerate oral intake of food and fluids.  Patient is currently home in quarantine Denies any high risk travel to areas of current concern for COVID19. Denies any known or suspected exposure to person with or possibly with COVID19.  Past Medical History:  Diagnosis Date  . Allergy   . Anxiety    . Arthritis    in neck, hands  . Cervical dystonia    bone spurs in neck  . Complication of anesthesia    versed allergy  . Depression   . Fibromyalgia    muscle pain  . Fibromyalgia   . GERD (gastroesophageal reflux disease)    Hx of, no issues at this point  . Glaucoma    closed angle glaucoma both eyes, laser treatment in past  . Goiter    thyroid  . Headache    sinus headaches, inner ear imbalance, light sensitivity  . History of kidney stones   . HLD (hyperlipidemia)    statin intolerance  . Hypertension    controlled on meds  . 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 Plantar fasciitis   . Sleep apnea    used CPAP, lost 60 pounds no issue now  . Tinnitus    wears hearing aids  . Varicose veins of both legs with edema   . Vertigo    inner ear imbalance   Social History   Tobacco Use  . Smoking status: Never Smoker  . Smokeless tobacco: Never Used  Vaping Use  . Vaping Use: Never used  Substance Use Topics  . Alcohol use: Not Currently    Comment: socially  . Drug use: No    Current Outpatient Medications:  .  acetaminophen (TYLENOL) 500 MG tablet, Take 1,000 mg by mouth every 4 (four) hours as needed., Disp: , Rfl:  .  DULoxetine (CYMBALTA) 30 MG capsule, 30 mg for 1 week, then increase  to 60 mg daily., Disp: 75 capsule, Rfl: 0 .  hydrochlorothiazide (HYDRODIURIL) 12.5 MG tablet, Take 1 tablet (12.5 mg total) by mouth daily., Disp: 90 tablet, Rfl: 1 .  lisinopril (ZESTRIL) 10 MG tablet, Take 1 tablet (10 mg total) by mouth daily., Disp: 90 tablet, Rfl: 1 .  meclizine (ANTIVERT) 12.5 MG tablet, Take 12.5 mg by mouth in the morning, at noon, in the evening, and at bedtime., Disp: , Rfl:  .  aspirin 81 MG chewable tablet, Chew by mouth daily. (Patient not taking: Reported on 08/15/2020), Disp: , Rfl:  .  carboxymethylcellulose (REFRESH PLUS) 0.5 % SOLN, 1 drop 3 (three) times daily as needed. (Patient not taking: Reported  on 08/15/2020), Disp: , Rfl:  .  gabapentin (NEURONTIN) 100 MG capsule, TAKE 1 CAPSULE BY MOUTH THREE TIMES A DAY (Patient not taking: Reported on 08/15/2020), Disp: 270 capsule, Rfl: 1 .  Magnesium 250 MG TABS, Take 1 mg by mouth daily.  (Patient not taking: Reported on 08/15/2020), Disp: , Rfl:  .  NON FORMULARY, 4 capsules 2 (two) times daily.  (Patient not taking: Reported on 08/15/2020), Disp: , Rfl:  .  olopatadine (PATADAY) 0.1 % ophthalmic solution, 1 drop 2 (two) times daily. (Patient not taking: Reported on 08/15/2020), Disp: , Rfl:  .  ondansetron (ZOFRAN-ODT) 4 MG disintegrating tablet, Take 1 tablet (4 mg total) by mouth every 8 (eight) hours as needed for nausea or vomiting., Disp: 30 tablet, Rfl: 0 .  Specialty Vitamins Products (RETAINE VISION PO), Take by mouth. (Patient not taking: Reported on 08/15/2020), Disp: , Rfl:  .  Triamcinolone Acetonide (NASACORT ALLERGY 24HR NA), Place into the nose. (Patient not taking: Reported on 08/15/2020), Disp: , Rfl:  .  Turmeric (QC TUMERIC COMPLEX PO), Take by mouth. (Patient not taking: Reported on 08/15/2020), Disp: , Rfl:   Depression screen [facility] 2/9 05/24/2020 03/20/2020 07/16/2019  Decreased Interest 0 0 1  Down, Depressed, Hopeless 1 1 1   PHQ - 2 Score 1 1 2   Altered sleeping 1 0 3  Tired, decreased energy 3 3 3   Change in appetite 1 2 2   Feeling bad or failure about yourself  0 0 0  Trouble concentrating 1 2 1   Moving slowly or fidgety/restless 2 2 0  Suicidal thoughts 0 0 0  PHQ-9 Score 9 10 11   Difficult doing work/chores Somewhat difficult Somewhat difficult Somewhat difficult    GAD 7 : Generalized Anxiety Score 05/24/2020 03/20/2020 07/16/2019 12/29/2017  Nervous, Anxious, on Edge 1 3 2 2   Control/stop worrying 1 1 1 2   Worry too much - different things 1 1 1 1   Trouble relaxing 2 1 1 1   Restless 1 3 0 1  Easily annoyed or irritable 3 3 1 1   Afraid - awful might happen 1 2 1 1   Total GAD 7 Score 10 14 7 9   Anxiety Difficulty Somewhat  difficult Very difficult Somewhat difficult Somewhat difficult    -------------------------------------------------------------------------- O: No physical exam performed due to remote telephone encounter.  Physical Exam: Patient remotely monitored without video.  Verbal communication appropriate.  Cognition normal.  Recent Results (from the past 2160 hour(s))  SARS CORONAVIRUS 2 (TAT 6-24 HRS) Nasopharyngeal Nasopharyngeal Swab     Status: None   Collection Time: 05/24/20 11:22 AM   Specimen: Nasopharyngeal Swab  Result Value Ref Range   SARS Coronavirus 2 NEGATIVE NEGATIVE    Comment: (NOTE) SARS-CoV-2 target nucleic acids are NOT DETECTED. The SARS-CoV-2 RNA is generally detectable in  upper and lower respiratory specimens during the acute phase of infection. Negative results do not preclude SARS-CoV-2 infection, do not rule out co-infections with other pathogens, and should not be used as the sole basis for treatment or other patient management decisions. Negative results must be combined with clinical observations, patient history, and epidemiological information. The expected result is Negative. Fact Sheet for Patients: SugarRoll.be Fact Sheet for Healthcare Providers: https://www.woods-mathews.com/ This test is not yet approved or cleared by the [place] FDA and  has been authorized for detection and/or diagnosis of SARS-CoV-2 by FDA under an Emergency Use Authorization (EUA). This EUA will remain  in effect (meaning this test can be used) for the duration of the COVID-19 declaration under Section 56 4(b)(1) of the Act, 21 U.S.C. section 360bbb-3(b)(1), unless the authorization is terminated or revoked sooner. Performed at [facility], [place] [address] [place], [place] [address]   Surgical pathology     Status: None   Collection Time: 05/26/20 11:14 AM  Result Value Ref Range   SURGICAL PATHOLOGY      SURGICAL  PATHOLOGY CASE: [id number] PATIENT: Wyonia Hough Surgical Pathology Report     Specimen Submitted: A. Colon polyp, descending; cold snare  Clinical History: Screening colonoscopy    DIAGNOSIS: A. COLON POLYP, DESCENDING; COLD SNARE: - SESSILE SERRATED POLYP. - NEGATIVE FOR DYSPLASIA AND MALIGNANCY.  GROSS DESCRIPTION: A. Labeled: Descending colon polyp, cold snare Received: Formalin Tissue fragment(s): Multiple Size: Aggregate, 1.1 x 0.6 x 0.2 cm Description: Tan soft tissue with admixed tan-yellow fecal matter Entirely submitted in 1.    Final Diagnosis performed by Allena Napoleon, MD.   Electronically signed 05/30/2020 12:39:15PM The electronic signature indicates that the named Attending Pathologist has evaluated the specimen Technical component performed at [facility], [address] [place], [place] [address] Lab: [phone] Dir: Rush Farmer, MD, MMM  Professional component performed at [facility], [facility], [place], [place], [place] [address] Lab: [phone] Dir: Dellia Nims. Rubinas, MD     -------------------------------------------------------------------------- A&P:  Problem List Items Addressed This Visit      Other   [id number] - Primary    COVID-19 testing positive on 08/14/2020 with 2 home testing kits.  Symptomatic with diarrhea, nausea, cough, body aches and fever.  Fever and body aches responsive to ibuprofen.  Has been eating crackers for her diarrhea with some relief.  Has started vitamin C, zinc and vitamin D3.  Plan: 1. Can continue OTC vitamin C, zinc and vitamin D3.  Discussed adding in elderberry for immune support 2. Increase fluids and protein intake 3. Strict ER precautions 4. RTC PRN      Relevant Medications   ondansetron (ZOFRAN-ODT) 4 MG disintegrating tablet   Nausea    Nausea with + COVID 19 diagnosis.  Will send in ondansetron ODT 4mg  to take 1 tablet every 8 hours as needed.  To increase oral fluid  and food intake once nausea is medicated.  Plan: 1. Begin ondansetron ODT 4mg  every 8 hours as needed for nausea      Relevant Medications   ondansetron (ZOFRAN-ODT) 4 MG disintegrating tablet      Meds ordered this encounter  Medications  . ondansetron (ZOFRAN-ODT) 4 MG disintegrating tablet    Sig: Take 1 tablet (4 mg total) by mouth every 8 (eight) hours as needed for nausea or vomiting.    Dispense:  30 tablet    Refill:  0    Follow-up: - Return if symptoms worsen or fail  to improve  Patient verbalizes understanding with the above medical recommendations including the limitation of remote medical advice.  Specific follow-up and call-back criteria were given for patient to follow-up or seek medical care more urgently if needed.  - Time spent in direct consultation with patient on phone: 8 minutes  Harlin Rain, [facility] 08/15/2020, 8:18 AM

## 2020-08-15 NOTE — Assessment & Plan Note (Signed)
Nausea with + COVID 19 diagnosis.  Will send in ondansetron ODT 4mg  to take 1 tablet every 8 hours as needed.  To increase oral fluid and food intake once nausea is medicated.  Plan: 1. Begin ondansetron ODT 4mg  every 8 hours as needed for nausea

## 2020-08-24 ENCOUNTER — Encounter: Payer: Self-pay | Admitting: Student in an Organized Health Care Education/Training Program

## 2020-08-24 ENCOUNTER — Ambulatory Visit
Payer: BLUE CROSS/BLUE SHIELD | Attending: Student in an Organized Health Care Education/Training Program | Admitting: Student in an Organized Health Care Education/Training Program

## 2020-08-24 ENCOUNTER — Other Ambulatory Visit: Payer: Self-pay

## 2020-08-24 VITALS — BP 124/82 | HR 78 | Temp 97.6°F | Resp 16 | Ht 65.0 in | Wt 145.0 lb

## 2020-08-24 DIAGNOSIS — F419 Anxiety disorder, unspecified: Secondary | ICD-10-CM | POA: Insufficient documentation

## 2020-08-24 DIAGNOSIS — M549 Dorsalgia, unspecified: Secondary | ICD-10-CM | POA: Diagnosis present

## 2020-08-24 DIAGNOSIS — G243 Spasmodic torticollis: Secondary | ICD-10-CM | POA: Diagnosis not present

## 2020-08-24 DIAGNOSIS — Z79899 Other long term (current) drug therapy: Secondary | ICD-10-CM | POA: Insufficient documentation

## 2020-08-24 DIAGNOSIS — M791 Myalgia, unspecified site: Secondary | ICD-10-CM | POA: Insufficient documentation

## 2020-08-24 DIAGNOSIS — G894 Chronic pain syndrome: Secondary | ICD-10-CM

## 2020-08-24 DIAGNOSIS — M797 Fibromyalgia: Secondary | ICD-10-CM | POA: Diagnosis not present

## 2020-08-24 MED ORDER — DULOXETINE HCL 60 MG PO CPEP: 60.0000 mg | ORAL_CAPSULE | Freq: Every day | ORAL | 2 refills | Status: DC

## 2020-08-24 MED ORDER — NORTRIPTYLINE HCL 25 MG PO CAPS: ORAL_CAPSULE | ORAL | 0 refills | Status: DC

## 2020-08-24 NOTE — Progress Notes (Signed)
Safety precautions to be maintained throughout the outpatient stay will include: orient to surroundings, keep bed in low position, maintain call bell within reach at all times, provide assistance with transfer out of bed and ambulation.  

## 2020-08-24 NOTE — Progress Notes (Signed)
PROVIDER NOTE: Information contained herein reflects review and annotations entered in association with encounter. Interpretation of such information and data should be left to medically-trained personnel. Information provided to patient can be located elsewhere in the medical record under "Patient Instructions". Document created using STT-dictation technology, any transcriptional errors that may result from process are unintentional.    Patient: Denise Elliott  Service Category: E/M  Provider: Gillis Santa, MD  DOB: May 19, 1961  DOS: 08/24/2020  Specialty: Interventional Pain Management  MRN: [id number]  Setting: Ambulatory outpatient  PCP: Verl Bangs, FNP  Type: Established Patient    Referring Provider: Verl Bangs, FNP  Location: Office  Delivery: Face-to-face     HPI  Reason for encounter: Ms. Denise Elliott, a 59 y.o. year old female, is here today for evaluation and management of her Fibromyalgia [M79.7]. Ms. Mario primary complain today is Pain Last encounter: Practice (08/13/2020). My last encounter with her was on 08/13/2020. Pertinent problems: [name] has Cervical dystonia; Arthritis; Fibromyalgia; History of fibromyalgia; Occipital neuralgia of left side; Persistent postural-perceptual dizziness; and Myalgia on their pertinent problem list. Pain Assessment: Severity of Chronic pain is reported as a 6 /10. Location: Generalized  / . Onset: More than a month ago. Quality: Constant. Timing: Constant. Modifying factor(s): resting.. Vitals:  height is '5\' 5"'  (1.651 m) and weight is 145 lb (65.8 kg). Her temporal temperature is 97.6 F (36.4 C). Her blood pressure is 124/82 and her pulse is 78. Her respiration is 16 and oxygen saturation is 100%.   From initial clinic visit on 07/13/2020 "[name] is a very pleasant 59 year old female who presents with diffuse musculoskeletal pain related to fibromyalgia.  Of note she is not experiencing satisfactory pain relief with her gabapentin.   She has taken Lyrica in the past but experienced over 60 pound weight gain and edema of her face.  Patient is currently on sertraline for her depression.  She is on a reduced dose of 50 mg.  She has an appointment with her psychiatrist next month.  She is here to discuss fibromyalgia management.  Patient denies having a diet that is high in dairy intake or red meat intake.  She states that she has had an occipital nerve block many years ago with Dr. Manuella Ghazi (neurology) that was helpful for a short period of time.  Patient is not on any opioid analgesics."  At the patient's initial visit with me on 7/29, we discussed evidence-based strategies for fibromyalgia management.  Her gabapentin was discontinued at that time and she was started on Cymbalta.  At that time she was also instructed to decrease and then discontinue her Zoloft as her Cymbalta was titrated up.  Patient is noticing approximately 25% improvement in her fibromyalgia pain since discontinuing gabapentin and starting Cymbalta.  She has also discontinued her Zoloft.  This was recommended given that we were titrating up her Cymbalta.  We discussed adding nortriptyline for her fibromyalgia symptoms.  I will also obtain an EKG to evaluate QTC.  Titration instructions provided for nortriptyline as below.   ROS  Constitutional: Denies any fever or chills Gastrointestinal: No reported hemesis, hematochezia, vomiting, or acute GI distress Musculoskeletal: Diffuse musculoskeletal pain Neurological: No reported episodes of acute onset apraxia, aphasia, dysarthria, agnosia, amnesia, paralysis, loss of coordination, or loss of consciousness  Medication Review  DULoxetine, acetaminophen, hydrochlorothiazide, lisinopril, meclizine, nortriptyline, and ondansetron  History Review  Allergy: Ms. Waltermire is allergic to versed [midazolam], grapeseed extract [nutritional supplements], and proanthocyanidin. Drug: Ms.  Fromer  reports no history of drug use. Alcohol:   reports previous alcohol use. Tobacco:  reports that she has never smoked. She has never used smokeless tobacco. Social: Ms. Calvillo  reports that she has never smoked. She has never used smokeless tobacco. She reports previous alcohol use. She reports that she does not use drugs. Medical:  has a past medical history of Allergy, Anxiety, Arthritis, Cervical dystonia, Complication of anesthesia, Depression, Fibromyalgia, Fibromyalgia, GERD (gastroesophageal reflux disease), Glaucoma, Goiter, Headache, History of kidney stones, HLD (hyperlipidemia), Hypertension, Laterocollis, Plantar fasciitis, Sleep apnea, Tinnitus, Varicose veins of both legs with edema, and Vertigo. Surgical: Ms. Plunk  has a past surgical history that includes Tonsillectomy; Gallbladder surgery; Tonsillectomy; Cholecystectomy; Cosmetic surgery (Left); Colonoscopy with propofol (N/A, 05/26/2020); and polypectomy (05/26/2020). Family: family history includes AAA (abdominal aortic aneurysm) in her father; Alcohol abuse in her father and paternal grandfather; Colon cancer in her maternal uncle; Colon polyps in her mother; Goiter in her mother; Healthy in her daughter, sister, sister, sister, son, and son; Hypertension in her mother; Liver cancer in her paternal grandfather; Vision loss in her maternal grandmother.  Laboratory Chemistry Profile   Renal Lab Results  Component Value Date   BUN 15 04/27/2020   CREATININE 0.74 25/42/7062   BCR NOT APPLICABLE 37/62/8315   GFRAA 102 04/27/2020   GFRNONAA 88 04/27/2020     Hepatic Lab Results  Component Value Date   AST 30 04/27/2020   ALT 27 04/27/2020   ALBUMIN 4.3 03/27/2020   ALKPHOS 71 03/27/2020   LIPASE 24 05/07/2018     Electrolytes Lab Results  Component Value Date   NA 137 04/27/2020   K 5.4 (H) 04/27/2020   CL 99 04/27/2020   CALCIUM 9.6 04/27/2020     Bone Lab Results  Component Value Date   VD25OH 29 (L) 07/08/2017     Inflammation (CRP: Acute Phase) (ESR:  Chronic Phase) Lab Results  Component Value Date   CRP 0.7 09/20/2019   ESRSEDRATE 2 09/20/2019       Note: Above Lab results reviewed.  Recent Imaging Review  CT Renal Stone Study CLINICAL DATA:  Hematuria  EXAM: CT ABDOMEN AND PELVIS WITHOUT CONTRAST  TECHNIQUE: Multidetector CT imaging of the abdomen and pelvis was performed following the standard protocol without IV contrast.  COMPARISON:  CT 05/07/2018  FINDINGS: Lower chest: Lung bases demonstrate no acute consolidation or effusion. The heart size is within normal limits.  Hepatobiliary: Status post cholecystectomy. No focal hepatic abnormality.  Pancreas: Unremarkable. No pancreatic ductal dilatation or surrounding inflammatory changes.  Spleen: Normal in size without focal abnormality.  Adrenals/Urinary Tract: Adrenal glands are normal. Punctate stone in the upper pole of the right kidney. No hydronephrosis or ureteral stone.  Stomach/Bowel: Stomach is within normal limits. Appendix appears normal. No evidence of bowel wall thickening, distention, or inflammatory changes.  Vascular/Lymphatic: No significant vascular findings are present. No enlarged abdominal or pelvic lymph nodes.  Reproductive: Uterus and bilateral adnexa are unremarkable.  Other: No abdominal wall hernia or abnormality. No abdominopelvic ascites.  Musculoskeletal: No acute or significant osseous findings.  IMPRESSION: 1. Punctate stone within the right kidney. Negative for hydronephrosis or ureteral stone. 2. No CT evidence for acute intra-abdominal or pelvic abnormality  Electronically Signed   By: Donavan Foil M.D.   On: 03/28/2020 00:59 Note: Reviewed        Physical Exam  General appearance: Well nourished, well developed, and well hydrated. In no apparent acute distress Mental status:  Alert, oriented x 3 (person, place, & time)       Respiratory: No evidence of acute respiratory distress Eyes: PERLA Vitals: BP 124/82  (BP Location: Right Arm, Patient Position: Sitting, Cuff Size: Normal)   Pulse 78   Temp 97.6 F (36.4 C) (Temporal)   Resp 16   Ht '5\' 5"'  (1.651 m)   Wt 145 lb (65.8 kg)   LMP 02/03/2020   SpO2 100%   BMI 24.13 kg/m  BMI: Estimated body mass index is 24.13 kg/m as calculated from the following:   Height as of this encounter: '5\' 5"'  (1.651 m).   Weight as of this encounter: 145 lb (65.8 kg). Ideal: Ideal body weight: 57 kg (125 lb 10.6 oz) Adjusted ideal body weight: 60.5 kg (133 lb 6.4 oz)  Cervical Spine Exam  Skin & Axial Inspection: No masses, redness, edema, swelling, or associated skin lesions Alignment: Symmetrical Functional ROM: Unrestricted ROM      Stability: No instability detected Muscle Tone/Strength: Functionally intact. No obvious neuro-muscular anomalies detected. Sensory (Neurological): Musculoskeletal pain pattern         Upper Extremity (UE) Exam    Side: Right upper extremity  Side: Left upper extremity   Skin & Extremity Inspection: Skin color, temperature, and hair growth are WNL. No peripheral edema or cyanosis. No masses, redness, swelling, asymmetry, or associated skin lesions. No contractures.  Skin & Extremity Inspection: Skin color, temperature, and hair growth are WNL. No peripheral edema or cyanosis. No masses, redness, swelling, asymmetry, or associated skin lesions. No contractures.   Functional ROM: Unrestricted ROM          Functional ROM: Unrestricted ROM           Muscle Tone/Strength: Functionally intact. No obvious neuro-muscular anomalies detected.   Muscle Tone/Strength: Functionally intact. No obvious neuro-muscular anomalies detected.   Sensory (Neurological): Musculoskeletal pain pattern          Sensory (Neurological): Musculoskeletal pain pattern                Provocative Test(s):  Phalen's test: deferred Tinel's test: deferred Apley's scratch test (touch opposite shoulder):  Action 1 (Across chest): deferred Action 2  (Overhead): deferred Action 3 (LB reach): deferred   Provocative Test(s):  Phalen's test: deferred Tinel's test: deferred Apley's scratch test (touch opposite shoulder):  Action 1 (Across chest): deferred Action 2 (Overhead): deferred Action 3 (LB reach): deferred     Thoracic Spine Area Exam  Skin & Axial Inspection: No masses, redness, or swelling Alignment: Symmetrical Functional ROM: Unrestricted ROM Stability: No instability detected Muscle Tone/Strength: Functionally intact. No obvious neuro-muscular anomalies detected. Sensory (Neurological): Unimpaired Muscle strength & Tone: No palpable anomalies  Lumbar Exam  Skin & Axial Inspection: No masses, redness, or swelling Alignment: Symmetrical Functional ROM: Unrestricted ROM       Stability: No instability detected Muscle Tone/Strength: Functionally intact. No obvious neuro-muscular anomalies detected. Sensory (Neurological): Musculoskeletal pain pattern  Gait & Posture Assessment  Ambulation: Unassisted Gait: Relatively normal for age and body habitus Posture: WNL   Lower Extremity Exam    Side: Right lower extremity  Side: Left lower extremity  Stability: No instability observed          Stability: No instability observed          Skin & Extremity Inspection: Skin color, temperature, and hair growth are WNL. No peripheral edema or cyanosis. No masses, redness, swelling, asymmetry, or associated skin lesions. No contractures.  Skin & Extremity Inspection: Skin  color, temperature, and hair growth are WNL. No peripheral edema or cyanosis. No masses, redness, swelling, asymmetry, or associated skin lesions. No contractures.  Functional ROM: Unrestricted ROM                  Functional ROM: Unrestricted ROM                  Muscle Tone/Strength: Functionally intact. No obvious neuro-muscular anomalies detected.  Muscle Tone/Strength: Functionally intact. No obvious neuro-muscular anomalies detected.  Sensory  (Neurological): Musculoskeletal pain pattern        Sensory (Neurological): Musculoskeletal pain pattern        DTR: Patellar: deferred today Achilles: deferred today Plantar: deferred today  DTR: Patellar: deferred today Achilles: deferred today Plantar: deferred today  Palpation: No palpable anomalies  Palpation: No palpable anomalies     Assessment   Status Diagnosis  Controlled Controlled Controlled 1. Fibromyalgia   2. Anxiety   3. Cervical dystonia   4. Myalgia   5. Arthralgia of back   6. Chronic pain syndrome   7. Pharmacologic therapy      Updated Problems: Problem  Myalgia  Persistent Postural-Perceptual Dizziness   Dx by [facility] Dr. Manuella Ghazi 02/19/2019 Note  had been dx with PPPD (persistent postural perceptual dizziness) dx if symptoms present greater than 3 months, > 15 days per month, Symptoms are worse with upright posture (standing or sitting upright), head or body motion, exposure to complex or motion-rich environments. PPPD may coexist with other vestibular disorders. SSRI and SNRI is likely to be more beneficial than benzodiazepine medications. Ongoing vestibular rehab is also important. - Per Dr. Manuella Ghazi  Dr. Sanjuana Kava., MD at [facility] and [facility] is an Otologist/Neurotologist office located in [place], [place] which specializes in intractable dizziness. For more information: Phone: [phone]; Fax: [phone]; [email or web address]; [address], [place], [place] [address].   Arthritis  Fibromyalgia  History of Fibromyalgia  Occipital Neuralgia of Left Side  Cervical Dystonia    Plan of Care  Ms. TELISSA PALMISANO has a current medication list which includes the following long-term medication(s): hydrochlorothiazide, lisinopril, duloxetine, and nortriptyline.  1.  Refill Cymbalta at 60 mg. 2.  Start nortriptyline at 25 mg with titration instructions as below. 3.  EKG to evaluate QTC in the context of starting  TCA  Pharmacotherapy (Medications Ordered): Meds ordered this encounter  Medications  . nortriptyline (PAMELOR) 25 MG capsule    Sig: Take 1 capsule (25 mg total) by mouth at bedtime for 45 days, THEN 2 capsules (50 mg total) at bedtime.    Dispense:  135 capsule    Refill:  0  . DULoxetine (CYMBALTA) 60 MG capsule    Sig: Take 1 capsule (60 mg total) by mouth daily.    Dispense:  30 capsule    Refill:  2   Orders:  Orders Placed This Encounter  Procedures  . EKG 12-Lead    This ECG is being ordered as part of a work-up prior to high-risk medication prescribing.    Scheduling Instructions:     Please evaluate for QT/QTc prolongation, as well as 2nd degree AV blocks prior to high-risk medication therapy.   Follow-up plan:   Return in about 3 months (around 11/23/2020) for Medication Management, in person.     Failed gabapentin, side effects with Lyrica.  On Cymbalta.  Addition of TCA: Nortriptyline, follow-up EKG   Recent Visits Date Type Provider Dept  07/13/20  Office Visit Gillis Santa, MD [facility]  Showing recent visits within past 90 days and meeting all other requirements Today's Visits Date Type Provider Dept  08/24/20 Office Visit Gillis Santa, MD [facility]  Showing today's visits and meeting all other requirements Future Appointments No visits were found meeting these conditions. Showing future appointments within next 90 days and meeting all other requirements  I discussed the assessment and treatment plan with the patient. The patient was provided an opportunity to ask questions and all were answered. The patient agreed with the plan and demonstrated an understanding of the instructions.  Patient advised to call back or seek an in-person evaluation if the symptoms or condition worsens.  Duration of encounter:30 minutes.  Note by: Gillis Santa, MD Date: 08/24/2020; Time: 9:41 AM

## 2020-08-28 ENCOUNTER — Ambulatory Visit: Payer: No Typology Code available for payment source | Admitting: Family Medicine

## 2020-08-29 ENCOUNTER — Ambulatory Visit: Payer: No Typology Code available for payment source | Admitting: Family Medicine

## 2020-09-15 ENCOUNTER — Other Ambulatory Visit: Payer: Self-pay | Admitting: Family Medicine

## 2020-09-15 ENCOUNTER — Other Ambulatory Visit: Payer: Self-pay | Admitting: Student in an Organized Health Care Education/Training Program

## 2020-09-15 DIAGNOSIS — I10 Essential (primary) hypertension: Secondary | ICD-10-CM

## 2020-09-19 ENCOUNTER — Ambulatory Visit: Payer: Self-pay

## 2020-09-19 NOTE — Telephone Encounter (Signed)
Pt. Reports she had COVID 19 08/13/20. Still has lingering shortness of breath with tasks "like walking the dog and emptying the dishwasher." No other symptoms. Appointment made.  Reason for Disposition . [1] MODERATE longstanding difficulty breathing (e.g., speaks in phrases, SOB even at rest, pulse 100-120) AND [2] SAME as normal  Answer Assessment - Initial Assessment Questions 1. RESPIRATORY STATUS: "Describe your breathing?" (e.g., wheezing, shortness of breath, unable to speak, severe coughing)      Shortness of breath 2. ONSET: "When did this breathing problem begin?"      August 29 with COVID 19 3. PATTERN "Does the difficult breathing come and go, or has it been constant since it started?"      Comes and goes 4. SEVERITY: "How bad is your breathing?" (e.g., mild, moderate, severe)    - MILD: No SOB at rest, mild SOB with walking, speaks normally in sentences, can lay down, no retractions, pulse < 100.    - MODERATE: SOB at rest, SOB with minimal exertion and prefers to sit, cannot lie down flat, speaks in phrases, mild retractions, audible wheezing, pulse 100-120.    - SEVERE: Very SOB at rest, speaks in single words, struggling to breathe, sitting hunched forward, retractions, pulse > 120      Mild 5. RECURRENT SYMPTOM: "Have you had difficulty breathing before?" If Yes, ask: "When was the last time?" and "What happened that time?"      No 6. CARDIAC HISTORY: "Do you have any history of heart disease?" (e.g., heart attack, angina, bypass surgery, angioplasty)      No 7. LUNG HISTORY: "Do you have any history of lung disease?"  (e.g., pulmonary embolus, asthma, emphysema)     No 8. CAUSE: "What do you think is causing the breathing problem?"      COVID 19 9. OTHER SYMPTOMS: "Do you have any other symptoms? (e.g., dizziness, runny nose, cough, chest pain, fever)     No 10. PREGNANCY: "Is there any chance you are pregnant?" "When was your last menstrual period?"       No 11.  TRAVEL: "Have you traveled out of the country in the last month?" (e.g., travel history, exposures)       No  Protocols used: BREATHING DIFFICULTY-A-AH

## 2020-09-21 ENCOUNTER — Other Ambulatory Visit: Payer: Self-pay

## 2020-09-21 ENCOUNTER — Encounter: Payer: Self-pay | Admitting: Family Medicine

## 2020-09-21 ENCOUNTER — Ambulatory Visit (INDEPENDENT_AMBULATORY_CARE_PROVIDER_SITE_OTHER): Payer: No Typology Code available for payment source | Admitting: Family Medicine

## 2020-09-21 VITALS — BP 107/63 | HR 85 | Temp 97.9°F | Resp 18 | Ht 65.0 in | Wt 161.6 lb

## 2020-09-21 DIAGNOSIS — R0602 Shortness of breath: Secondary | ICD-10-CM | POA: Insufficient documentation

## 2020-09-21 DIAGNOSIS — R635 Abnormal weight gain: Secondary | ICD-10-CM

## 2020-09-21 MED ORDER — MONTELUKAST SODIUM 10 MG PO TABS: 10.0000 mg | ORAL_TABLET | Freq: Every day | ORAL | 3 refills | Status: DC

## 2020-09-21 MED ORDER — ALBUTEROL SULFATE HFA 108 (90 BASE) MCG/ACT IN AERS: 1.0000 | INHALATION_SPRAY | Freq: Four times a day (QID) | RESPIRATORY_TRACT | 1 refills | Status: DC | PRN

## 2020-09-21 NOTE — Assessment & Plan Note (Signed)
Shortness of breath s/p COVID.  Discussed symptoms can linger for a while after a dx of COVID.  Has not had a dx of asthma, COPD or RAD in the past.  Discussed referral to pulmonology for PFTs, and that we could start on singulair to see if this helps with her shortness of breath and send in a prescription for a rescue inhaler, should she feel very short of breath.  Discussed could also be related to deconditioning with weight gain and decrease in ability to walk for > 2 hours daily as she was doing previously.  Plan: 1. Referral to pulmonology placed 2. Begin singulair 10mg  daily 3. Can take albuterol FHA 1-2 puffs every 4-6 hours as needed for shortness of breath 4. RTC after meeting with pulmonology provider

## 2020-09-21 NOTE — Patient Instructions (Signed)
A referral to Pulmonary has been placed today.  If you have not heard from the specialty office or our referral coordinator within 1 week, please let us know and we will follow up with the referral coordinator for an update.  I have sent in a prescription for an albuterol inhaler.  Can take 1-2 puffs every 4-6 hours as needed for shortness of breath and/or wheezing.  We have given you a spacer today in clinic, can use this help coordinate getting the medication into your lungs.  I have sent in a prescription for singulair 10mg , to take 1 tablet nightly at bedtime.  This medication has been shown to be useful in patients with asthma and/or COPD.  We will plan to see you back in 3 months for hypertension follow up visit  You will receive a survey after today's visit either digitally by e-mail or paper by [place] mail. Your experiences and feedback matter to [place].  Please respond so we know how we are doing as we provide care for you.  Call us with any questions/concerns/needs.  It is my goal to be available to you for your health concerns.  Thanks for choosing me to be a partner in your healthcare needs!  Harlin Rain, FNP-C Family Nurse Practitioner [facility] Phone: [phone]

## 2020-09-21 NOTE — Telephone Encounter (Signed)
OV 09/21/2020

## 2020-09-21 NOTE — Assessment & Plan Note (Signed)
33lb weight gain since 04/26/2020.  Reports has changed her diet for the worse, has had less exercise tolerance with increased SOB.  Has recently started on duloxetine and nortriptyline.  Discussed side effect of nortriptyline and dry mouth, which has been associated with weight gain.  Discussed working towards healthy choices.  Plan: 1. Work towards healthy dietary changes 2. To work on actively decreasing caloric intake

## 2020-09-21 NOTE — Progress Notes (Signed)
Subjective:    Patient ID: Denise Elliott, female    DOB: [birth year], 59 y.o.   MRN: [id number]  Denise Elliott is a 59 y.o. female presenting on 09/21/2020 for Post COVID (intermittent SOB post COVID, diagnose with COVID 08/13/20. The symptoms worsen with walking, but state sometimes sitting Denise Elliott notice the SOB ) and Weight Gain (pt had a steady weight gain since June 2021. 128lb- 161lbs. Pt reports diet changed since Denise Elliott husband was diagnose with Prostate Cancer )   HPI  Denise Elliott presents to clinic for concerns of intermittent shortness of breath since being diagnosed with COVID on 08/13/2020.  Denise Elliott has had shortness of breath with walking, but sometimes will have while sitting still.  Denies cough, dizziness, lightheadedness, palpitations, CP, lower extremity edema.  No past history of asthma, COPD or reactive airway disease.  Has had some steady weight gain since June 2021, increasing Denise Elliott weight approximately 33lbs.  Reports Denise Elliott has changed Denise Elliott dietary intake for the worse since Denise Elliott husband has fallen ill.  Reports is not walking for 2 hours as day, as Denise Elliott had been doing in the past due to Denise Elliott shortness of breath after COVID.  Depression screen [facility] 2/9 08/24/2020 05/24/2020 03/20/2020  Decreased Interest 0 0 0  Down, Depressed, Hopeless 0 1 1  PHQ - 2 Score 0 1 1  Altered sleeping - 1 0  Tired, decreased energy - 3 3  Change in appetite - 1 2  Feeling bad or failure about yourself  - 0 0  Trouble concentrating - 1 2  Moving slowly or fidgety/restless - 2 2  Suicidal thoughts - 0 0  PHQ-9 Score - 9 10  Difficult doing work/chores - Somewhat difficult Somewhat difficult    Social History   Tobacco Use  . Smoking status: Never Smoker  . Smokeless tobacco: Never Used  Vaping Use  . Vaping Use: Never used  Substance Use Topics  . Alcohol use: Not Currently    Comment: socially  . Drug use: No    Review of Systems  Constitutional: Negative.   HENT: Negative.   Eyes: Negative.     Respiratory: Positive for shortness of breath. Negative for apnea, cough, choking, chest tightness, wheezing and stridor.   Cardiovascular: Negative.   Gastrointestinal: Negative.   Endocrine: Negative.   Genitourinary: Negative.   Musculoskeletal: Negative.   Skin: Negative.   Allergic/Immunologic: Negative.   Neurological: Negative.   Hematological: Negative.   Psychiatric/Behavioral: Negative.    Per HPI unless specifically indicated above     Objective:    BP 107/63 (BP Location: Left Arm, Patient Position: Sitting, Cuff Size: Normal)   Pulse 85   Temp 97.9 F (36.6 C)   Resp 18   Ht 5\' 5"  (1.651 m)   Wt 161 lb 9.6 oz (73.3 kg)   LMP 02/03/2020   SpO2 100%   BMI 26.89 kg/m   Wt Readings from Last 3 Encounters:  09/21/20 161 lb 9.6 oz (73.3 kg)  08/24/20 145 lb (65.8 kg)  07/13/20 139 lb (63 kg)    Physical Exam Vitals and nursing note reviewed.  Constitutional:      General: Denise Elliott is not in acute distress.    Appearance: Normal appearance. Denise Elliott is well-developed, well-groomed and overweight. Denise Elliott is not ill-appearing or toxic-appearing.  HENT:     Head: Normocephalic and atraumatic.     Nose:     Comments: Denise Elliott is in place, covering mouth and nose. Eyes:  General: Lids are normal. Vision grossly intact.        Right eye: No discharge.        Left eye: No discharge.     Extraocular Movements: Extraocular movements intact.     Conjunctiva/sclera: Conjunctivae normal.     Pupils: Pupils are equal, round, and reactive to light.  Cardiovascular:     Rate and Rhythm: Normal rate and regular rhythm.     Pulses: Normal pulses.          Dorsalis pedis pulses are 2+ on the right side and 2+ on the left side.     Heart sounds: Normal heart sounds. No murmur heard.  No friction rub. No gallop.   Pulmonary:     Effort: Pulmonary effort is normal. No accessory muscle usage, prolonged expiration or respiratory distress.     Breath sounds: Normal breath sounds. No  stridor. No decreased breath sounds, wheezing, rhonchi or rales.  Musculoskeletal:     Right lower leg: No edema.     Left lower leg: No edema.  Skin:    General: Skin is warm and dry.     Capillary Refill: Capillary refill takes less than 2 seconds.  Neurological:     General: No focal deficit present.     Mental Status: Denise Elliott is alert and oriented to person, place, and time.  Psychiatric:        Attention and Perception: Attention and perception normal.        Mood and Affect: Mood and affect normal.        Speech: Speech normal.        Behavior: Behavior normal. Behavior is cooperative.        Thought Content: Thought content normal.        Cognition and Memory: Cognition and memory normal.        Judgment: Judgment normal.    Results for orders placed or performed during the hospital encounter of 05/26/20  Surgical pathology  Result Value Ref Range   SURGICAL PATHOLOGY      SURGICAL PATHOLOGY CASE: [id number] PATIENT: Wyonia Hough Surgical Pathology Report     Specimen Submitted: A. Colon polyp, descending; cold snare  Clinical History: Screening colonoscopy    DIAGNOSIS: A. COLON POLYP, DESCENDING; COLD SNARE: - SESSILE SERRATED POLYP. - NEGATIVE FOR DYSPLASIA AND MALIGNANCY.  GROSS DESCRIPTION: A. Labeled: Descending colon polyp, cold snare Received: Formalin Tissue fragment(s): Multiple Size: Aggregate, 1.1 x 0.6 x 0.2 cm Description: Tan soft tissue with admixed tan-yellow fecal matter Entirely submitted in 1.    Final Diagnosis performed by Allena Napoleon, MD.   Electronically signed 05/30/2020 12:39:15PM The electronic signature indicates that the named Attending Pathologist has evaluated the specimen Technical component performed at [facility], [address] [place], [place] [address] Lab: [phone] Dir: Rush Farmer, MD, MMM  Professional component performed at [facility], [facility], [place], [place], [place] [address]  Lab: [phone] Dir: Dellia Nims. Reuel Derby, MD       Assessment & Plan:   Problem List Items Addressed This Visit      Other   Shortness of breath - Primary    Shortness of breath s/p COVID.  Discussed symptoms can linger for a while after a dx of COVID.  Has not had a dx of asthma, COPD or RAD in the past.  Discussed referral to pulmonology for PFTs, and that we could start on singulair to see if this helps with Denise Elliott shortness of breath and  send in a prescription for a rescue inhaler, should Denise Elliott feel very short of breath.  Discussed could also be related to deconditioning with weight gain and decrease in ability to walk for > 2 hours daily as Denise Elliott was doing previously.  Plan: 1. Referral to pulmonology placed 2. Begin singulair 10mg  daily 3. Can take albuterol FHA 1-2 puffs every 4-6 hours as needed for shortness of breath 4. RTC after meeting with pulmonology provider      Relevant Medications   montelukast (SINGULAIR) 10 MG tablet   albuterol (PROAIR HFA) 108 (90 Base) MCG/ACT inhaler   Other Relevant Orders   Ambulatory referral to Pulmonology   Weight gain    33lb weight gain since 04/26/2020.  Reports has changed Denise Elliott diet for the worse, has had less exercise tolerance with increased SOB.  Has recently started on duloxetine and nortriptyline.  Discussed side effect of nortriptyline and dry mouth, which has been associated with weight gain.  Discussed working towards healthy choices.  Plan: 1. Work towards healthy dietary changes 2. To work on actively decreasing caloric intake         Meds ordered this encounter  Medications  . montelukast (SINGULAIR) 10 MG tablet    Sig: Take 1 tablet (10 mg total) by mouth at bedtime.    Dispense:  30 tablet    Refill:  3  . albuterol (PROAIR HFA) 108 (90 Base) MCG/ACT inhaler    Sig: Inhale 1-2 puffs into the lungs every 6 (six) hours as needed for wheezing or shortness of breath.    Dispense:  8 g    Refill:  1    Follow up  plan: Return in about 3 months (around 12/22/2020) for HTN F/U visit.   Harlin Rain, [facility] Practitioner [facility] 09/21/2020, 10:09 AM

## 2020-09-22 ENCOUNTER — Other Ambulatory Visit: Payer: Self-pay | Admitting: Family Medicine

## 2020-09-22 DIAGNOSIS — F419 Anxiety disorder, unspecified: Secondary | ICD-10-CM

## 2020-10-27 ENCOUNTER — Encounter: Payer: Self-pay | Admitting: Urology

## 2020-10-27 ENCOUNTER — Ambulatory Visit: Payer: Self-pay | Admitting: Urology

## 2020-11-01 ENCOUNTER — Institutional Professional Consult (permissible substitution): Payer: Self-pay | Admitting: Pulmonary Disease

## 2020-11-14 ENCOUNTER — Other Ambulatory Visit: Payer: Self-pay | Admitting: Student in an Organized Health Care Education/Training Program

## 2020-11-15 ENCOUNTER — Encounter: Payer: Self-pay | Admitting: Family Medicine

## 2020-11-15 DIAGNOSIS — M797 Fibromyalgia: Secondary | ICD-10-CM

## 2020-11-15 DIAGNOSIS — Z1231 Encounter for screening mammogram for malignant neoplasm of breast: Secondary | ICD-10-CM

## 2020-11-16 ENCOUNTER — Ambulatory Visit: Payer: Self-pay

## 2020-11-16 MED ORDER — NORTRIPTYLINE HCL 25 MG PO CAPS: 50.0000 mg | ORAL_CAPSULE | Freq: Every day | ORAL | 1 refills | Status: DC

## 2020-11-16 MED ORDER — DULOXETINE HCL 60 MG PO CPEP: 60.0000 mg | ORAL_CAPSULE | Freq: Every day | ORAL | 1 refills | Status: DC

## 2020-11-16 NOTE — Telephone Encounter (Signed)
Pt. Notified, will have husband take her to UC.

## 2020-11-16 NOTE — Telephone Encounter (Signed)
No appointment available for today. You can offer the patient an appt for tomorrow or advise her to go to the urgent care.

## 2020-11-16 NOTE — Telephone Encounter (Signed)
Pt. Has been having left ear pain and sinus drainage x 4 days. Mucus is yellow-green. Has dizziness with this as well. No availability. Would like to be worked in if possible.Office currently closed for lunch.Please advise pt.  Reason for Disposition  [1] MODERATE dizziness (e.g., interferes with normal activities) AND [2] has NOT been evaluated by physician for this  (Exception: dizziness caused by heat exposure, sudden standing, or poor fluid intake)  Answer Assessment - Initial Assessment Questions 1. DESCRIPTION: "Describe your dizziness."     Dizzy 2. LIGHTHEADED: "Do you feel lightheaded?" (e.g., somewhat faint, woozy, weak upon standing)     Yes 3. VERTIGO: "Do you feel like either you or the room is spinning or tilting?" (i.e. vertigo)     No 4. SEVERITY: "How bad is it?"  "Do you feel like you are going to faint?" "Can you stand and walk?"   - MILD: Feels slightly dizzy, but walking normally.   - MODERATE: Feels very unsteady when walking, but not falling; interferes with normal activities (e.g., school, work) .   - SEVERE: Unable to walk without falling, or requires assistance to walk without falling; feels like passing out now.      Moderate 5. ONSET:  "When did the dizziness begin?"     4 days ago 6. AGGRAVATING FACTORS: "Does anything make it worse?" (e.g., standing, change in head position)     Movement 7. HEART RATE: "Can you tell me your heart rate?" "How many beats in 15 seconds?"  (Note: not all patients can do this)       No 8. CAUSE: "What do you think is causing the dizziness?"     Sinus 9. RECURRENT SYMPTOM: "Have you had dizziness before?" If Yes, ask: "When was the last time?" "What happened that time?"     Yes 10. OTHER SYMPTOMS: "Do you have any other symptoms?" (e.g., fever, chest pain, vomiting, diarrhea, bleeding)       Sinus drainage 11. PREGNANCY: "Is there any chance you are pregnant?" "When was your last menstrual period?"       No  Protocols used:  DIZZINESS [facility]

## 2020-11-17 ENCOUNTER — Encounter: Payer: Self-pay | Admitting: Emergency Medicine

## 2020-11-17 ENCOUNTER — Other Ambulatory Visit: Payer: Self-pay

## 2020-11-17 ENCOUNTER — Ambulatory Visit
Admission: EM | Admit: 2020-11-17 | Discharge: 2020-11-17 | Disposition: A | Discharge status: Home / Self Care | Payer: BLUE CROSS/BLUE SHIELD | Attending: Family Medicine | Admitting: Family Medicine

## 2020-11-17 DIAGNOSIS — Z20822 Contact with and (suspected) exposure to covid-19: Secondary | ICD-10-CM | POA: Diagnosis not present

## 2020-11-17 DIAGNOSIS — J069 Acute upper respiratory infection, unspecified: Secondary | ICD-10-CM | POA: Diagnosis not present

## 2020-11-17 DIAGNOSIS — H66002 Acute suppurative otitis media without spontaneous rupture of ear drum, left ear: Secondary | ICD-10-CM | POA: Diagnosis not present

## 2020-11-17 LAB — RESP PANEL BY RT-PCR (FLU A&B, COVID) ARPGX2
Influenza A by PCR: NEGATIVE
Influenza B by PCR: NEGATIVE
SARS Coronavirus 2 by RT PCR: NEGATIVE

## 2020-11-17 MED ORDER — AMOXICILLIN-POT CLAVULANATE 875-125 MG PO TABS: 1.0000 | ORAL_TABLET | Freq: Two times a day (BID) | ORAL | 0 refills | Status: AC

## 2020-11-17 NOTE — ED Provider Notes (Signed)
[facility]    CSN: [id number] Arrival date & time: 11/17/20  0944      History   Chief Complaint Chief Complaint  Patient presents with  . Sinus Problem  . Headache  . Otalgia    left    HPI Denise Elliott is a 59 y.o. female.   HPI   60 year old female here for evaluation of sinus congestion, nasal drainage, headache, and left ear pain.  Patient reports that her symptoms started 4 days ago.  She denies fever, shortness of breath, body aches, vomiting or diarrhea.  Patient has had some intermittent dizziness, green nasal discharge and postnasal drip, nausea, left-sided headache and a dry cough.  Patient had Covid back in July.  Past Medical History:  Diagnosis Date  . Allergy   . Anxiety   . Arthritis    in neck, hands  . Cervical dystonia    bone spurs in neck  . Complication of anesthesia    versed allergy  . Depression   . Fibromyalgia    muscle pain  . Fibromyalgia   . GERD (gastroesophageal reflux disease)    Hx of, no issues at this point  . Glaucoma    closed angle glaucoma both eyes, laser treatment in past  . Goiter    thyroid  . Headache    sinus headaches, inner ear imbalance, light sensitivity  . History of kidney stones   . HLD (hyperlipidemia)    statin intolerance  . Hypertension    controlled on meds  . 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 Plantar fasciitis   . Sleep apnea    used CPAP, lost 60 pounds no issue now  . Tinnitus    wears hearing aids  . Varicose veins of both legs with edema   . Vertigo    inner ear imbalance    Patient Active Problem List   Diagnosis Date Noted  . Shortness of breath 09/21/2020  . Weight gain 09/21/2020  . COVID-19 08/15/2020  . Nausea 08/15/2020  . Myalgia 07/13/2020  . Arthralgia of back 07/13/2020  . Special screening for malignant neoplasms, colon   . Polyp of descending colon   . Persistent postural-perceptual dizziness 04/26/2020  .  Joint pain 04/26/2020  . Screening for colon cancer 04/26/2020  . Statin intolerance 04/26/2020  . Toenail fungus 04/26/2020  . Moderate recurrent major depression ([place]) 06/17/2019  . GERD (gastroesophageal reflux disease) 07/29/2017  . Arthritis 07/29/2017  . Fibromyalgia 07/29/2017  . HLD (hyperlipidemia) 07/29/2017  . Hypertension 07/29/2017  . Circulation disorder of lower extremity 07/29/2017  . Anxiety 06/29/2017  . Encounter to establish care 06/29/2017  . History of fibromyalgia 03/25/2017  . 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 Occipital neuralgia of left side 03/25/2017  . Cervical dystonia 10/19/2016    Past Surgical History:  Procedure Laterality Date  . CHOLECYSTECTOMY    . COLONOSCOPY WITH PROPOFOL N/A 05/26/2020   Procedure: COLONOSCOPY WITH PROPOFOL;  Surgeon: Lucilla Lame, MD;  Location: [facility];  Service: Endoscopy;  Laterality: N/A;  priority 4  . COSMETIC SURGERY Left    broken cheek bone  . GALLBLADDER SURGERY    . POLYPECTOMY  05/26/2020   Procedure: POLYPECTOMY;  Surgeon: Lucilla Lame, MD;  Location: [facility];  Service: Endoscopy;;  . TONSILLECTOMY    . TONSILLECTOMY      OB History    Gravida  5   Para  3   Term  2   Preterm      AB  2   Living  3     SAB  2   TAB      Ectopic      Multiple      Live Births  3            Home Medications    Prior to Admission medications   Medication Sig Start Date End Date Taking? Authorizing Provider  DULoxetine (CYMBALTA) 60 MG capsule Take 1 capsule (60 mg total) by mouth daily. 11/16/20 02/14/21 Yes Malfi, Lupita Raider, FNP  hydrochlorothiazide (HYDRODIURIL) 12.5 MG tablet Take 1 tablet (12.5 mg total) by mouth daily. 06/16/20  Yes Malfi, Lupita Raider, FNP  lisinopril (ZESTRIL) 10 MG tablet TAKE 1 TABLET BY MOUTH EVERY DAY 09/15/20  Yes Malfi, Lupita Raider, FNP  meclizine (ANTIVERT) 12.5 MG tablet Take 12.5 mg by mouth in the morning, at noon, in the evening,  and at bedtime.   Yes [provider]  montelukast (SINGULAIR) 10 MG tablet Take 1 tablet (10 mg total) by mouth at bedtime. 09/21/20  Yes Malfi, Lupita Raider, FNP  nortriptyline (PAMELOR) 25 MG capsule Take 2 capsules (50 mg total) by mouth at bedtime. 11/16/20 02/14/21 Yes Malfi, Lupita Raider, FNP  albuterol (PROAIR HFA) 108 (90 Base) MCG/ACT inhaler Inhale 1-2 puffs into the lungs every 6 (six) hours as needed for wheezing or shortness of breath. 09/21/20   Malfi, Lupita Raider, FNP  amoxicillin-clavulanate (AUGMENTIN) 875-125 MG tablet Take 1 tablet by mouth every 12 (twelve) hours for 10 days. 11/17/20 11/27/20  Margarette Canada, NP    Family History Family History  Problem Relation Age of Onset  . Colon polyps Mother   . Hypertension Mother   . Goiter Mother   . AAA (abdominal aortic aneurysm) Father   . Alcohol abuse Father   . Healthy Sister   . Healthy Daughter   . Healthy Son   . Colon cancer Maternal Uncle   . Vision loss Maternal Grandmother   . Liver cancer Paternal Grandfather   . Alcohol abuse Paternal Grandfather   . Healthy Sister   . Healthy Sister   . Healthy Son   . Bladder Cancer Neg Hx   . Kidney cancer Neg Hx   . Prostate cancer Neg Hx   . Breast cancer Neg Hx     Social History Social History   Tobacco Use  . Smoking status: Never Smoker  . Smokeless tobacco: Never Used  Vaping Use  . Vaping Use: Never used  Substance Use Topics  . Alcohol use: Not Currently    Comment: socially  . Drug use: No     Allergies   Versed [midazolam], Grapeseed extract [nutritional supplements], and Proanthocyanidin   Review of Systems Review of Systems  Constitutional: Negative for activity change, appetite change and fever.  HENT: Positive for congestion, ear pain, rhinorrhea, sinus pressure, sinus pain and tinnitus. Negative for sore throat.   Respiratory: Positive for cough. Negative for shortness of breath and wheezing.   Cardiovascular: Negative for chest pain.    Gastrointestinal: Positive for nausea. Negative for diarrhea and vomiting.  Musculoskeletal: Negative for arthralgias and myalgias.  Skin: Negative.   Neurological: Positive for dizziness and headaches. Negative for syncope.  Hematological: Negative.   Psychiatric/Behavioral: Negative.      Physical Exam Triage Vital Signs ED Triage Vitals  Enc Vitals Group     BP 11/17/20 1001 132/86  Pulse Rate 11/17/20 1001 90     Resp 11/17/20 1001 14     Temp 11/17/20 1001 98.3 F (36.8 C)     Temp Source 11/17/20 1001 Oral     SpO2 11/17/20 1001 100 %     Weight 11/17/20 0958 170 lb (77.1 kg)     Height 11/17/20 0958 5' 5.5" (1.664 m)     Head Circumference --      Peak Flow --      Pain Score 11/17/20 0958 6     Pain Loc --      Pain Edu? --      Excl. in [place]? --    No data found.  Updated Vital Signs BP 132/86 (BP Location: Right Arm)   Pulse 90   Temp 98.3 F (36.8 C) (Oral)   Resp 14   Ht 5' 5.5" (1.664 m)   Wt 170 lb (77.1 kg)   LMP 02/03/2020   SpO2 100%   BMI 27.86 kg/m   Visual Acuity Right Eye Distance:   Left Eye Distance:   Bilateral Distance:    Right Eye Near:   Left Eye Near:    Bilateral Near:     Physical Exam Vitals and nursing note reviewed.  Constitutional:      General: She is not in acute distress.    Appearance: She is well-developed and normal weight. She is not toxic-appearing.  HENT:     Head: Normocephalic and atraumatic.     Comments: Left tympanic membrane is erythematous with a large milky serous effusion.  Right tympanic membrane is normal.  Both external auditory canals are unremarkable.  Nasal mucosa is erythematous and edematous L>R, there is mixed bloody and green nasal discharge in the left nare.  Patient has marked tenderness to percussion to left maxillary sinus and a little left to left frontal sinus right frontal and maxillary sinus are not tender to percussion.    Mouth/Throat:     Mouth: Mucous membranes are moist.      Comments: Patient has been mild erythema in the posterior oropharynx with green postnasal drip. Eyes:     General: No scleral icterus.    Extraocular Movements: Extraocular movements intact.     Right eye: No nystagmus.     Left eye: No nystagmus.     Pupils: Pupils are equal, round, and reactive to light. Pupils are equal.  Neck:     Comments: Patient has bilateral anterior cervical lymphadenopathy. Cardiovascular:     Rate and Rhythm: Normal rate and regular rhythm.     Heart sounds: Normal heart sounds. No murmur heard.  No gallop.   Pulmonary:     Effort: Pulmonary effort is normal.     Breath sounds: Normal breath sounds. No wheezing, rhonchi or rales.  Musculoskeletal:        General: No swelling. Normal range of motion.     Cervical back: Normal range of motion and neck supple.  Lymphadenopathy:     Cervical: Cervical adenopathy present.  Skin:    General: Skin is warm and dry.     Capillary Refill: Capillary refill takes less than 2 seconds.     Findings: No erythema or rash.  Neurological:     Mental Status: She is alert and oriented to person, place, and time.     GCS: GCS eye subscore is 4. GCS verbal subscore is 5. GCS motor subscore is 6.  Psychiatric:        Mood and  Affect: Mood normal.        Speech: Speech normal.        Behavior: Behavior normal.      UC Treatments / Results  Labs (all labs ordered are listed, but only abnormal results are displayed) Labs Reviewed  RESP PANEL BY RT-PCR (FLU A&B, COVID) [id number]    EKG   Radiology No results found.  Procedures Procedures (including critical care time)  Medications Ordered in UC Medications - No data to display  Initial Impression / Assessment and Plan / UC Course  I have reviewed the triage vital signs and the nursing notes.  Pertinent labs & imaging results that were available during my care of the patient were reviewed by me and considered in my medical decision making (see chart for  details).   Patient is here for evaluation of upper respiratory symptoms with left ear pain.  Physical exam reveals erythematous left TM with a large milky serous effusion.  Patient also has marked tenderness to percussion of her left maxillary and frontal sinus and there is purulent discharge in the nare.  Patient also has purulent postnasal drip.  We will treat patient for otitis media with Augmentin twice daily x10 days.  Will encourage patient to use sinus irrigation to help with her symptoms and to follow-up with her primary care provider for worsening of the symptoms.  Respiratory triplex panel is negative for Covid and flu.  Final Clinical Impressions(s) / UC Diagnoses   Final diagnoses:  Upper respiratory tract infection, unspecified type  Non-recurrent acute suppurative otitis media of left ear without spontaneous rupture of tympanic membrane     Discharge Instructions     Take the Augmentin twice daily with food for 10 days.  Perform sinus irrigation with a [employer] sinus rinse kit and distilled water twice daily to help with nasal congestion and removal of his from your sinuses.  Use Tylenol and ibuprofen as needed for fever and pain.  You can also put a hot water bottle or heating pad underneath your pillowcase at night to help dilate up your left eustachian tube and help your middle ear drain.  This should help with your dizziness.  If your symptoms continue or worsen return for reevaluation or follow-up with your primary care provider.    ED Prescriptions    Medication Sig Dispense Auth. Provider   amoxicillin-clavulanate (AUGMENTIN) 875-125 MG tablet Take 1 tablet by mouth every 12 (twelve) hours for 10 days. 20 tablet Margarette Canada, NP     PDMP not reviewed this encounter.   Margarette Canada, NP 11/17/20 1051

## 2020-11-17 NOTE — ED Triage Notes (Signed)
Patient c/o sinus congestion and drainage, headache and left ear pain that started 4 days ago.  Patient denies fevers.

## 2020-11-17 NOTE — Discharge Instructions (Addendum)
Take the Augmentin twice daily with food for 10 days.  Perform sinus irrigation with a [employer]Med sinus rinse kit and distilled water twice daily to help with nasal congestion and removal of his from your sinuses.  Use Tylenol and ibuprofen as needed for fever and pain.  You can also put a hot water bottle or heating pad underneath your pillowcase at night to help dilate up your left eustachian tube and help your middle ear drain.  This should help with your dizziness.  If your symptoms continue or worsen return for reevaluation or follow-up with your primary care provider.

## 2020-11-23 ENCOUNTER — Encounter: Payer: Self-pay | Admitting: Student in an Organized Health Care Education/Training Program

## 2020-12-04 ENCOUNTER — Other Ambulatory Visit: Payer: Self-pay

## 2020-12-04 ENCOUNTER — Telehealth (INDEPENDENT_AMBULATORY_CARE_PROVIDER_SITE_OTHER): Payer: No Typology Code available for payment source | Admitting: Family Medicine

## 2020-12-04 ENCOUNTER — Encounter: Payer: Self-pay | Admitting: Family Medicine

## 2020-12-04 DIAGNOSIS — J019 Acute sinusitis, unspecified: Secondary | ICD-10-CM | POA: Diagnosis not present

## 2020-12-04 MED ORDER — PREDNISONE 50 MG PO TABS: ORAL_TABLET | ORAL | 0 refills | Status: DC

## 2020-12-04 MED ORDER — DOXYCYCLINE HYCLATE 100 MG PO TABS: 100.0000 mg | ORAL_TABLET | Freq: Two times a day (BID) | ORAL | 0 refills | Status: DC

## 2020-12-04 MED ORDER — GUAIFENESIN ER 600 MG PO TB12
1200.0000 mg | ORAL_TABLET | Freq: Two times a day (BID) | ORAL | 0 refills | Status: AC
Start: 2020-12-04 — End: 2020-12-14

## 2020-12-04 MED ORDER — AZELASTINE HCL 0.1 % NA SOLN: 2.0000 | Freq: Two times a day (BID) | NASAL | 12 refills | Status: DC

## 2020-12-04 NOTE — Progress Notes (Signed)
Virtual Visit via MyChart Video Visit  The purpose of this virtual visit is to provide medical care while limiting exposure to the novel coronavirus (COVID19) for both patient and office staff.  Consent was obtained for phone visit:  Yes.   Answered questions that patient had about telehealth interaction:  Yes.   I discussed the limitations, risks, security and privacy concerns of performing an evaluation and management service by telephone. I also discussed with the patient that there may be a patient responsible charge related to this service. The patient expressed understanding and agreed to proceed.  Patient is at home and is accessed via [place] are provided by Harlin Rain, FNP-C from [facility])  ---------------------------------------------------------------------- Chief Complaint  Patient presents with  . URI    Pt was seen at the Urgent Care x 2 weeks ago and was diagnose with a URI, ear infection and sinus infection. She was treated w/ Augmentin and notice improvement, but as soon as she stop taking the abx the symptoms returned.  Sinus headache, pressure behind the eyes, productive cough w/ greenish phlegm, sore throat and bilateral ear pain. Pt had a negative COVID & influenza x 2 weeks ago     S: Reviewed CMA documentation. I have called patient and gathered additional HPI as follows:  Ms. Narvaiz presents for MyChart Video Visit for concerns of having sinus pressure headache, with pressure behind her eyes, productive cough with greenish phlegm, sore throat and bilateral ear pain.  Reports symptoms started approximately early December 2021, went to Urgent Care and was diagnosed with an ear and sinus infection, treated with Augmentin, reports had gotten better and once she stopped the antibiotics it got worse again.  Reports having been tested for COVID and influenza, both which were negative.  Has not found anything that has made  her symptoms better.  Denies fevers, sore throat, change in taste/smell, SOB, CP, abdominal pain, n/v/d.  Patient is currently home Denies any high risk travel to areas of current concern for COVID19. Denies any known or suspected exposure to person with or possibly with COVID19.  Past Medical History:  Diagnosis Date  . Allergy   . Anxiety   . Arthritis    in neck, hands  . Cervical dystonia    bone spurs in neck  . Complication of anesthesia    versed allergy  . Depression   . Fibromyalgia    muscle pain  . Fibromyalgia   . GERD (gastroesophageal reflux disease)    Hx of, no issues at this point  . Glaucoma    closed angle glaucoma both eyes, laser treatment in past  . Goiter    thyroid  . Headache    sinus headaches, inner ear imbalance, light sensitivity  . History of kidney stones   . HLD (hyperlipidemia)    statin intolerance  . Hypertension    controlled on meds  . 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 Plantar fasciitis   . Sleep apnea    used CPAP, lost 60 pounds no issue now  . Tinnitus    wears hearing aids  . Varicose veins of both legs with edema   . Vertigo    inner ear imbalance   Social History   Tobacco Use  . Smoking status: Never Smoker  . Smokeless tobacco: Never Used  Vaping Use  . Vaping Use: Never used  Substance Use Topics  . Alcohol use:  Not Currently    Comment: socially  . Drug use: No    Current Outpatient Medications:  .  albuterol (PROAIR HFA) 108 (90 Base) MCG/ACT inhaler, Inhale 1-2 puffs into the lungs every 6 (six) hours as needed for wheezing or shortness of breath., Disp: 8 g, Rfl: 1 .  DULoxetine (CYMBALTA) 60 MG capsule, Take 1 capsule (60 mg total) by mouth daily., Disp: 90 capsule, Rfl: 1 .  hydrochlorothiazide (HYDRODIURIL) 12.5 MG tablet, Take 1 tablet (12.5 mg total) by mouth daily., Disp: 90 tablet, Rfl: 1 .  lisinopril (ZESTRIL) 10 MG tablet, TAKE 1 TABLET BY MOUTH EVERY  DAY, Disp: 90 tablet, Rfl: 0 .  meclizine (ANTIVERT) 12.5 MG tablet, Take 12.5 mg by mouth 3 (three) times daily as needed., Disp: , Rfl:  .  montelukast (SINGULAIR) 10 MG tablet, Take 1 tablet (10 mg total) by mouth at bedtime., Disp: 30 tablet, Rfl: 3 .  nortriptyline (PAMELOR) 25 MG capsule, Take 2 capsules (50 mg total) by mouth at bedtime., Disp: 180 capsule, Rfl: 1 .  azelastine (ASTELIN) 0.1 % nasal spray, Place 2 sprays into both nostrils 2 (two) times daily. Use in each nostril as directed, Disp: 30 mL, Rfl: 12 .  doxycycline (VIBRA-TABS) 100 MG tablet, Take 1 tablet (100 mg total) by mouth 2 (two) times daily., Disp: 20 tablet, Rfl: 0 .  guaiFENesin (MUCINEX) 600 MG 12 hr tablet, Take 2 tablets (1,200 mg total) by mouth 2 (two) times daily for 10 days., Disp: 40 tablet, Rfl: 0 .  predniSONE (DELTASONE) 50 MG tablet, Take 1 tablet daily x 5 days, Disp: 5 tablet, Rfl: 0  Depression screen [facility] 2/9 08/24/2020 05/24/2020 03/20/2020  Decreased Interest 0 0 0  Down, Depressed, Hopeless 0 1 1  PHQ - 2 Score 0 1 1  Altered sleeping - 1 0  Tired, decreased energy - 3 3  Change in appetite - 1 2  Feeling bad or failure about yourself  - 0 0  Trouble concentrating - 1 2  Moving slowly or fidgety/restless - 2 2  Suicidal thoughts - 0 0  PHQ-9 Score - 9 10  Difficult doing work/chores - Somewhat difficult Somewhat difficult    GAD 7 : Generalized Anxiety Score 05/24/2020 03/20/2020 07/16/2019 12/29/2017  Nervous, Anxious, on Edge 1 3 2 2   Control/stop worrying 1 1 1 2   Worry too much - different things 1 1 1 1   Trouble relaxing 2 1 1 1   Restless 1 3 0 1  Easily annoyed or irritable 3 3 1 1   Afraid - awful might happen 1 2 1 1   Total GAD 7 Score 10 14 7 9   Anxiety Difficulty Somewhat difficult Very difficult Somewhat difficult Somewhat difficult    -------------------------------------------------------------------------- O: No physical exam performed due to remote telephone  encounter.  Physical Exam: Patient remotely monitored with video.  Verbal communication appropriate.  Cognition normal.  Recent Results (from the past 2160 hour(s))  Resp Panel by RT-PCR (Flu A&B, Covid) Nasopharyngeal Swab     Status: None   Collection Time: 11/17/20 10:03 AM   Specimen: Nasopharyngeal Swab; Nasopharyngeal(NP) swabs in vial transport medium  Result Value Ref Range   SARS Coronavirus 2 by RT PCR NEGATIVE NEGATIVE    Comment: (NOTE) SARS-CoV-2 target nucleic acids are NOT DETECTED.  The SARS-CoV-2 RNA is generally detectable in upper respiratory specimens during the acute phase of infection. The lowest concentration of SARS-CoV-2 viral copies this assay can detect is 138 copies/mL. A negative  result does not preclude SARS-Cov-2 infection and should not be used as the sole basis for treatment or other patient management decisions. A negative result may occur with  improper specimen collection/handling, submission of specimen other than nasopharyngeal swab, presence of viral mutation(s) within the areas targeted by this assay, and inadequate number of viral copies(<138 copies/mL). A negative result must be combined with clinical observations, patient history, and epidemiological information. The expected result is Negative.  Fact Sheet for Patients:  EntrepreneurPulse.com.au  Fact Sheet for Healthcare Providers:  IncredibleEmployment.be  This test is no t yet approved or cleared by the [place] FDA and  has been authorized for detection and/or diagnosis of SARS-CoV-2 by FDA under an Emergency Use Authorization (EUA). This EUA will remain  in effect (meaning this test can be used) for the duration of the COVID-19 declaration under Section 564(b)(1) of the Act, 21 U.S.C.section 360bbb-3(b)(1), unless the authorization is terminated  or revoked sooner.       Influenza A by PCR NEGATIVE NEGATIVE   Influenza B by PCR  NEGATIVE NEGATIVE    Comment: (NOTE) The Xpert Xpress SARS-CoV-2/FLU/RSV plus assay is intended as an aid in the diagnosis of influenza from Nasopharyngeal swab specimens and should not be used as a sole basis for treatment. Nasal washings and aspirates are unacceptable for Xpert Xpress SARS-CoV-2/FLU/RSV testing.  Fact Sheet for Patients: EntrepreneurPulse.com.au  Fact Sheet for Healthcare Providers: IncredibleEmployment.be  This test is not yet approved or cleared by the [place] FDA and has been authorized for detection and/or diagnosis of SARS-CoV-2 by FDA under an Emergency Use Authorization (EUA). This EUA will remain in effect (meaning this test can be used) for the duration of the COVID-19 declaration under Section 564(b)(1) of the Act, 21 U.S.C. section 360bbb-3(b)(1), unless the authorization is terminated or revoked.  Performed at [facility], [address] [place], [place] [address]     -------------------------------------------------------------------------- A&P:  Problem List Items Addressed This Visit      Respiratory   Acute non-recurrent sinusitis - Primary    Treating as sinus infection based on symptoms, discussed treating with doxycycline, prednisone, nasal spray and mucinex.    Plan: 1. Begin doxycycline 100mg  BID x 10 days 2. Begin prednisone 50mg  daily x 5 days 3. Begin azelastine nasal spray, as directed 4. Begin mucinex 1200mg  BID x 10 days 5. RTC if symptoms worsen or fail to improve      Relevant Medications   doxycycline (VIBRA-TABS) 100 MG tablet   predniSONE (DELTASONE) 50 MG tablet   azelastine (ASTELIN) 0.1 % nasal spray   guaiFENesin (MUCINEX) 600 MG 12 hr tablet      Meds ordered this encounter  Medications  . doxycycline (VIBRA-TABS) 100 MG tablet    Sig: Take 1 tablet (100 mg total) by mouth 2 (two) times daily.    Dispense:  20 tablet    Refill:  0  . predniSONE  (DELTASONE) 50 MG tablet    Sig: Take 1 tablet daily x 5 days    Dispense:  5 tablet    Refill:  0  . azelastine (ASTELIN) 0.1 % nasal spray    Sig: Place 2 sprays into both nostrils 2 (two) times daily. Use in each nostril as directed    Dispense:  30 mL    Refill:  12  . guaiFENesin (MUCINEX) 600 MG 12 hr tablet    Sig: Take 2 tablets (1,200 mg total) by mouth 2 (two) times daily for 10  days.    Dispense:  40 tablet    Refill:  0    Follow-up: - Return if symptoms worsen or fail to improve  Patient verbalizes understanding with the above medical recommendations including the limitation of remote medical advice.  Specific follow-up and call-back criteria were given for patient to follow-up or seek medical care more urgently if needed.  - Time spent in direct consultation with patient on video: 7 minutes  Harlin Rain, [facility] 12/04/2020, 2:45 PM

## 2020-12-04 NOTE — Assessment & Plan Note (Signed)
Treating as sinus infection based on symptoms, discussed treating with doxycycline, prednisone, nasal spray and mucinex.    Plan: 1. Begin doxycycline 100mg  BID x 10 days 2. Begin prednisone 50mg  daily x 5 days 3. Begin azelastine nasal spray, as directed 4. Begin mucinex 1200mg  BID x 10 days 5. RTC if symptoms worsen or fail to improve

## 2020-12-10 ENCOUNTER — Other Ambulatory Visit: Payer: Self-pay | Admitting: Family Medicine

## 2020-12-10 DIAGNOSIS — I10 Essential (primary) hypertension: Secondary | ICD-10-CM

## 2020-12-10 NOTE — Telephone Encounter (Signed)
Requested Prescriptions  Pending Prescriptions Disp Refills  . lisinopril (ZESTRIL) 10 MG tablet [Pharmacy Med Name: LISINOPRIL 10 MG TABLET] 90 tablet 0    Sig: TAKE 1 TABLET BY MOUTH EVERY DAY     Cardiovascular:  ACE Inhibitors Failed - 12/10/2020  8:31 AM      Failed - Cr in normal range and within 180 days    Creat  Date Value Ref Range Status  04/27/2020 0.74 0.50 - 0.99 mg/dL Final    Comment:    For patients >61 years of age, the reference limit for Creatinine is approximately 13% higher for people identified as African-American. .          Failed - K in normal range and within 180 days    Potassium  Date Value Ref Range Status  04/27/2020 5.4 (H) 3.5 - 5.3 mmol/L Final         Passed - Patient is not pregnant      Passed - Last BP in normal range    BP Readings from Last 1 Encounters:  11/17/20 132/86         Passed - Valid encounter within last 6 months    Recent Outpatient Visits          6 days ago Acute non-recurrent sinusitis, unspecified location   [place]ardo, FNP   2 months ago Shortness of breath   [place], FNP   3 months ago [facility], Lupita Raider, FNP   6 months ago Toenail fungus   [facility], Lupita Raider, FNP   7 months ago Essential hypertension   [facility], Lupita Raider, FNP      Future Appointments            In 1 week [facility], Lupita Raider, [facility], [facility]

## 2020-12-22 ENCOUNTER — Other Ambulatory Visit: Payer: Self-pay

## 2020-12-22 ENCOUNTER — Ambulatory Visit (INDEPENDENT_AMBULATORY_CARE_PROVIDER_SITE_OTHER): Payer: No Typology Code available for payment source | Admitting: Family Medicine

## 2020-12-22 ENCOUNTER — Encounter: Payer: Self-pay | Admitting: Family Medicine

## 2020-12-22 VITALS — BP 126/76 | HR 90 | Temp 97.3°F | Ht 65.5 in | Wt 187.0 lb

## 2020-12-22 DIAGNOSIS — R0602 Shortness of breath: Secondary | ICD-10-CM

## 2020-12-22 DIAGNOSIS — R0981 Nasal congestion: Secondary | ICD-10-CM | POA: Insufficient documentation

## 2020-12-22 DIAGNOSIS — R635 Abnormal weight gain: Secondary | ICD-10-CM | POA: Diagnosis not present

## 2020-12-22 DIAGNOSIS — I10 Essential (primary) hypertension: Secondary | ICD-10-CM | POA: Diagnosis not present

## 2020-12-22 LAB — COMPLETE METABOLIC PANEL WITH GFR
AG Ratio: 1.6 (calc) (ref 1.0–2.5)
ALT: 15 U/L (ref 6–29)
AST: 15 U/L (ref 10–35)
Albumin: 4.2 g/dL (ref 3.6–5.1)
Alkaline phosphatase (APISO): 97 U/L (ref 37–153)
BUN: 18 mg/dL (ref 7–25)
CO2: 28 mmol/L (ref 20–32)
Calcium: 9.5 mg/dL (ref 8.6–10.4)
Chloride: 102 mmol/L (ref 98–110)
Creat: 0.86 mg/dL (ref 0.50–0.99)
GFR, Est African American: 85 mL/min/{1.73_m2} (ref >=60)
GFR, Est Non African American: 73 mL/min/{1.73_m2} (ref >=60)
Globulin: 2.6 g/dL (calc) (ref 1.9–3.7)
Glucose, Bld: 74 mg/dL (ref 65–99)
Potassium: 4.5 mmol/L (ref 3.5–5.3)
Sodium: 138 mmol/L (ref 135–146)
Total Bilirubin: 0.3 mg/dL (ref 0.2–1.2)
Total Protein: 6.8 g/dL (ref 6.1–8.1)

## 2020-12-22 MED ORDER — MONTELUKAST SODIUM 10 MG PO TABS: 10.0000 mg | ORAL_TABLET | Freq: Every day | ORAL | 3 refills | Status: DC

## 2020-12-22 NOTE — Progress Notes (Signed)
Subjective:    Patient ID: Denise Elliott, female    DOB: 08-03-1961, 60 y.o.   MRN: [id number]  Denise Elliott is a 60 y.o. female presenting on 12/22/2020 for Hypertension and Sinus Problem   HPI  Denise Elliott presents to clinic for a follow up on her hypertension and for concerns of continued sinus congestion for > 1 year.  Reports Denise Elliott has been having post nasal drainage, night time cough, sore throat, left ear discomfort and sinus pressure for over a year.  Was recently treated with antibiotics and prednisone with some mild improvement but no resolution of symptoms.  Has tried azelastine, nasocort, flonase and singulair for her symptoms.  Has had some control with singulair but has recently run out of this prescription.  Hypertension - Denise Elliott is not checking BP at home or outside of clinic.    - Current medications: lisinopril 20mg  and hydrochlorothiazide 12.mg daily, tolerating well without side effects - Denise Elliott is not currently symptomatic. - Pt denies headache, lightheadedness, dizziness, changes in vision, chest tightness/pressure, palpitations, leg swelling, sudden loss of speech or loss of consciousness. - Denise Elliott  reports no regular exercise routine. - Her diet is moderate in salt, moderate in fat, and moderate in carbohydrates.  Depression screen [facility] 2/9 08/24/2020 05/24/2020 03/20/2020  Decreased Interest 0 0 0  Down, Depressed, Hopeless 0 1 1  PHQ - 2 Score 0 1 1  Altered sleeping - 1 0  Tired, decreased energy - 3 3  Change in appetite - 1 2  Feeling bad or failure about yourself  - 0 0  Trouble concentrating - 1 2  Moving slowly or fidgety/restless - 2 2  Suicidal thoughts - 0 0  PHQ-9 Score - 9 10  Difficult doing work/chores - Somewhat difficult Somewhat difficult    Social History   Tobacco Use  . Smoking status: Never Smoker  . Smokeless tobacco: Never Used  Vaping Use  . Vaping Use: Never used  Substance Use Topics  . Alcohol use: Yes    Comment: socially  . Drug use:  No    Review of Systems  Constitutional: Negative.   HENT: Positive for congestion, ear pain, postnasal drip, rhinorrhea, sinus pressure and sore throat. Negative for dental problem, drooling, ear discharge, facial swelling, hearing loss, mouth sores, nosebleeds, sinus pain, sneezing, tinnitus, trouble swallowing and voice change.   Eyes: Negative.   Respiratory: Positive for cough. Negative for apnea, choking, chest tightness, shortness of breath, wheezing and stridor.   Cardiovascular: Negative.   Gastrointestinal: Negative.   Endocrine: Negative.   Genitourinary: Negative.   Musculoskeletal: Negative.   Skin: Negative.   Allergic/Immunologic: Negative.   Neurological: Negative.   Hematological: Negative.   Psychiatric/Behavioral: Negative.    Per HPI unless specifically indicated above     Objective:    BP 126/76 (BP Location: Right Arm, Patient Position: Sitting, Cuff Size: Normal)   Pulse 90   Temp (!) 97.3 F (36.3 C) (Temporal)   Ht 5' 5.5" (1.664 m)   Wt 187 lb (84.8 kg)   LMP 02/03/2020   BMI 30.65 kg/m   Wt Readings from Last 3 Encounters:  12/22/20 187 lb (84.8 kg)  11/17/20 170 lb (77.1 kg)  09/21/20 161 lb 9.6 oz (73.3 kg)    Physical Exam Vitals and nursing note reviewed.  Constitutional:      General: Denise Elliott is not in acute distress.    Appearance: Normal appearance. Denise Elliott is well-developed and well-groomed. Denise Elliott is obese.  Denise Elliott is not ill-appearing or toxic-appearing.  HENT:     Head: Normocephalic and atraumatic.     Nose:     Comments: Lizbeth Bark is in place, covering mouth and nose. Eyes:     General: Lids are normal. Vision grossly intact.        Right eye: No discharge.        Left eye: No discharge.     Extraocular Movements: Extraocular movements intact.     Conjunctiva/sclera: Conjunctivae normal.     Pupils: Pupils are equal, round, and reactive to light.  Cardiovascular:     Rate and Rhythm: Normal rate and regular rhythm.     Pulses: Normal  pulses.     Heart sounds: Normal heart sounds. No murmur heard. No friction rub. No gallop.   Pulmonary:     Effort: Pulmonary effort is normal. No respiratory distress.     Breath sounds: Normal breath sounds.  Skin:    General: Skin is warm and dry.     Capillary Refill: Capillary refill takes less than 2 seconds.  Neurological:     General: No focal deficit present.     Mental Status: Denise Elliott is alert and oriented to person, place, and time.  Psychiatric:        Attention and Perception: Attention and perception normal.        Mood and Affect: Mood and affect normal.        Speech: Speech normal.        Behavior: Behavior normal. Behavior is cooperative.        Thought Content: Thought content normal.        Cognition and Memory: Cognition and memory normal.        Judgment: Judgment normal.    Results for orders placed or performed during the hospital encounter of 11/17/20  Resp Panel by RT-PCR (Flu A&B, Covid) Nasopharyngeal Swab   Specimen: Nasopharyngeal Swab; Nasopharyngeal(NP) swabs in vial transport medium  Result Value Ref Range   SARS Coronavirus 2 by RT PCR NEGATIVE NEGATIVE   Influenza A by PCR NEGATIVE NEGATIVE   Influenza B by PCR NEGATIVE NEGATIVE      Assessment & Plan:   Problem List Items Addressed This Visit      Cardiovascular and Mediastinum   Hypertension    Controlled hypertension.  BP is at goal < 130/80.  Pt reports working on lifestyle modifications.  Taking medications tolerating well without side effects.  Complications:  Obesity  Plan: 1. Continue taking lisinopril 10mg  and hydrochlorothiazide 12.5mg  daily 2. Obtain labs today  3. Encouraged heart healthy diet and increasing exercise to 30 minutes most days of the week, going no more than 2 days in a row without exercise. 4. Check BP 1-2 x per week at home, keep log, and bring to clinic at next appointment. 5. Follow up 6 months.       Relevant Orders   COMPLETE METABOLIC PANEL WITH GFR      Respiratory   Sinus congestion - Primary    Reports has been present for > 1 year, has just recently ran out of her singulair and requesting refill.  Has tried flonase, nasocort, and azelastine without improvement in her sinus symptoms.  Will refer to ENT for further evaluation.      Relevant Orders   Ambulatory referral to ENT     Other   Shortness of breath    Likely secondary to weight gain and s/p COVID.  Discussed referral to pulmonology  for PFTs and evaluation, patient requesting to wait until after meeting with ENT for further testing/referral.        Relevant Medications   montelukast (SINGULAIR) 10 MG tablet   Weight gain    67lb weight gain since 03/2020.  Has continued on duloxetine and nortriptyline with continued weight gain, although much improvement in anxiety/depression and fibromyalgia symptoms.  Patient to work on increasing exercise tolerance and dietary/lifestyle modifications to help with weight reduction.         Meds ordered this encounter  Medications  . montelukast (SINGULAIR) 10 MG tablet    Sig: Take 1 tablet (10 mg total) by mouth at bedtime.    Dispense:  90 tablet    Refill:  3   Follow up plan: Return in about 6 months (around 06/21/2021) for HTN F/U.   Harlin Rain, [place] [facility] Nurse Practitioner [facility] 12/22/2020, 8:55 AM

## 2020-12-22 NOTE — Assessment & Plan Note (Signed)
Likely secondary to weight gain and s/p COVID.  Discussed referral to pulmonology for PFTs and evaluation, patient requesting to wait until after meeting with ENT for further testing/referral.

## 2020-12-22 NOTE — Assessment & Plan Note (Signed)
Reports has been present for > 1 year, has just recently ran out of her singulair and requesting refill.  Has tried flonase, nasocort, and azelastine without improvement in her sinus symptoms.  Will refer to ENT for further evaluation.

## 2020-12-22 NOTE — Assessment & Plan Note (Signed)
Controlled hypertension.  BP is at goal < 130/80.  Pt reports working on lifestyle modifications.  Taking medications tolerating well without side effects.  Complications:  Obesity  Plan: 1. Continue taking lisinopril 10mg  and hydrochlorothiazide 12.5mg  daily 2. Obtain labs today  3. Encouraged heart healthy diet and increasing exercise to 30 minutes most days of the week, going no more than 2 days in a row without exercise. 4. Check BP 1-2 x per week at home, keep log, and bring to clinic at next appointment. 5. Follow up 6 months.

## 2020-12-22 NOTE — Patient Instructions (Signed)
Have your labs drawn and we will contact you with the results  Continue all medications as prescribed  Try to get exercise a minimum of 30 minutes per day at least 5 days per week as well as  adequate water intake all while measuring blood pressure a few times per week.  Keep a blood pressure log and bring back to clinic at your next visit.  If your readings are consistently over 130/80 to contact our office/send me a MyChart message and we will see you sooner.  Can try DASH and Mediterranean diet options, avoiding processed foods, lowering sodium intake, avoiding pork products, and eating a plant based diet for optimal health.  A referral to ENT for chronic sinus problems for over a year has been placed today.  If you have not heard from the specialty office or our referral coordinator within 1 week, please let us know and we will follow up with the referral coordinator for an update.  Please let us know if you'd like the referral to Pulmonology for pulmonary function testing after you meet with ENT  We will plan to see you back in 6 months for hypertension follow up visit  You will receive a survey after today's visit either digitally by e-mail or paper by [place] mail. Your experiences and feedback matter to [place].  Please respond so we know how we are doing as we provide care for you.  Call us with any questions/concerns/needs.  It is my goal to be available to you for your health concerns.  Thanks for choosing me to be a partner in your healthcare needs!  Harlin Rain, FNP-C Family Nurse Practitioner [facility] Phone: [phone]

## 2020-12-22 NOTE — Assessment & Plan Note (Signed)
67lb weight gain since 03/2020.  Has continued on duloxetine and nortriptyline with continued weight gain, although much improvement in anxiety/depression and fibromyalgia symptoms.  Patient to work on increasing exercise tolerance and dietary/lifestyle modifications to help with weight reduction.

## 2021-01-01 ENCOUNTER — Ambulatory Visit: Payer: Self-pay

## 2021-01-01 NOTE — Telephone Encounter (Signed)
Pt c/o quarter sized dark red drainage on the pillow. Pt stated that it is from her left ear. No longer bleeding. Pt does use Q tips.  Pt has previous issues with ears and was refferd to ENT. Hearing is muffled to left ear. Pt has h/o tinnitus. Pt also with sinus congestion with mucus with red streaks.  Due to ear and sinus issue, virtual visit was assigned.  Pt would like an in office visit to examine ear drums. Currently has virtual appt 01/04/21.   Reason for Disposition . Unexplained bleeding from ear  Answer Assessment - Initial Assessment Questions 1. LOCATION: "Which ear is involved?"      Left ear bleeding - both ears hurt but L> R 2. COLOR: "What is the color of the discharge?"      Dark red 3. CONSISTENCY: "How runny is the discharge? Could it be water?"      siaked size bigger of quarter  4. ONSET: "When did you first notice the discharge?"     yesterday 5. PAIN: "Is there any earache?" "How bad is it?"  (Scale 1-10; or mild, moderate, severe)     Yes to bother ears left > R- severe 6. OBJECTS: "Have you put anything in your ear?" (e.g., Q-tip, other object)      Q tip after shower 7. OTHER SYMPTOMS: "Do you have any other symptoms?" (e.g., headache, fever, dizziness, vomiting, runny nose)     [place] hearing left ear, sinus congestion, blood streaks and clots from both nostrils 8. PREGNANCY: "Is there any chance you are pregnant?" "When was your last menstrual period?"     n/a  Protocols used: EAR - DISCHARGE-A-AH

## 2021-01-01 NOTE — Telephone Encounter (Signed)
This encounter was created in error - please disregard.

## 2021-01-02 ENCOUNTER — Ambulatory Visit (INDEPENDENT_AMBULATORY_CARE_PROVIDER_SITE_OTHER): Payer: No Typology Code available for payment source | Admitting: Family Medicine

## 2021-01-02 ENCOUNTER — Encounter: Payer: Self-pay | Admitting: Family Medicine

## 2021-01-02 ENCOUNTER — Other Ambulatory Visit: Payer: Self-pay

## 2021-01-02 VITALS — BP 121/68 | HR 91 | Temp 97.3°F | Resp 17 | Ht 65.5 in | Wt 188.0 lb

## 2021-01-02 DIAGNOSIS — J019 Acute sinusitis, unspecified: Secondary | ICD-10-CM | POA: Diagnosis not present

## 2021-01-02 MED ORDER — AMOXICILLIN-POT CLAVULANATE 875-125 MG PO TABS: 1.0000 | ORAL_TABLET | Freq: Two times a day (BID) | ORAL | 0 refills | Status: DC

## 2021-01-02 NOTE — Progress Notes (Signed)
Subjective:    Patient ID: Denise Elliott, female    DOB: 1961/05/27, 61 y.o.   MRN: [id number]  Denise Elliott is a 60 y.o. female presenting on 01/02/2021 for Ear Pain (Persistent Left ear pain. Pt was treated for an ear infection and sinus infection recently. Pt state over last two days she notice blood on her pillow coming from the ear. Also complains of persistent yellowish sinus drainage mostly in the morning. Frontal lobe pressure  )   HPI  Denise Elliott presents to clinic for concerns of persistent left ear pain with recent ear infection/sinus infection.  Notes over the past 2 days she has had some blood on her pillow case coming down from her ear.  Has been having persistent yellow sinus drainage in the morning with frontal sinus pressure.  Denies improvement in symptoms since last sinus infection diagnosis.  Has been referred to ENT on 12/22/2020 but has not yet been contacted to schedule.  Depression screen [facility] 2/9 08/24/2020 05/24/2020 03/20/2020  Decreased Interest 0 0 0  Down, Depressed, Hopeless 0 1 1  PHQ - 2 Score 0 1 1  Altered sleeping - 1 0  Tired, decreased energy - 3 3  Change in appetite - 1 2  Feeling bad or failure about yourself  - 0 0  Trouble concentrating - 1 2  Moving slowly or fidgety/restless - 2 2  Suicidal thoughts - 0 0  PHQ-9 Score - 9 10  Difficult doing work/chores - Somewhat difficult Somewhat difficult    Social History   Tobacco Use  . Smoking status: Never Smoker  . Smokeless tobacco: Never Used  Vaping Use  . Vaping Use: Never used  Substance Use Topics  . Alcohol use: Yes    Comment: socially  . Drug use: No    Review of Systems  Constitutional: Negative.   HENT: Positive for congestion, ear discharge, ear pain, postnasal drip, rhinorrhea, sinus pressure and sinus pain. Negative for dental problem, drooling, facial swelling, hearing loss, mouth sores, nosebleeds, sneezing, sore throat, tinnitus, trouble swallowing and voice change.    Eyes: Negative.   Respiratory: Negative.   Cardiovascular: Negative.   Gastrointestinal: Negative.   Endocrine: Negative.   Genitourinary: Negative.   Musculoskeletal: Negative.   Skin: Negative.   Allergic/Immunologic: Negative.   Neurological: Negative.   Hematological: Negative.   Psychiatric/Behavioral: Negative.    Per HPI unless specifically indicated above     Objective:    BP 121/68 (BP Location: Left Arm, Patient Position: Sitting, Cuff Size: Normal)   Pulse 91   Temp (!) 97.3 F (36.3 C) (Temporal)   Resp 17   Ht 5' 5.5" (1.664 m)   Wt 188 lb (85.3 kg)   LMP 02/03/2020   SpO2 100%   BMI 30.81 kg/m   Wt Readings from Last 3 Encounters:  01/02/21 188 lb (85.3 kg)  12/22/20 187 lb (84.8 kg)  11/17/20 170 lb (77.1 kg)    Physical Exam Vitals and nursing note reviewed.  Constitutional:      General: She is not in acute distress.    Appearance: Normal appearance. She is well-developed and well-groomed. She is not ill-appearing or toxic-appearing.  HENT:     Head: Normocephalic and atraumatic.     Right Ear: Ear canal and external ear normal. There is no impacted cerumen. Tympanic membrane is bulging.     Left Ear: External ear normal. Drainage present. There is no impacted cerumen. Tympanic membrane is injected, erythematous  and bulging. Tympanic membrane is not scarred or perforated.     Nose:     Right Sinus: Maxillary sinus tenderness and frontal sinus tenderness present.     Left Sinus: Maxillary sinus tenderness and frontal sinus tenderness present.     Comments: Lizbeth Bark is in place, covering mouth and nose. Eyes:     General: Lids are normal. Vision grossly intact.        Right eye: No discharge.        Left eye: No discharge.     Extraocular Movements: Extraocular movements intact.     Conjunctiva/sclera: Conjunctivae normal.     Pupils: Pupils are equal, round, and reactive to light.  Cardiovascular:     Pulses: Normal pulses.  Pulmonary:      Effort: Pulmonary effort is normal. No respiratory distress.  Skin:    General: Skin is warm and dry.     Capillary Refill: Capillary refill takes less than 2 seconds.  Neurological:     General: No focal deficit present.     Mental Status: She is alert and oriented to person, place, and time.  Psychiatric:        Attention and Perception: Attention and perception normal.        Mood and Affect: Mood and affect normal.        Speech: Speech normal.        Behavior: Behavior normal. Behavior is cooperative.        Thought Content: Thought content normal.        Cognition and Memory: Cognition and memory normal.        Judgment: Judgment normal.    Results for orders placed or performed in visit on 12/22/20  COMPLETE METABOLIC PANEL WITH GFR  Result Value Ref Range   Glucose, Bld 74 65 - 99 mg/dL   BUN 18 7 - 25 mg/dL   Creat 0.86 0.50 - 0.99 mg/dL   GFR, Est Non African American 73 > OR = 60 mL/min/1.19m2   GFR, Est African American 85 > OR = 60 mL/min/1.67m2   BUN/Creatinine Ratio NOT APPLICABLE 6 - 22 (calc)   Sodium 138 135 - 146 mmol/L   Potassium 4.5 3.5 - 5.3 mmol/L   Chloride 102 98 - 110 mmol/L   CO2 28 20 - 32 mmol/L   Calcium 9.5 8.6 - 10.4 mg/dL   Total Protein 6.8 6.1 - 8.1 g/dL   Albumin 4.2 3.6 - 5.1 g/dL   Globulin 2.6 1.9 - 3.7 g/dL (calc)   AG Ratio 1.6 1.0 - 2.5 (calc)   Total Bilirubin 0.3 0.2 - 1.2 mg/dL   Alkaline phosphatase (APISO) 97 37 - 153 U/L   AST 15 10 - 35 U/L   ALT 15 6 - 29 U/L      Assessment & Plan:   Problem List Items Addressed This Visit      Respiratory   Acute non-recurrent sinusitis - Primary    Acute sinus infection with left ear infection, no ruptured TM noted on exam, some dried blood in left ear canal.  Will treat with Augmentin 875-125mg  BID x 10 days.  Provided with contact information for [facility] to schedule appointment.  Referral had been sent to [facility] on 12/22/2020.  RTC PRN      Relevant Medications    amoxicillin-clavulanate (AUGMENTIN) 875-125 MG tablet      Meds ordered this encounter  Medications  . amoxicillin-clavulanate (AUGMENTIN) 875-125 MG tablet    Sig: Take  1 tablet by mouth 2 (two) times daily.    Dispense:  20 tablet    Refill:  0   Follow up plan: Return if symptoms worsen or fail to improve.   Harlin Rain, [place] Family Nurse Practitioner [facility] 01/02/2021, 3:13 PM

## 2021-01-02 NOTE — Telephone Encounter (Signed)
Looks like we have a 2:40pm OV in clinic today

## 2021-01-02 NOTE — Assessment & Plan Note (Signed)
Acute sinus infection with left ear infection, no ruptured TM noted on exam, some dried blood in left ear canal.  Will treat with Augmentin 875-125mg  BID x 10 days.  Provided with contact information for [facility] to schedule appointment.  Referral had been sent to [facility] on 12/22/2020.  RTC PRN

## 2021-01-02 NOTE — Patient Instructions (Signed)
Contact  ENT to schedule an appointment  I have sent in a prescription for Augmentin 875-125mg  to take 1 tablet 2x per day for the next 10 days.  Be sure to take with food, as can cause stomach upset  We will plan to see you back if your symptoms worsen or fail to improve  You will receive a survey after today's visit either digitally by e-mail or paper by USPS mail. Your experiences and feedback matter to [place].  Please respond so we know how we are doing as we provide care for you.  Call us with any questions/concerns/needs.  It is my goal to be available to you for your health concerns.  Thanks for choosing me to be a partner in your healthcare needs!  Harlin Rain, FNP-C Family Nurse Practitioner [facility] Phone: [phone]

## 2021-01-04 ENCOUNTER — Telehealth: Payer: Self-pay | Admitting: Family Medicine

## 2021-01-09 ENCOUNTER — Inpatient Hospital Stay: Admission: RE | Admit: 2021-01-09 | Payer: Self-pay | Source: Ambulatory Visit

## 2021-02-28 ENCOUNTER — Ambulatory Visit: Payer: BLUE CROSS/BLUE SHIELD | Attending: Otolaryngology

## 2021-02-28 DIAGNOSIS — G4733 Obstructive sleep apnea (adult) (pediatric): Secondary | ICD-10-CM | POA: Insufficient documentation

## 2021-03-02 ENCOUNTER — Other Ambulatory Visit: Payer: Self-pay

## 2021-03-29 ENCOUNTER — Other Ambulatory Visit: Payer: Self-pay | Admitting: Family Medicine

## 2021-03-29 DIAGNOSIS — I10 Essential (primary) hypertension: Secondary | ICD-10-CM

## 2021-03-29 DIAGNOSIS — K219 Gastro-esophageal reflux disease without esophagitis: Secondary | ICD-10-CM

## 2021-03-29 MED ORDER — HYDROCHLOROTHIAZIDE 12.5 MG PO TABS: 12.5000 mg | ORAL_TABLET | Freq: Every day | ORAL | 0 refills | Status: DC

## 2021-03-29 MED ORDER — OMEPRAZOLE 10 MG PO CPDR: 10.0000 mg | DELAYED_RELEASE_CAPSULE | Freq: Every day | ORAL | 0 refills | Status: DC

## 2021-03-29 MED ORDER — LISINOPRIL 10 MG PO TABS: 1.0000 | ORAL_TABLET | Freq: Every day | ORAL | 0 refills | Status: DC

## 2021-03-29 NOTE — Telephone Encounter (Signed)
Copied from [place] [phone]. Topic: Quick Communication - Rx Refill/Question >> Mar 29, 2021 11:35 AM Tessa Lerner A wrote: Medication: hydrochlorothiazide (HYDRODIURIL) 12.5 MG tablet  lisinopril (ZESTRIL) 10 MG tablet  omeprazole (PRILOSEC) 20 MG capsule   Has the patient contacted their pharmacy? Yes. Patient has contacted their pharmacy three times for the prescriptions and been directed to contact their PCP  Preferred Pharmacy (with phone number or street name): [id number] - [place], [address]  Phone:  [phone] Fax:  [phone]  Agent: Please be advised that RX refills may take up to 3 business days. We ask that you follow-up with your pharmacy.

## 2021-03-29 NOTE — Telephone Encounter (Signed)
Notes to clinic:Medication were last filled by Cyndia Skeeters Patient hasn't seen another provider yet Patient needs refill asap    Requested Prescriptions  Pending Prescriptions Disp Refills   hydrochlorothiazide (HYDRODIURIL) 12.5 MG tablet 90 tablet 1    Sig: Take 1 tablet (12.5 mg total) by mouth daily.      Cardiovascular: Diuretics - Thiazide Passed - 03/29/2021 11:43 AM      Passed - Ca in normal range and within 360 days    Calcium  Date Value Ref Range Status  12/22/2020 9.5 8.6 - 10.4 mg/dL Final          Passed - Cr in normal range and within 360 days    Creat  Date Value Ref Range Status  12/22/2020 0.86 0.50 - 0.99 mg/dL Final    Comment:    For patients >26 years of age, the reference limit for Creatinine is approximately 13% higher for people identified as African-American. .           Passed - K in normal range and within 360 days    Potassium  Date Value Ref Range Status  12/22/2020 4.5 3.5 - 5.3 mmol/L Final          Passed - Na in normal range and within 360 days    Sodium  Date Value Ref Range Status  12/22/2020 138 135 - 146 mmol/L Final  07/16/2019 140 134 - 144 mmol/L Final          Passed - Last BP in normal range    BP Readings from Last 1 Encounters:  01/02/21 121/68          Passed - Valid encounter within last 6 months    Recent Outpatient Visits           2 months ago Acute non-recurrent sinusitis, unspecified location   [place], FNP   3 months ago Sinus congestion   [facility], Lupita Raider, FNP   3 months ago Acute non-recurrent sinusitis, unspecified location   [place], FNP   6 months ago Shortness of breath   [place], FNP   7 months ago [facility] [place], Lupita Raider, [place]                  lisinopril (ZESTRIL) 10 MG tablet 90 tablet 0    Sig: Take 1 tablet  (10 mg total) by mouth daily.      Cardiovascular:  ACE Inhibitors Passed - 03/29/2021 11:43 AM      Passed - Cr in normal range and within 180 days    Creat  Date Value Ref Range Status  12/22/2020 0.86 0.50 - 0.99 mg/dL Final    Comment:    For patients >24 years of age, the reference limit for Creatinine is approximately 13% higher for people identified as African-American. .           Passed - K in normal range and within 180 days    Potassium  Date Value Ref Range Status  12/22/2020 4.5 3.5 - 5.3 mmol/L Final          Passed - Patient is not pregnant      Passed - Last BP in normal range    BP Readings from Last 1 Encounters:  01/02/21 121/68          Passed -  Valid encounter within last 6 months    Recent Outpatient Visits           2 months ago Acute non-recurrent sinusitis, unspecified location   [place], FNP   3 months ago Sinus congestion   [facility], Lupita Raider, FNP   3 months ago Acute non-recurrent sinusitis, unspecified location   [facility], Lupita Raider, FNP   6 months ago Shortness of breath   [place], FNP   7 months ago [facility] [place], Lupita Raider, FNP                  omeprazole (PRILOSEC) 10 MG capsule      Sig: Take 1 capsule (10 mg total) by mouth daily.      Gastroenterology: Proton Pump Inhibitors Passed - 03/29/2021 11:43 AM      Passed - Valid encounter within last 12 months    Recent Outpatient Visits           2 months ago Acute non-recurrent sinusitis, unspecified location   [place], FNP   3 months ago Sinus congestion   [facility], Lupita Raider, FNP   3 months ago Acute non-recurrent sinusitis, unspecified location   [place], FNP   6 months ago Shortness of breath   [facility], Lupita Raider, FNP   7 months ago [facility] [place], Lupita Raider, [place]

## 2021-04-15 ENCOUNTER — Other Ambulatory Visit: Payer: Self-pay | Admitting: Family Medicine

## 2021-04-15 DIAGNOSIS — I10 Essential (primary) hypertension: Secondary | ICD-10-CM

## 2021-04-16 ENCOUNTER — Telehealth: Payer: Self-pay | Admitting: Family Medicine

## 2021-04-16 NOTE — Telephone Encounter (Signed)
Copied CRM  Denise Elliott (Patient) Denise Elliott (Patient) Referral - Request for Referral  Has patient seen PCP for this complaint? No    *If NO, is insurance requiring patient see PCP for this issue before PCP can refer them?    Referral for which specialty: Radiology / Imaging     Preferred provider/office: [facility]    Reason for referral: Patient would like to have a mammogram

## 2021-04-19 ENCOUNTER — Other Ambulatory Visit: Payer: Self-pay | Admitting: Family Medicine

## 2021-04-19 DIAGNOSIS — Z1231 Encounter for screening mammogram for malignant neoplasm of breast: Secondary | ICD-10-CM

## 2021-04-19 NOTE — Telephone Encounter (Signed)
Call to office- verified that order should still be valid from December- office stated yes.  Call to patient- patient states she called to make appointment and that since her provider had left the practice at [facility]- they would need new order from active provider in the office. They need a provider to send the result to. Can this order please be updated so patient may schedule her MM?

## 2021-04-19 NOTE — Telephone Encounter (Signed)
Pt is calling calling to check on the status of the below request. Pt is willing to make an appt if needed. Cb- [phone]

## 2021-04-26 ENCOUNTER — Inpatient Hospital Stay: Admission: RE | Admit: 2021-04-26 | Payer: Self-pay | Source: Ambulatory Visit

## 2021-04-30 ENCOUNTER — Ambulatory Visit
Admission: RE | Admit: 2021-04-30 | Discharge: 2021-04-30 | Disposition: A | Discharge status: Home / Self Care | Payer: BLUE CROSS/BLUE SHIELD | Source: Ambulatory Visit | Attending: Family Medicine | Admitting: Family Medicine

## 2021-04-30 ENCOUNTER — Other Ambulatory Visit: Payer: Self-pay

## 2021-04-30 DIAGNOSIS — Z1231 Encounter for screening mammogram for malignant neoplasm of breast: Secondary | ICD-10-CM | POA: Diagnosis present

## 2021-05-22 ENCOUNTER — Other Ambulatory Visit: Payer: Self-pay | Admitting: Family Medicine

## 2021-05-22 DIAGNOSIS — I10 Essential (primary) hypertension: Secondary | ICD-10-CM

## 2021-05-22 DIAGNOSIS — K219 Gastro-esophageal reflux disease without esophagitis: Secondary | ICD-10-CM

## 2021-05-22 NOTE — Telephone Encounter (Signed)
Requested Prescriptions  Pending Prescriptions Disp Refills  . hydrochlorothiazide (HYDRODIURIL) 12.5 MG tablet [Pharmacy Med Name: HYDROCHLOROTHIAZIDE 12.5 MG TB] 90 tablet 0    Sig: TAKE 1 TABLET BY MOUTH EVERY DAY     Cardiovascular: Diuretics - Thiazide Passed - 05/22/2021  6:47 PM      Passed - Ca in normal range and within 360 days    Calcium  Date Value Ref Range Status  12/22/2020 9.5 8.6 - 10.4 mg/dL Final         Passed - Cr in normal range and within 360 days    Creat  Date Value Ref Range Status  12/22/2020 0.86 0.50 - 0.99 mg/dL Final    Comment:    For patients >78 years of age, the reference limit for Creatinine is approximately 13% higher for people identified as African-American. .          Passed - K in normal range and within 360 days    Potassium  Date Value Ref Range Status  12/22/2020 4.5 3.5 - 5.3 mmol/L Final         Passed - Na in normal range and within 360 days    Sodium  Date Value Ref Range Status  12/22/2020 138 135 - 146 mmol/L Final  07/16/2019 140 134 - 144 mmol/L Final         Passed - Last BP in normal range    BP Readings from Last 1 Encounters:  01/02/21 121/68         Passed - Valid encounter within last 6 months    Recent Outpatient Visits          4 months ago Acute non-recurrent sinusitis, unspecified location   [place], FNP   5 months ago Sinus congestion   [facility], Lupita Raider, FNP   5 months ago Acute non-recurrent sinusitis, unspecified location   [place], FNP   8 months ago Shortness of breath   [facility], Lupita Raider, [place]   9 months ago [facility] [place], Lupita Raider, [place]             . lisinopril (ZESTRIL) 10 MG tablet [Pharmacy Med Name: LISINOPRIL 10 MG TABLET] 90 tablet 0    Sig: TAKE 1 TABLET BY MOUTH EVERY DAY     Cardiovascular:  ACE Inhibitors Passed - 05/22/2021   6:47 PM      Passed - Cr in normal range and within 180 days    Creat  Date Value Ref Range Status  12/22/2020 0.86 0.50 - 0.99 mg/dL Final    Comment:    For patients >56 years of age, the reference limit for Creatinine is approximately 13% higher for people identified as African-American. .          Passed - K in normal range and within 180 days    Potassium  Date Value Ref Range Status  12/22/2020 4.5 3.5 - 5.3 mmol/L Final         Passed - Patient is not pregnant      Passed - Last BP in normal range    BP Readings from Last 1 Encounters:  01/02/21 121/68         Passed - Valid encounter within last 6 months    Recent Outpatient Visits          4 months ago Acute non-recurrent  sinusitis, unspecified location   [place], FNP   5 months ago Sinus congestion   [facility], Lupita Raider, FNP   5 months ago Acute non-recurrent sinusitis, unspecified location   [place], FNP   8 months ago Shortness of breath   [facility], Lupita Raider, [place]   9 months ago [facility] [place], Lupita Raider, [place]             . omeprazole (PRILOSEC) 10 MG capsule [Pharmacy Med Name: OMEPRAZOLE DR 10 MG CAPSULE] 90 capsule 0    Sig: TAKE 1 CAPSULE BY MOUTH EVERY DAY     Gastroenterology: Proton Pump Inhibitors Passed - 05/22/2021  6:47 PM      Passed - Valid encounter within last 12 months    Recent Outpatient Visits          4 months ago Acute non-recurrent sinusitis, unspecified location   [place], FNP   5 months ago Sinus congestion   [facility], Lupita Raider, FNP   5 months ago Acute non-recurrent sinusitis, unspecified location   [place], FNP   8 months ago Shortness of breath   [facility], Lupita Raider, FNP   9 months ago [facility] [place], Lupita Raider,

## 2021-05-23 ENCOUNTER — Other Ambulatory Visit: Payer: Self-pay

## 2021-05-23 DIAGNOSIS — M797 Fibromyalgia: Secondary | ICD-10-CM

## 2021-05-23 MED ORDER — NORTRIPTYLINE HCL 25 MG PO CAPS: 50.0000 mg | ORAL_CAPSULE | Freq: Every day | ORAL | 1 refills | Status: DC

## 2021-05-23 MED ORDER — DULOXETINE HCL 60 MG PO CPEP: 60.0000 mg | ORAL_CAPSULE | Freq: Every day | ORAL | 1 refills | Status: DC

## 2021-06-22 ENCOUNTER — Other Ambulatory Visit: Payer: Self-pay | Admitting: Family Medicine

## 2021-06-22 DIAGNOSIS — M797 Fibromyalgia: Secondary | ICD-10-CM

## 2021-06-22 NOTE — Telephone Encounter (Signed)
   Notes to clinic:  : REQUEST FOR 90 DAYS PRESCRIPTION. DX Code Needed.   Requested Prescriptions  Pending Prescriptions Disp Refills   DULoxetine (CYMBALTA) 60 MG capsule [Pharmacy Med Name: DULOXETINE HCL DR 60 MG CAP] 90 capsule 2    Sig: TAKE 1 CAPSULE BY MOUTH EVERY DAY      Psychiatry: Antidepressants - SNRI Passed - 06/22/2021  9:31 AM      Passed - Completed PHQ-2 or PHQ-9 in the last 360 days      Passed - Last BP in normal range    BP Readings from Last 1 Encounters:  01/02/21 121/68          Passed - Valid encounter within last 6 months    Recent Outpatient Visits           5 months ago Acute non-recurrent sinusitis, unspecified location   [place], FNP   6 months ago Sinus congestion   [facility] [place], Lupita Raider, FNP   6 months ago Acute non-recurrent sinusitis, unspecified location   [place], FNP   9 months ago Shortness of breath   [facility], Lupita Raider, [place]   10 months ago [facility] [place], Lupita Raider, [place]

## 2021-09-06 ENCOUNTER — Other Ambulatory Visit: Payer: Self-pay | Admitting: Family Medicine

## 2021-09-06 ENCOUNTER — Ambulatory Visit
Admission: RE | Admit: 2021-09-06 | Discharge: 2021-09-06 | Disposition: A | Discharge status: Home / Self Care | Payer: BLUE CROSS/BLUE SHIELD | Attending: Family Medicine | Admitting: Family Medicine

## 2021-09-06 ENCOUNTER — Other Ambulatory Visit: Payer: Self-pay

## 2021-09-06 ENCOUNTER — Ambulatory Visit: Payer: No Typology Code available for payment source | Admitting: Family Medicine

## 2021-09-06 ENCOUNTER — Ambulatory Visit
Admission: RE | Admit: 2021-09-06 | Discharge: 2021-09-06 | Disposition: A | Discharge status: Home / Self Care | Payer: BLUE CROSS/BLUE SHIELD | Source: Ambulatory Visit | Attending: Family Medicine | Admitting: Family Medicine

## 2021-09-06 ENCOUNTER — Encounter: Payer: Self-pay | Admitting: Family Medicine

## 2021-09-06 VITALS — BP 125/77 | HR 93 | Ht 65.5 in | Wt 198.6 lb

## 2021-09-06 DIAGNOSIS — M25531 Pain in right wrist: Secondary | ICD-10-CM

## 2021-09-06 DIAGNOSIS — M79641 Pain in right hand: Secondary | ICD-10-CM

## 2021-09-06 DIAGNOSIS — G8929 Other chronic pain: Secondary | ICD-10-CM

## 2021-09-06 DIAGNOSIS — U099 Post covid-19 condition, unspecified: Secondary | ICD-10-CM

## 2021-09-06 DIAGNOSIS — L820 Inflamed seborrheic keratosis: Secondary | ICD-10-CM | POA: Diagnosis not present

## 2021-09-06 DIAGNOSIS — I10 Essential (primary) hypertension: Secondary | ICD-10-CM

## 2021-09-06 DIAGNOSIS — J9809 Other diseases of bronchus, not elsewhere classified: Secondary | ICD-10-CM

## 2021-09-06 DIAGNOSIS — K219 Gastro-esophageal reflux disease without esophagitis: Secondary | ICD-10-CM

## 2021-09-06 MED ORDER — ADVAIR DISKUS 250-50 MCG/ACT IN AEPB: 1.0000 | INHALATION_SPRAY | Freq: Two times a day (BID) | RESPIRATORY_TRACT | 2 refills | Status: DC

## 2021-09-06 MED ORDER — OMEPRAZOLE 10 MG PO CPDR: 10.0000 mg | DELAYED_RELEASE_CAPSULE | Freq: Every day | ORAL | 3 refills | Status: DC

## 2021-09-06 MED ORDER — LISINOPRIL 10 MG PO TABS: 10.0000 mg | ORAL_TABLET | Freq: Every day | ORAL | 3 refills | Status: DC

## 2021-09-06 MED ORDER — HYDROCHLOROTHIAZIDE 12.5 MG PO TABS: 12.5000 mg | ORAL_TABLET | Freq: Every day | ORAL | 3 refills | Status: DC

## 2021-09-06 NOTE — Progress Notes (Signed)
Subjective:    Patient ID: Denise Elliott, female    DOB: 12/02/1961, 60 y.o.   MRN: [id number]  Denise Elliott is a 60 y.o. female presenting on 09/06/2021 for Hand Injury   HPI  Right Wrist/hand injury April 2022 Fall injury [facility] Repeat fall in mid July says Asking for X-ray Right Hand / Wrist today Pain with some movements grip. She has chronic pain with Fibro hard to tell with hand.  Seborrheic Keratosis Inflamed Skin lesion on upper back  Abnormal Weight Gain Post COVID Syndrome Issues with husband having cancer, COVID limiting in breathing Has allergies and inhaler, albuterol PRN. Never on maintenance inhaler Was referred to [facility] but never went. Postural dizziness episodes  Health Maintenance:  Shingrix vaccine in future when ready.   Depression screen [facility] 2/9 09/06/2021 08/24/2020 05/24/2020  Decreased Interest 0 0 0  Down, Depressed, Hopeless 0 0 1  PHQ - 2 Score 0 0 1  Altered sleeping 0 - 1  Tired, decreased energy 3 - 3  Change in appetite 1 - 1  Feeling bad or failure about yourself  0 - 0  Trouble concentrating 0 - 1  Moving slowly or fidgety/restless 0 - 2  Suicidal thoughts 0 - 0  PHQ-9 Score 4 - 9  Difficult doing work/chores Somewhat difficult - Somewhat difficult    Social History   Tobacco Use   Smoking status: Never   Smokeless tobacco: Never  Vaping Use   Vaping Use: Never used  Substance Use Topics   Alcohol use: Yes    Comment: socially   Drug use: No    Review of Systems Per HPI unless specifically indicated above     Objective:    BP 125/77   Pulse 93   Ht 5' 5.5" (1.664 m)   Wt 198 lb 9.6 oz (90.1 kg)   LMP 02/03/2020   SpO2 100%   BMI 32.55 kg/m   Wt Readings from Last 3 Encounters:  09/06/21 198 lb 9.6 oz (90.1 kg)  01/02/21 188 lb (85.3 kg)  12/22/20 187 lb (84.8 kg)    Physical Exam Vitals and nursing note reviewed.  Constitutional:      General: She is not in acute distress.    Appearance: She is  well-developed. She is not diaphoretic.     Comments: Well-appearing, comfortable, cooperative  HENT:     Head: Normocephalic and atraumatic.  Eyes:     General:        Right eye: No discharge.        Left eye: No discharge.     Conjunctiva/sclera: Conjunctivae normal.  Neck:     Thyroid: No thyromegaly.  Cardiovascular:     Rate and Rhythm: Normal rate and regular rhythm.     Heart sounds: Normal heart sounds. No murmur heard. Pulmonary:     Effort: Pulmonary effort is normal. No respiratory distress.     Breath sounds: Normal breath sounds. No wheezing or rales.  Musculoskeletal:        General: Normal range of motion.     Cervical back: Normal range of motion and neck supple.     Comments: Right wrist in velcro splint. Has some pain with grip and ROM. No focal tenderness  Lymphadenopathy:     Cervical: No cervical adenopathy.  Skin:    General: Skin is warm and dry.     Findings: No erythema or rash.  Neurological:     Mental Status: She is alert and  oriented to person, place, and time.  Psychiatric:        Behavior: Behavior normal.     Comments: Well groomed, good eye contact, normal speech and thoughts      Results for orders placed or performed in visit on 12/22/20  COMPLETE METABOLIC PANEL WITH GFR  Result Value Ref Range   Glucose, Bld 74 65 - 99 mg/dL   BUN 18 7 - 25 mg/dL   Creat 0.86 0.50 - 0.99 mg/dL   GFR, Est Non African American 73 > OR = 60 mL/min/1.9m2   GFR, Est African American 85 > OR = 60 mL/min/1.55m2   BUN/Creatinine Ratio NOT APPLICABLE 6 - 22 (calc)   Sodium 138 135 - 146 mmol/L   Potassium 4.5 3.5 - 5.3 mmol/L   Chloride 102 98 - 110 mmol/L   CO2 28 20 - 32 mmol/L   Calcium 9.5 8.6 - 10.4 mg/dL   Total Protein 6.8 6.1 - 8.1 g/dL   Albumin 4.2 3.6 - 5.1 g/dL   Globulin 2.6 1.9 - 3.7 g/dL (calc)   AG Ratio 1.6 1.0 - 2.5 (calc)   Total Bilirubin 0.3 0.2 - 1.2 mg/dL   Alkaline phosphatase (APISO) 97 37 - 153 U/L   AST 15 10 - 35 U/L    ALT 15 6 - 29 U/L      Assessment & Plan:   Problem List Items Addressed This Visit   None Visit Diagnoses     Chronic pain of right wrist    -  Primary   Relevant Orders   DG Wrist Complete Right   Right hand pain       Relevant Orders   DG Hand Complete Right   Seborrheic keratoses, inflamed       Post-COVID syndrome       Relevant Medications   ADVAIR DISKUS 250-50 MCG/ACT AEPB   Recurrent bronchospasm       Relevant Medications   ADVAIR DISKUS 250-50 MCG/ACT AEPB       Post COVID Cough bronchospasm Trial on Advair - new rx sent  R Hand Wrist Pain Prior fall injury repeat Despite duration of prior months now weeks from 2nd fall cannot rule out frx. However less likely given description of pain and history of fibro Will order X-ray today hand wrist and f/u images  SK Inflamed Upper back, can monitor use topical steroid / lotion and f/u with dermatology w referral in future if want to remove it  Meds ordered this encounter  Medications   ADVAIR DISKUS 250-50 MCG/ACT AEPB    Sig: Inhale 1 puff into the lungs in the morning and at bedtime.    Dispense:  60 each    Refill:  2     Follow up plan: Return if symptoms worsen or fail to improve.   [name], [facility] 09/06/2021, 8:34 AM

## 2021-09-06 NOTE — Patient Instructions (Addendum)
Thank you for coming to the office today.  Try new inhaler Advair 1 puff twice a day for maintenance.  X-rays stay tuned for results  START anti inflammatory topical - OTC Voltaren (generic Diclofenac) topical 2-4 times a day as needed for pain swelling of affected joint for 1-2 weeks or longer.  Try to stay hydrated, caution with quick standing suddenly.  Seborrheic keratosis (seb-o-REE-ik care-uh-TOE-sis) is a common skin growth. It may seem worrisome because it can look like a wart, pre-cancerous skin growth (actinic keratosis), or skin cancer. Despite their appearance, seborrheic keratoses are harmless. Most people get these growths when they are middle aged or older. Because they begin at a later age and can have a wart-like appearance, seborrheic keratoses are often called the "barnacles of aging." It's possible to have just one of these growths, but most people develop several. Some growths may have a warty surface while others look like dabs of warm, Loving candle wax on the skin. Seborrheic keratoses range in color from white to black; however, most are tan or Viveiros. You can find these harmless growths anywhere on the skin, except the palms and soles. Most often, you'll see them on the chest, back, head, or neck.  Use cortisone on spot on neck.  Future derm can remove in future if want   Please schedule a Follow-up Appointment to: Return if symptoms worsen or fail to improve.  If you have any other questions or concerns, please feel free to call the office or send a message through [place]. You may also schedule an earlier appointment if necessary.  Additionally, you may be receiving a survey about your experience at our office within a few days to 1 week by e-mail or mail. We value your feedback.  [name], DO [place]

## 2021-11-13 ENCOUNTER — Other Ambulatory Visit: Payer: Self-pay

## 2021-11-13 ENCOUNTER — Encounter: Payer: Self-pay | Admitting: Emergency Medicine

## 2021-11-13 ENCOUNTER — Ambulatory Visit
Admission: EM | Admit: 2021-11-13 | Discharge: 2021-11-13 | Disposition: A | Discharge status: Home / Self Care | Payer: BLUE CROSS/BLUE SHIELD | Attending: Emergency Medicine | Admitting: Emergency Medicine

## 2021-11-13 DIAGNOSIS — J069 Acute upper respiratory infection, unspecified: Secondary | ICD-10-CM

## 2021-11-13 DIAGNOSIS — L299 Pruritus, unspecified: Secondary | ICD-10-CM

## 2021-11-13 MED ORDER — IPRATROPIUM BROMIDE 0.06 % NA SOLN: 2.0000 | Freq: Four times a day (QID) | NASAL | 12 refills | Status: DC

## 2021-11-13 NOTE — ED Provider Notes (Signed)
[facility]    CSN: [id number] Arrival date & time: 11/13/21  1139      History   Chief Complaint Chief Complaint  Patient presents with   Otalgia    left   Pruritis    HPI Denise Elliott is a 60 y.o. female.   HPI  60 year old female here for evaluation of left ear pain and itching palms.  Patient reports that she is been experiencing left ear pain for last 4 days and itching in her palms for the past week.  She Dors is having some runny nose and nasal congestion, a cough when she lays down at night and first in the morning, and nausea.  She states that her palms itched when she had COVID but she is never experienced that before in her life.  She denies any rashes.  She has tried using hand lotion to alleviate the symptoms without any relief.  She states he feels like it is deeper and in the palms of her hands rather than on the surface of the skin.  She denies sore throat, vomiting, or diarrhea.  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   Plantar fasciitis    Sleep apnea    used CPAP, lost 60 pounds no issue now   Tinnitus    wears hearing aids   Varicose veins of both legs with edema    Vertigo    inner ear imbalance    Patient Active Problem List   Diagnosis Date Noted   Sinus congestion 12/22/2020   Acute non-recurrent  sinusitis 12/04/2020   Shortness of breath 09/21/2020   Weight gain 09/21/2020   COVID-19 08/15/2020   Nausea 08/15/2020   Myalgia 07/13/2020   Arthralgia of back 07/13/2020   Special screening for malignant neoplasms, colon    Polyp of descending colon    Persistent postural-perceptual dizziness 04/26/2020   Joint pain 04/26/2020   Screening for colon cancer 04/26/2020   Statin intolerance 04/26/2020   Toenail fungus 04/26/2020   Moderate recurrent major depression ([place]) 06/17/2019   GERD (gastroesophageal reflux disease) 07/29/2017   Arthritis 07/29/2017   Fibromyalgia 07/29/2017   HLD (hyperlipidemia) 07/29/2017   Hypertension 07/29/2017   Circulation disorder of lower extremity 07/29/2017   Anxiety 06/29/2017   Encounter to establish care 06/29/2017   History of fibromyalgia 03/25/2017   Insomnia secondary to depression with anxiety 03/25/2017   Occipital neuralgia of left side 03/25/2017   Cervical dystonia 10/19/2016    Past Surgical History:  Procedure Laterality Date   CHOLECYSTECTOMY     COLONOSCOPY WITH PROPOFOL N/A 05/26/2020   Procedure: COLONOSCOPY WITH PROPOFOL;  Surgeon: Lucilla Lame, MD;  Location:  [facility];  Service: Endoscopy;  Laterality: N/A;  priority 4   COSMETIC SURGERY Left    broken cheek bone   GALLBLADDER SURGERY     POLYPECTOMY  05/26/2020   Procedure: POLYPECTOMY;  Surgeon: Lucilla Lame, MD;  Location: [facility];  Service: Endoscopy;;   TONSILLECTOMY     TONSILLECTOMY      OB History     Gravida  5   Para  3   Term  2   Preterm      AB  2   Living  3      SAB  2   IAB      Ectopic      Multiple      Live Births  3            Home Medications    Prior to Admission medications   Medication Sig Start Date End Date Taking? Authorizing Provider  ipratropium (ATROVENT) 0.06 % nasal spray Place 2 sprays into both nostrils 4 (four) times daily. 11/13/21  Yes Margarette Canada, NP  ADVAIR DISKUS 250-50  MCG/ACT AEPB Inhale 1 puff into the lungs in the morning and at bedtime. 09/06/21   Karamalegos, Devonne Doughty, DO  albuterol (PROAIR HFA) 108 (90 Base) MCG/ACT inhaler Inhale 1-2 puffs into the lungs every 6 (six) hours as needed for wheezing or shortness of breath. 09/21/20   Malfi, Lupita Raider, FNP  DULoxetine (CYMBALTA) 60 MG capsule TAKE 1 CAPSULE BY MOUTH EVERY DAY 06/22/21   Parks Ranger, Devonne Doughty, DO  hydrochlorothiazide (HYDRODIURIL) 12.5 MG tablet Take 1 tablet (12.5 mg total) by mouth daily. 09/06/21   Karamalegos, Devonne Doughty, DO  lisinopril (ZESTRIL) 10 MG tablet Take 1 tablet (10 mg total) by mouth daily. 09/06/21   Karamalegos, Devonne Doughty, DO  meclizine (ANTIVERT) 12.5 MG tablet Take 12.5 mg by mouth 3 (three) times daily as needed.    [provider]  montelukast (SINGULAIR) 10 MG tablet Take 1 tablet (10 mg total) by mouth at bedtime. 12/22/20   Malfi, Lupita Raider, FNP  nortriptyline (PAMELOR) 25 MG capsule Take 2 capsules (50 mg total) by mouth at bedtime. 05/23/21   Karamalegos, Devonne Doughty, DO  omeprazole (PRILOSEC) 10 MG capsule Take 1 capsule (10 mg total) by mouth daily. 09/06/21   Karamalegos, Devonne Doughty, DO    Family History Family History  Problem Relation Age of Onset   Colon polyps Mother    Hypertension Mother    Goiter Mother    AAA (abdominal aortic aneurysm) Father    Alcohol abuse Father    Healthy Sister    Breast cancer Sister 20   Healthy Daughter    Healthy Son    Colon cancer Maternal Uncle    Vision loss Maternal Grandmother    Liver cancer Paternal Grandfather    Alcohol abuse Paternal Grandfather    Healthy Sister    Healthy Sister    Healthy Son    Bladder Cancer Neg Hx    Kidney cancer Neg Hx    Prostate cancer Neg Hx     Social History Social History   Tobacco Use   Smoking status: Never   Smokeless tobacco: Never  Vaping Use   Vaping Use: Never used  Substance Use Topics   Alcohol use: Yes    Comment: socially   Drug use: No      Allergies   Versed [midazolam], Grapeseed extract [nutritional supplements], and Proanthocyanidin   Review of Systems Review of  Systems  Constitutional:  Negative for activity change, appetite change and fever.  HENT:  Positive for congestion, ear pain, postnasal drip, rhinorrhea and tinnitus. Negative for ear discharge, hearing loss and sore throat.   Respiratory:  Positive for cough. Negative for shortness of breath and wheezing.   Gastrointestinal:  Positive for nausea. Negative for vomiting.  Skin:  Negative for rash.       Itching of palms.  Hematological: Negative.   Psychiatric/Behavioral: Negative.      Physical Exam Triage Vital Signs ED Triage Vitals  Enc Vitals Group     BP 11/13/21 1429 (!) 129/91     Pulse Rate 11/13/21 1429 95     Resp 11/13/21 1429 16     Temp 11/13/21 1429 98.4 F (36.9 C)     Temp Source 11/13/21 1429 Oral     SpO2 11/13/21 1429 99 %     Weight --      Height --      Head Circumference --      Peak Flow --      Pain Score 11/13/21 1426 7     Pain Loc --      Pain Edu? --      Excl. in [place]? --    No data found.  Updated Vital Signs BP (!) 129/91 (BP Location: Left Arm)   Pulse 95   Temp 98.4 F (36.9 C) (Oral)   Resp 16   LMP 02/03/2020   SpO2 99%   Visual Acuity Right Eye Distance:   Left Eye Distance:   Bilateral Distance:    Right Eye Near:   Left Eye Near:    Bilateral Near:     Physical Exam Vitals and nursing note reviewed.  Constitutional:      General: She is not in acute distress.    Appearance: Normal appearance. She is normal weight. She is not ill-appearing.  HENT:     Head: Normocephalic and atraumatic.     Right Ear: Tympanic membrane, ear canal and external ear normal. There is no impacted cerumen.     Left Ear: Ear canal and external ear normal.     Ears:     Comments: Serous effusion behind left tympanic membrane.  No erythema.    Nose: Congestion and rhinorrhea present.     Mouth/Throat:      Mouth: Mucous membranes are moist.     Pharynx: Oropharynx is clear. No posterior oropharyngeal erythema.  Cardiovascular:     Rate and Rhythm: Normal rate and regular rhythm.     Pulses: Normal pulses.     Heart sounds: Normal heart sounds. No murmur heard.   No gallop.  Pulmonary:     Effort: Pulmonary effort is normal.     Breath sounds: Normal breath sounds. No wheezing, rhonchi or rales.  Musculoskeletal:     Cervical back: Normal range of motion and neck supple.  Lymphadenopathy:     Cervical: No cervical adenopathy.  Skin:    General: Skin is warm and dry.     Capillary Refill: Capillary refill takes less than 2 seconds.     Findings: No erythema or rash.  Neurological:     General: No focal deficit present.     Mental Status: She is alert and oriented to person, place, and time.  Psychiatric:        Mood and Affect: Mood normal.        Behavior: Behavior normal.  Thought Content: Thought content normal.        Judgment: Judgment normal.     UC Treatments / Results  Labs (all labs ordered are listed, but only abnormal results are displayed) Labs Reviewed - No data to display  EKG   Radiology No results found.  Procedures Procedures (including critical care time)  Medications Ordered in UC Medications - No data to display  Initial Impression / Assessment and Plan / UC Course  I have reviewed the triage vital signs and the nursing notes.  Pertinent labs & imaging results that were available during my care of the patient were reviewed by me and considered in my medical decision making (see chart for details).  Appearing 60 year old female here for evaluation of itching to both palms that is been present for a week and left ear pain that has been present for last 4 days as outlined in the HPI above.  Patient states that she had itching in her palms when she had COVID previously.  She has not had any rashes.  On physical exam her hands are cold and dry but  there are no rashes visible.  No other lesions noted as well.  Upper respiratory exam reveals pearly gray tympanic membranes bilaterally with normal light reflex.  There is a serous effusion behind the left ear.  Nasal mucosa is mildly erythematous and edematous with clear nasal discharge.  Posterior oropharynx has some clear postnasal drip without significant erythema as well.  No cervical lymphadenopathy on exam.  Cardiopulmonary exam is benign.  Suspect patient has some mild eustachian tube dysfunction as a result of her upper respiratory symptoms.  These are most likely viral and may be the cause for the itching of her palms.  I walked the patient through equalizing her ears and she reports that she did get some relief of the pressure in her left ear after 1 trial of equalization.  I encouraged her to continue doing that periodically throughout the day to try and clear the fluid from her left middle ear.  She can also sleep on a hot water bottle or heating pad to help dilate up the eustachian tube and help it drain.  We will prescribe Atrovent nasal spray to help with the nasal congestion and postnasal drip as well.  Patient can do 2 squirts up each nostril 4 times a day.   Final Clinical Impressions(s) / UC Diagnoses   Final diagnoses:  Upper respiratory tract infection, unspecified type  Pruritus     Discharge Instructions      Use the Atrovent nasal spray, 2 squirts in each nostril every 6 hours to help with nasal congestion and postnasal drip.   Use over-the-counter Tylenol and ibuprofen as needed for pain or fever.  Place a hot water bottle, or heating pad, underneath your pillowcase at night to help dilate up your ear and aid in pain relief as well as resolution of the infection.  Continue to equalize your ears as shown in the exam room to clear the middle ear fluid.   Return for reevaluation for any new or worsening symptoms.      ED Prescriptions     Medication Sig Dispense  Auth. Provider   ipratropium (ATROVENT) 0.06 % nasal spray Place 2 sprays into both nostrils 4 (four) times daily. 15 mL Margarette Canada, NP      PDMP not reviewed this encounter.   Margarette Canada, NP 11/13/21 1546

## 2021-11-13 NOTE — Discharge Instructions (Signed)
Use the Atrovent nasal spray, 2 squirts in each nostril every 6 hours to help with nasal congestion and postnasal drip.   Use over-the-counter Tylenol and ibuprofen as needed for pain or fever.  Place a hot water bottle, or heating pad, underneath your pillowcase at night to help dilate up your ear and aid in pain relief as well as resolution of the infection.  Continue to equalize your ears as shown in the exam room to clear the middle ear fluid.   Return for reevaluation for any new or worsening symptoms.

## 2021-11-13 NOTE — ED Triage Notes (Signed)
Pt presents today with c/o of pain to left ear x 4 days. She also c/o of itching to bilateral hands x 1 week.

## 2021-11-19 ENCOUNTER — Other Ambulatory Visit: Payer: Self-pay | Admitting: Family Medicine

## 2021-11-19 DIAGNOSIS — M797 Fibromyalgia: Secondary | ICD-10-CM

## 2021-11-19 NOTE — Telephone Encounter (Signed)
Requested Prescriptions  Pending Prescriptions Disp Refills  . nortriptyline (PAMELOR) 25 MG capsule [Pharmacy Med Name: NORTRIPTYLINE HCL 25 MG CAP] 180 capsule 1    Sig: TAKE 2 CAPSULES (50 MG TOTAL) BY MOUTH AT BEDTIME.     Psychiatry:  Antidepressants - Heterocyclics (TCAs) Passed - 11/19/2021  1:33 AM      Passed - Completed PHQ-2 or PHQ-9 in the last 360 days      Passed - Valid encounter within last 6 months    Recent Outpatient Visits          2 months ago Chronic pain of right wrist   [place], DO   10 months ago Acute non-recurrent sinusitis, unspecified location   [facility], Lupita Raider, [place]   11 months ago Sinus congestion   [facility], Lupita Raider, FNP   11 months ago Acute non-recurrent sinusitis, unspecified location   [place], FNP   1 year ago Shortness of breath   [facility], Lupita Raider, [place]

## 2022-01-22 ENCOUNTER — Other Ambulatory Visit: Payer: Self-pay

## 2022-01-22 ENCOUNTER — Encounter: Payer: Self-pay | Admitting: Family Medicine

## 2022-01-22 ENCOUNTER — Ambulatory Visit: Payer: BC Managed Care – PPO | Admitting: Family Medicine

## 2022-01-22 VITALS — BP 121/82 | HR 91 | Ht 65.5 in | Wt 199.2 lb

## 2022-01-22 DIAGNOSIS — J0110 Acute frontal sinusitis, unspecified: Secondary | ICD-10-CM

## 2022-01-22 DIAGNOSIS — H65492 Other chronic nonsuppurative otitis media, left ear: Secondary | ICD-10-CM | POA: Diagnosis not present

## 2022-01-22 DIAGNOSIS — H6982 Other specified disorders of Eustachian tube, left ear: Secondary | ICD-10-CM

## 2022-01-22 MED ORDER — FLUTICASONE PROPIONATE 50 MCG/ACT NA SUSP: 2.0000 | Freq: Every day | NASAL | 3 refills | Status: DC

## 2022-01-22 MED ORDER — PREDNISONE 10 MG PO TABS: ORAL_TABLET | ORAL | 0 refills | Status: DC

## 2022-01-22 MED ORDER — AMOXICILLIN-POT CLAVULANATE 875-125 MG PO TABS: 1.0000 | ORAL_TABLET | Freq: Two times a day (BID) | ORAL | 0 refills | Status: DC

## 2022-01-22 NOTE — Progress Notes (Signed)
Subjective:    Patient ID: Denise Elliott, female    DOB: 11-19-1961, 61 y.o.   MRN: [id number]  Denise Elliott is a 61 y.o. female presenting on 01/22/2022 for Sinusitis   HPI  Sinusitis, acute vs chronic Chronic Left Ear Eustachian Tube Dysfunction Effusion Onset around 11/13/21, she went to [facility],  Admits sinus pain pressure, persistent sinus drainage and cough. Tried Mucinex  Admits 5 year history with inner ear imbalance, difficulty with dizziness and vertigo. Has been managed by ENT, and they determined not candidate for tubes in ears.  Has Flonase at home, not using regularly. Previously on Atrovent Has azelastine Takes Singulair  Left ear fluid behind ear   Depression screen [facility] 2/9 01/22/2022 09/06/2021 08/24/2020  Decreased Interest 0 0 0  Down, Depressed, Hopeless 1 0 0  PHQ - 2 Score 1 0 0  Altered sleeping 0 0 -  Tired, decreased energy 1 3 -  Change in appetite 0 1 -  Feeling bad or failure about yourself  0 0 -  Trouble concentrating 1 0 -  Moving slowly or fidgety/restless 0 0 -  Suicidal thoughts 0 0 -  PHQ-9 Score 3 4 -  Difficult doing work/chores Not difficult at all Somewhat difficult -    Social History   Tobacco Use   Smoking status: Never   Smokeless tobacco: Never  Vaping Use   Vaping Use: Never used  Substance Use Topics   Alcohol use: Yes    Comment: socially   Drug use: No    Review of Systems Per HPI unless specifically indicated above     Objective:    BP 121/82    Pulse 91    Ht 5' 5.5" (1.664 m)    Wt 199 lb 3.2 oz (90.4 kg)    LMP 02/03/2020    SpO2 100%    BMI 32.64 kg/m   Wt Readings from Last 3 Encounters:  01/22/22 199 lb 3.2 oz (90.4 kg)  09/06/21 198 lb 9.6 oz (90.1 kg)  01/02/21 188 lb (85.3 kg)    Physical Exam Vitals and nursing note reviewed.  Constitutional:      General: She is not in acute distress.    Appearance: Normal appearance. She is well-developed. She is not diaphoretic.      Comments: Well-appearing, comfortable, cooperative  HENT:     Head: Normocephalic and atraumatic.     Right Ear: Ear canal and external ear normal. There is no impacted cerumen.     Left Ear: Ear canal and external ear normal. There is no impacted cerumen.     Ears:     Comments: L ear clear effusion, no erythema Eyes:     General:        Right eye: No discharge.        Left eye: No discharge.     Conjunctiva/sclera: Conjunctivae normal.  Neck:     Thyroid: No thyromegaly.  Cardiovascular:     Rate and Rhythm: Normal rate and regular rhythm.     Heart sounds: Normal heart sounds. No murmur heard. Pulmonary:     Effort: Pulmonary effort is normal. No respiratory distress.     Breath sounds: Normal breath sounds. No wheezing or rales.  Musculoskeletal:        General: Normal range of motion.     Cervical back: Normal range of motion and neck supple.  Lymphadenopathy:     Cervical: No cervical adenopathy.  Skin:  General: Skin is warm and dry.     Findings: No erythema or rash.  Neurological:     Mental Status: She is alert and oriented to person, place, and time.  Psychiatric:        Mood and Affect: Mood normal.        Behavior: Behavior normal.        Thought Content: Thought content normal.     Comments: Well groomed, good eye contact, normal speech and thoughts      Results for orders placed or performed in visit on 12/22/20  COMPLETE METABOLIC PANEL WITH GFR  Result Value Ref Range   Glucose, Bld 74 65 - 99 mg/dL   BUN 18 7 - 25 mg/dL   Creat 0.86 0.50 - 0.99 mg/dL   GFR, Est Non African American 73 > OR = 60 mL/min/1.43m2   GFR, Est African American 85 > OR = 60 mL/min/1.34m2   BUN/Creatinine Ratio NOT APPLICABLE 6 - 22 (calc)   Sodium 138 135 - 146 mmol/L   Potassium 4.5 3.5 - 5.3 mmol/L   Chloride 102 98 - 110 mmol/L   CO2 28 20 - 32 mmol/L   Calcium 9.5 8.6 - 10.4 mg/dL   Total Protein 6.8 6.1 - 8.1 g/dL   Albumin 4.2 3.6 - 5.1 g/dL   Globulin 2.6 1.9  - 3.7 g/dL (calc)   AG Ratio 1.6 1.0 - 2.5 (calc)   Total Bilirubin 0.3 0.2 - 1.2 mg/dL   Alkaline phosphatase (APISO) 97 37 - 153 U/L   AST 15 10 - 35 U/L   ALT 15 6 - 29 U/L      Assessment & Plan:   Problem List Items Addressed This Visit     Acute non-recurrent sinusitis - Primary   Relevant Medications   amoxicillin-clavulanate (AUGMENTIN) 875-125 MG tablet   fluticasone (FLONASE) 50 MCG/ACT nasal spray   predniSONE (DELTASONE) 10 MG tablet   Other Visit Diagnoses     Eustachian tube dysfunction, left       Relevant Medications   fluticasone (FLONASE) 50 MCG/ACT nasal spray   predniSONE (DELTASONE) 10 MG tablet   Chronic middle ear effusion, left       Relevant Medications   amoxicillin-clavulanate (AUGMENTIN) 875-125 MG tablet       - Start Augmentin 1 pill twice daily (breakfast and dinner, with food and plenty of water) for 10 days, complete entire course, do not stop early even if feeling better  Start nasal steroid Flonase 2 sprays in each nostril daily for 4-6 weeks, may repeat course seasonally or as needed Keep on Azelastine Keep on Singulair  Add Prednisone taper 6 days for sinus pressure swelling  Add OTC Sudafed short acting 4-6 hours (Behind the counter - show ID to get the original sudafed) take ONLY OCCASIONALLY - use as needed for sinus pressure swelling.  - Recommend to keep using Nasal Saline spray multiple times a day to help flush out congestion and clear sinuses - Improve hydration by drinking plenty of clear fluids (water, gatorade) to reduce secretions and thin congestion - Can take Tylenol or Ibuprofen as needed for fevers  If still not improved, call up ENT to follow up on the Left ear fluid.  Meds ordered this encounter  Medications   amoxicillin-clavulanate (AUGMENTIN) 875-125 MG tablet    Sig: Take 1 tablet by mouth 2 (two) times daily.    Dispense:  20 tablet    Refill:  0   fluticasone (FLONASE)  50 MCG/ACT nasal spray    Sig:  Place 2 sprays into both nostrils daily. Use for 4-6 weeks then stop and use seasonally or as needed.    Dispense:  16 g    Refill:  3   predniSONE (DELTASONE) 10 MG tablet    Sig: Take 6 tabs with breakfast Day 1, 5 tabs Day 2, 4 tabs Day 3, 3 tabs Day 4, 2 tabs Day 5, 1 tab Day 6.    Dispense:  21 tablet    Refill:  0      Follow up plan: Return if symptoms worsen or fail to improve.   [name], [facility] 01/22/2022, 9:39 AM

## 2022-01-22 NOTE — Patient Instructions (Addendum)
Thank you for coming to the office today.  Persistent Fluid behind L ear, it is clear. No sign of infection there.  1. It sounds like you have a Sinusitis (Bacterial Infection) - this most likely started as an Upper Respiratory Virus that has settled into an infection. Allergies can also cause this. - Start Augmentin 1 pill twice daily (breakfast and dinner, with food and plenty of water) for 10 days, complete entire course, do not stop early even if feeling better  Start nasal steroid Flonase 2 sprays in each nostril daily for 4-6 weeks, may repeat course seasonally or as needed Keep on Azelastine Keep on Singulair  Add Prednisone taper 6 days for sinus pressure swelling  Add OTC Sudafed short acting 4-6 hours (Behind the counter - show ID to get the original sudafed) take ONLY OCCASIONALLY - use as needed for sinus pressure swelling.  - Recommend to keep using Nasal Saline spray multiple times a day to help flush out congestion and clear sinuses - Improve hydration by drinking plenty of clear fluids (water, gatorade) to reduce secretions and thin congestion - Can take Tylenol or Ibuprofen as needed for fevers  If still not improved, call up ENT to follow up on the Left ear fluid.  Please schedule a Follow-up Appointment to: Return if symptoms worsen or fail to improve.  If you have any other questions or concerns, please feel free to call the office or send a message through [facility]. You may also schedule an earlier appointment if necessary.  Additionally, you may be receiving a survey about your experience at our office within a few days to 1 week by e-mail or mail. We value your feedback.  [name], DO [place]

## 2022-03-22 ENCOUNTER — Other Ambulatory Visit: Payer: Self-pay | Admitting: Family Medicine

## 2022-03-22 DIAGNOSIS — M797 Fibromyalgia: Secondary | ICD-10-CM

## 2022-03-22 NOTE — Telephone Encounter (Signed)
Requested medication (s) are due for refill today: yes ? ?Requested medication (s) are on the active medication list: yes ? ?Last refill:  06/22/21 #90/2 ? ?Future visit scheduled: no ? ?Notes to clinic:  Unable to refill per protocol due to failed labs, no updated results. ? ? ?  ?Requested Prescriptions  ?Pending Prescriptions Disp Refills  ? DULoxetine (CYMBALTA) 60 MG capsule [Pharmacy Med Name: DULOXETINE HCL DR 60 MG CAP] 90 capsule 2  ?  Sig: TAKE 1 CAPSULE BY MOUTH EVERY DAY  ?  ? Psychiatry: Antidepressants - SNRI - duloxetine Failed - 03/22/2022  2:20 AM  ?  ?  Failed - Cr in normal range and within 360 days  ?  Creat  ?Date Value Ref Range Status  ?12/22/2020 0.86 0.50 - 0.99 mg/dL Final  ?  Comment:  ?  For patients >49 years of age, the reference limit ?for Creatinine is approximately 13% higher for people ?identified as African-American. ?. ?  ?  ?  ?  ?  Failed - eGFR is 30 or above and within 360 days  ?  GFR, Est African American  ?Date Value Ref Range Status  ?12/22/2020 85 > OR = 60 mL/min/1.73m2 Final  ? ?GFR, Est Non African American  ?Date Value Ref Range Status  ?12/22/2020 73 > OR = 60 mL/min/1.73m2 Final  ?  ?  ?  ?  Passed - Completed PHQ-2 or PHQ-9 in the last 360 days  ?  ?  Passed - Last BP in normal range  ?  BP Readings from Last 1 Encounters:  ?01/22/22 121/82  ?  ?  ?  ?  Passed - Valid encounter within last 6 months  ?  Recent Outpatient Visits   ? ?      ? 1 month ago Subacute frontal sinusitis  ? [facility] Karamalegos, Alexander J, DO  ? 6 months ago Chronic pain of right wrist  ? [facility] Karamalegos, Alexander J, DO  ? 1 year ago Acute non-recurrent sinusitis, unspecified location  ? [facility] Malfi, Nicole M, FNP  ? 1 year ago Sinus congestion  ? [facility] Malfi, Nicole M, FNP  ? 1 year ago Acute non-recurrent sinusitis, unspecified location  ? [facility] Malfi, Nicole M, FNP  ? ?  ?  ? ?  ?   ?  ? ?

## 2022-03-25 ENCOUNTER — Ambulatory Visit: Payer: Self-pay

## 2022-03-25 ENCOUNTER — Encounter: Payer: Self-pay | Admitting: Family Medicine

## 2022-03-25 ENCOUNTER — Ambulatory Visit (INDEPENDENT_AMBULATORY_CARE_PROVIDER_SITE_OTHER): Payer: BC Managed Care – PPO | Admitting: Family Medicine

## 2022-03-25 VITALS — BP 122/68 | HR 84 | Ht 65.5 in | Wt 197.4 lb

## 2022-03-25 DIAGNOSIS — R002 Palpitations: Secondary | ICD-10-CM | POA: Diagnosis not present

## 2022-03-25 DIAGNOSIS — R000 Tachycardia, unspecified: Secondary | ICD-10-CM

## 2022-03-25 DIAGNOSIS — Z8249 Family history of ischemic heart disease and other diseases of the circulatory system: Secondary | ICD-10-CM

## 2022-03-25 DIAGNOSIS — R110 Nausea: Secondary | ICD-10-CM | POA: Diagnosis not present

## 2022-03-25 DIAGNOSIS — F410 Panic disorder [episodic paroxysmal anxiety] without agoraphobia: Secondary | ICD-10-CM

## 2022-03-25 DIAGNOSIS — F411 Generalized anxiety disorder: Secondary | ICD-10-CM

## 2022-03-25 MED ORDER — METOPROLOL TARTRATE 25 MG PO TABS: 25.0000 mg | ORAL_TABLET | Freq: Two times a day (BID) | ORAL | 2 refills | Status: DC | PRN

## 2022-03-25 NOTE — Telephone Encounter (Signed)
Will discuss at upcoming appt.

## 2022-03-25 NOTE — Patient Instructions (Addendum)
Thank you for coming to the office today. ? ?Recommend trial of increasing Anti-inflammatory with Ibuprofen '200mg'$  tabs - take up to 3 with food and plenty of water THREE times daily every day (breakfast and dinner), for now - can phase down if doing better. May try this for 2 to 4 weeks, then you may take only as needed ?- DO NOT TAKE any advil, aleve, motrin while you are taking this medicine ? ?- It is safe to take Tylenol Ext Str '500mg'$  tabs - take 1 to 2 (max dose '1000mg'$ ) every 6 hours as needed for breakthrough pain, max 24 hour daily dose is 6 to 8 tablets or '4000mg'$  ? ?Start Rx Metoprolol as needed ONLY for flare up with racing heart and palpitations, take one twice a day if you need. ? ?[facility] ?[address] ?[address] ?[place], [place] [address] ?Main: [phone]  ? ?Stay tuned for apt. ? ?Please schedule a Follow-up Appointment to: Return in about 3 months (around 06/24/2022), or if symptoms worsen or fail to improve, for within 3 months follow up anxiety / fibro/arthritis pain, cardiology update. ? ?If you have any other questions or concerns, please feel free to call the office or send a message through [facility]. You may also schedule an earlier appointment if necessary. ? ?Additionally, you may be receiving a survey about your experience at our office within a few days to 1 week by e-mail or mail. We value your feedback. ? ?[name], DO ?[place] ?
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
## 2022-03-25 NOTE — Progress Notes (Signed)
? ?Subjective:  ? ? Patient ID: Denise Elliott, female    DOB: 1961/05/27, 61 y.o.   MRN: [id number] ? ?Denise Elliott is a 61 y.o. female presenting on 03/25/2022 for Follow-up, Wrist Injury, and Panic Attack ? ? ?HPI ? ?Episodic Flare - constellation of symptoms ?GAD with Panic Attacks - suspected ?Atypical chest pain with tachycardia ?GERD ? ?2 episodes over weekend on Saturday. First episode while in the grocery store with panic attack, bilateral Arms tingling, heart palpitations, racing, nausea, sweating. Symptoms imp ? ?Admits some heartburn symptoms ? ?Father died of aneurysm. He was a smoker and alcoholic. Also mother had aneurysm. ? ?She has come off of Nortriptyline, weaned herself off. ? ?Duloxetine does not seem to help her arthritis pain significantly ?Taking Advil Ibuprofen '200mg'$ s taking 2-3 pills twice a day. ? ? ? ? ?  01/22/2022  ?  9:21 AM 09/06/2021  ?  8:28 AM 08/24/2020  ?  9:02 AM  ?Depression screen PHQ 2/9  ?Decreased Interest 0 0 0  ?Down, Depressed, Hopeless 1 0 0  ?PHQ - 2 Score 1 0 0  ?Altered sleeping 0 0   ?Tired, decreased energy 1 3   ?Change in appetite 0 1   ?Feeling bad or failure about yourself  0 0   ?Trouble concentrating 1 0   ?Moving slowly or fidgety/restless 0 0   ?Suicidal thoughts 0 0   ?PHQ-9 Score 3 4   ?Difficult doing work/chores Not difficult at all Somewhat difficult   ? ? ?Social History  ? ?Tobacco Use  ? Smoking status: Never  ? Smokeless tobacco: Never  ?Vaping Use  ? Vaping Use: Never used  ?Substance Use Topics  ? Alcohol use: Yes  ?  Comment: socially  ? Drug use: No  ? ? ?Review of Systems ?Per HPI unless specifically indicated above ? ?   ?Objective:  ?  ?BP 122/68   Pulse 84   Ht 5' 5.5" (1.664 m)   Wt 197 lb 6.4 oz (89.5 kg)   LMP 02/03/2020   SpO2 100%   BMI 32.35 kg/m?   ?Wt Readings from Last 3 Encounters:  ?03/25/22 197 lb 6.4 oz (89.5 kg)  ?01/22/22 199 lb 3.2 oz (90.4 kg)  ?09/06/21 198 lb 9.6 oz (90.1 kg)  ?  ?Physical Exam ?Vitals and nursing  note reviewed.  ?Constitutional:   ?   General: She is not in acute distress. ?   Appearance: Normal appearance. She is well-developed. She is not diaphoretic.  ?   Comments: Well-appearing, comfortable, cooperative  ?HENT:  ?   Head: Normocephalic and atraumatic.  ?Eyes:  ?   General:     ?   Right eye: No discharge.     ?   Left eye: No discharge.  ?   Conjunctiva/sclera: Conjunctivae normal.  ?Cardiovascular:  ?   Rate and Rhythm: Normal rate.  ?Pulmonary:  ?   Effort: Pulmonary effort is normal.  ?Skin: ?   General: Skin is warm and dry.  ?   Findings: No erythema or rash.  ?Neurological:  ?   Mental Status: She is alert and oriented to person, place, and time.  ?Psychiatric:     ?   Mood and Affect: Mood normal.     ?   Behavior: Behavior normal.     ?   Thought Content: Thought content normal.  ?   Comments: Well groomed, good eye contact, normal speech and thoughts  ? ? ? ?Results  for orders placed or performed in visit on 12/22/20  ?COMPLETE METABOLIC PANEL WITH GFR  ?Result Value Ref Range  ? Glucose, Bld 74 65 - 99 mg/dL  ? BUN 18 7 - 25 mg/dL  ? Creat 0.86 0.50 - 0.99 mg/dL  ? GFR, Est Non African American 73 > OR = 60 mL/min/1.17m  ? GFR, Est African American 85 > OR = 60 mL/min/1.764m ? BUN/Creatinine Ratio NOT APPLICABLE 6 - 22 (calc)  ? Sodium 138 135 - 146 mmol/L  ? Potassium 4.5 3.5 - 5.3 mmol/L  ? Chloride 102 98 - 110 mmol/L  ? CO2 28 20 - 32 mmol/L  ? Calcium 9.5 8.6 - 10.4 mg/dL  ? Total Protein 6.8 6.1 - 8.1 g/dL  ? Albumin 4.2 3.6 - 5.1 g/dL  ? Globulin 2.6 1.9 - 3.7 g/dL (calc)  ? AG Ratio 1.6 1.0 - 2.5 (calc)  ? Total Bilirubin 0.3 0.2 - 1.2 mg/dL  ? Alkaline phosphatase (APISO) 97 37 - 153 U/L  ? AST 15 10 - 35 U/L  ? ALT 15 6 - 29 U/L  ? ?   ?Assessment & Plan:  ? ?Problem List Items Addressed This Visit   ? ? Nausea  ? ?Other Visit Diagnoses   ? ? Generalized anxiety disorder with panic attacks    -  Primary  ? Heart palpitations      ? Relevant Medications  ? metoprolol tartrate  (LOPRESSOR) 25 MG tablet  ? Other Relevant Orders  ? Ambulatory referral to Cardiology  ? Tachycardia      ? Relevant Medications  ? metoprolol tartrate (LOPRESSOR) 25 MG tablet  ? Other Relevant Orders  ? Ambulatory referral to Cardiology  ? Family history of aortic aneurysm      ? Relevant Medications  ? metoprolol tartrate (LOPRESSOR) 25 MG tablet  ? Other Relevant Orders  ? Ambulatory referral to Cardiology  ? ?  ?  ?constellation of episodic symptoms with atypical chest pain heartburn, bilateral arm tingling, tachycardia heart racing with palpitations, strong family history of aneurysms aorta with father having fatal rupture, she has heart disease in family. Most likely explanation is with fibromyalgia chronic pain and anxiety panic, however, age [age]ith variety of symptoms and fam history - requesting cardiac evaluation first. ? ?Start Rx Metoprolol as needed ONLY for flare up with racing heart and palpitations, take one twice a day if you need. ? ?[facility] ?[place] [address]Main: [phone]? ? ?For Wrist Pain / Arthritis ?Recommend trial of increasing Anti-inflammatory with Ibuprofen '200mg'$  tabs - take up to 3 with food and plenty of water THREE times daily every day (breakfast and dinner), for now - can phase down if doing better. May try this for 2 to 4 weeks, then you may take only as needed ?- DO NOT TAKE any advil, aleve, motrin while you are taking this medicine ? ?- It is safe to take Tylenol Ext Str '500mg'$  tabs - take 1 to 2 (max dose '1000mg'$ ) every 6 hours as needed for breakthrough pain, max 24 hour daily dose is 6 to 8 tablets or '4000mg'$  ? ?Orders Placed This Encounter  ?Procedures  ? Ambulatory referral to Cardiology  ?  Referral Priority:   Routine  ?  Referral Type:   Consultation  ?  Referral Reason:   Specialty Services Required  ?  Requested Specialty:   Cardiology  ?  Number of Visits Requested:   1   ? ? ? ?Meds ordered this encounter  ?Medications  ? metoprolol tartrate (LOPRESSOR) 25 MG tablet  ?  Sig: Take 1 tablet (25 mg total) by mouth 2 (two) times daily as needed (tachycardia, palpitations).  ?  Dispense:  60 tablet  ?  Refill:  2  ? ? ? ? ?Follow up plan: ?Return in about 3 months (around 06/24/2022), or if symptoms worsen or fail to improve, for within 3 months follow up anxiety / fibro/arthritis pain, cardiology update. ? ? ?[name], DO ?[facility] ?[facility] ?03/25/2022, 11:17 AM ?

## 2022-03-26 ENCOUNTER — Ambulatory Visit: Payer: BC Managed Care – PPO | Admitting: Internal Medicine

## 2022-04-19 ENCOUNTER — Other Ambulatory Visit: Payer: Self-pay | Admitting: Family Medicine

## 2022-04-19 DIAGNOSIS — J0110 Acute frontal sinusitis, unspecified: Secondary | ICD-10-CM

## 2022-04-19 DIAGNOSIS — H6982 Other specified disorders of Eustachian tube, left ear: Secondary | ICD-10-CM

## 2022-04-19 NOTE — Telephone Encounter (Signed)
Rx 01/22/22 16g 3RF-too soon ?Requested Prescriptions  ?Pending Prescriptions Disp Refills  ?? fluticasone (FLONASE) 50 MCG/ACT nasal spray [Pharmacy Med Name: FLUTICASONE PROP 50 MCG SPRAY] 48 mL 1  ?  Sig: PLACE 2 SPRAYS INTO BOTH NOSTRILS DAILY. USE FOR 4-6 WEEKS THEN STOP AND USE SEASONALLY OR AS NEEDED  ?  ? Ear, Nose, and Throat: Nasal Preparations - Corticosteroids Passed - 04/19/2022  2:27 AM  ?  ?  Passed - Valid encounter within last 12 months  ?  Recent Outpatient Visits   ?      ? 3 weeks ago Generalized anxiety disorder with panic attacks  ? [place], DO  ? 2 months ago Subacute frontal sinusitis  ? [place], DO  ? 7 months ago Chronic pain of right wrist  ? [place], DO  ? 1 year ago Acute non-recurrent sinusitis, unspecified location  ? [place], FNP  ? 1 year ago Sinus congestion  ? [facility], Lupita Raider, FNP  ?  ?  ?Future Appointments   ?        ? In 2 weeks [place], Aaron Edelman, MD [facility]  ?  ? ?  ?  ?  ? ?

## 2022-05-06 ENCOUNTER — Ambulatory Visit: Payer: BC Managed Care – PPO | Admitting: Cardiology

## 2022-05-06 ENCOUNTER — Ambulatory Visit (INDEPENDENT_AMBULATORY_CARE_PROVIDER_SITE_OTHER): Payer: BC Managed Care – PPO

## 2022-05-06 ENCOUNTER — Telehealth: Payer: Self-pay

## 2022-05-06 ENCOUNTER — Encounter: Payer: Self-pay | Admitting: Cardiology

## 2022-05-06 ENCOUNTER — Other Ambulatory Visit
Admission: RE | Admit: 2022-05-06 | Discharge: 2022-05-06 | Disposition: A | Discharge status: Home / Self Care | Payer: BC Managed Care – PPO | Attending: Cardiology | Admitting: Cardiology

## 2022-05-06 VITALS — BP 130/84 | HR 82 | Ht 65.5 in | Wt 200.0 lb

## 2022-05-06 DIAGNOSIS — E7800 Pure hypercholesterolemia, unspecified: Secondary | ICD-10-CM | POA: Diagnosis not present

## 2022-05-06 DIAGNOSIS — R002 Palpitations: Secondary | ICD-10-CM | POA: Diagnosis not present

## 2022-05-06 DIAGNOSIS — R072 Precordial pain: Secondary | ICD-10-CM

## 2022-05-06 DIAGNOSIS — I10 Essential (primary) hypertension: Secondary | ICD-10-CM | POA: Diagnosis not present

## 2022-05-06 LAB — BASIC METABOLIC PANEL
Anion gap: 10 (ref 5–15)
BUN: 17 mg/dL (ref 8–23)
CO2: 26 mmol/L (ref 22–32)
Calcium: 9.3 mg/dL (ref 8.9–10.3)
Chloride: 101 mmol/L (ref 98–111)
Creatinine, Ser: 0.93 mg/dL (ref 0.44–1.00)
GFR, Estimated: >60 mL/min (ref >60)
Glucose, Bld: 105 mg/dL — ABNORMAL HIGH (ref 70–99)
Potassium: 4.5 mmol/L (ref 3.5–5.1)
Sodium: 137 mmol/L (ref 135–145)

## 2022-05-06 MED ORDER — METOPROLOL TARTRATE 100 MG PO TABS: 100.0000 mg | ORAL_TABLET | Freq: Once | ORAL | 0 refills | Status: DC

## 2022-05-06 MED ORDER — REPATHA SURECLICK 140 MG/ML ~~LOC~~ SOAJ: 1.0000 "pen " | SUBCUTANEOUS | 11 refills | Status: DC

## 2022-05-06 MED ORDER — IVABRADINE HCL 5 MG PO TABS
15.0000 mg | ORAL_TABLET | Freq: Once | ORAL | 0 refills | Status: AC
Start: 2022-05-06 — End: 2022-05-06

## 2022-05-06 NOTE — Patient Instructions (Signed)
Medication Instructions:   Your physician has recommended you make the following change in your medication:     START taking Repatha once every 14 days.  *If you need a refill on your cardiac medications before your next appointment, please call your pharmacy*   Lab Work:  Please go to the medical mall after your appointment today for a Lab (BMP) draw. The order has been entered for you.   Testing/Procedures:  Your physician has requested that you have an echocardiogram in 3 weeks. Echocardiography is a painless test that uses sound waves to create images of your heart. It provides your doctor with information about the size and shape of your heart and how well your heart's chambers and valves are working. This procedure takes approximately one hour. There are no restrictions for this procedure.  2.   Your physician has requested that you have cardiac CT. Cardiac computed tomography (CT) is a painless test that uses an x-ray machine to take clear, detailed pictures of your heart.   Your cardiac CT will be scheduled at:  [facility] [address] [place], [place] [address] [phone]  Please arrive 15 mins early for check-in and test prep.    Please follow these instructions carefully (unless otherwise directed):    On the Night Before the Test: Be sure to Drink plenty of water. Do not consume any caffeinated/decaffeinated beverages or chocolate 12 hours prior to your test.   On the Day of the Test: Drink plenty of water until 1 hour prior to the test. Do not eat any food 4 hours prior to the test. You may take your regular medications prior to the test.  Take metoprolol (Lopressor) 100 MG two hours prior to test. Take Ivabradine (Corlanor) 15 MG two hours prior to test. DO NOT TAKE your Hydrochlorothiazide morning of the test. FEMALES- please wear underwire-free bra if available, avoid dresses & tight clothing        After the Test: Drink plenty of water. After receiving IV contrast, you may experience a mild flushed feeling. This is normal. On occasion, you may experience a mild rash up to 24 hours after the test. This is not dangerous. If this occurs, you can take Benadryl 25 mg and increase your fluid intake. If you experience trouble breathing, this can be serious. If it is severe call 911 IMMEDIATELY. If it is mild, please call our office. If you take any of these medications: Glipizide/Metformin, Avandament, Glucavance, please do not take 48 hours after completing test unless otherwise instructed.  Please allow 2-4 weeks for scheduling of routine cardiac CTs. Some insurance companies require a pre-authorization which may delay scheduling of this test.   For non-scheduling related questions, please contact the cardiac imaging nurse navigator should you have any questions/concerns: Marchia Bond, Cardiac Imaging Nurse Navigator Gordy Clement, Cardiac Imaging Nurse Navigator [facility] Direct Office Dial: [phone]   For scheduling needs, including cancellations and rescheduling, please call [place], [phone].    3.   Your physician has recommended that you wear a Zio XT monitor for 2 weeks. This will be mailed to your home address in 4-5 business days.   This monitor is a medical device that records the heart's electrical activity. Doctors most often use these monitors to diagnose arrhythmias. Arrhythmias are problems with the speed or rhythm of the heartbeat. The monitor is a small device applied to your chest. You can wear one while you do  your normal daily activities. While wearing this monitor if you have any symptoms to push the button and record what you felt. Once you have worn this monitor for the period of time provider prescribed (Usually 14 days), you will return the monitor device in the postage paid box. Once it is returned they will download the data  collected and provide [place] with a report which the provider will then review and we will call you with those results. Important tips:  Avoid showering during the first 24 hours of wearing the monitor. Avoid excessive sweating to help maximize wear time. Do not submerge the device, no hot tubs, and no swimming pools. Keep any lotions or oils away from the patch. After 24 hours you may shower with the patch on. Take brief showers with your back facing the shower head.  Do not remove patch once it has been placed because that will interrupt data and decrease adhesive wear time. Push the button when you have any symptoms and write down what you were feeling. Once you have completed wearing your monitor, remove and place into box which has postage paid and place in your outgoing mailbox.  If for some reason you have misplaced your box then call our office and we can provide another box and/or mail it off for you.   Follow-Up: At [facility], you and your health needs are our priority.  As part of our continuing mission to provide you with exceptional heart care, we have created designated Provider Care Teams.  These Care Teams include your primary Cardiologist (physician) and Advanced Practice Providers (APPs -  Physician Assistants and Nurse Practitioners) who all work together to provide you with the care you need, when you need it.   We recommend signing up for the patient portal called "MyChart".  Sign up information is provided on this After Visit Summary.  MyChart is used to connect with patients for Virtual Visits (Telemedicine).  Patients are able to view lab/test results, encounter notes, upcoming appointments, etc.  Non-urgent messages can be sent to your provider as well.   To learn more about what you can do with MyChart, go to [email or web address].    Your next appointment:   8 week(s)  The format for your next appointment:   In Person  Provider:   Kate Sable, MD     Other Instructions   Important Information About Sugar

## 2022-05-06 NOTE — Progress Notes (Signed)
Cardiology Office Note:    Date:  05/06/2022   ID:  Denise Elliott, DOB 1960-12-24, MRN [id number]  PCP:  Olin Hauser, DO   [place] Providers Cardiologist:  None     Referring MD: [name] *   Chief Complaint  Patient presents with   NEW patient-Evaluation of palpitations    Patient reports having 2 episodes in the same day of nausea, sweating, weakness, and feeling her heart race. She also states her arms were tingling and she felt a lot of pressure in her upper back. Symptoms lasted approx 15 min each time.    Denise Elliott is a 61 y.o. female who is being seen today for the evaluation of palpitations at the request of [name] *.   History of Present Illness:    Denise Elliott is a 61 y.o. female with a hx of hypertension, hyperlipidemia, anxiety, fibromyalgia who presents due to palpitations.  Patient states having symptoms of palpitations ongoing for the past 2 months or so.  Initially felt heart rate racing last month around Easter.  Symptoms went on for 30 minutes, associated with diaphoresis.  She also complains of chest pressure and neck tightness.  Chest pain is reproducible with palpation.  Also has back pain.  Has a history of hyperlipidemia, previously tried multiple statins did not tolerate.  Not on any cholesterol medicines at this time.  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   Plantar fasciitis    Sleep apnea    used CPAP, lost 60 pounds no issue now   Tinnitus    wears hearing aids   Varicose veins of both legs with edema    Vertigo    inner ear imbalance    Past Surgical History:  Procedure Laterality Date   CHOLECYSTECTOMY     COLONOSCOPY WITH PROPOFOL N/A 05/26/2020   Procedure: COLONOSCOPY WITH PROPOFOL;  Surgeon: Lucilla Lame, MD;  Location: [place];  Service: Endoscopy;  Laterality: N/A;  priority 4   COSMETIC SURGERY Left    broken cheek bone   GALLBLADDER SURGERY     POLYPECTOMY  05/26/2020   Procedure: POLYPECTOMY;  Surgeon: Lucilla Lame, MD;  Location: [place];  Service: Endoscopy;;   TONSILLECTOMY     TONSILLECTOMY      Current Medications: Current Meds  Medication Sig   albuterol (PROAIR HFA) 108 (90 Base) MCG/ACT inhaler Inhale 1-2 puffs into the lungs every 6 (six) hours as needed for wheezing or shortness of breath.   DULoxetine (CYMBALTA) 60 MG capsule TAKE 1 CAPSULE BY MOUTH EVERY DAY   Evolocumab (REPATHA SURECLICK) 295 MG/ML SOAJ Inject 1 pen. into  the skin every 14 (fourteen) days.   fluticasone (FLONASE) 50 MCG/ACT nasal spray Place 2 sprays into both nostrils daily. Use for 4-6 weeks then stop and use seasonally or as needed.   hydrochlorothiazide (HYDRODIURIL) 12.5 MG tablet Take 1 tablet (12.5 mg total) by mouth daily.   ivabradine (CORLANOR) 5 MG TABS tablet Take 3 tablets (15 mg total) by mouth once for 1 dose. Take 2 hours prior to your CT scan.   lisinopril (ZESTRIL) 10 MG tablet Take 1 tablet (10 mg total) by mouth daily.   meclizine (ANTIVERT) 12.5 MG tablet Take 12.5 mg by mouth 3 (three) times daily as needed.   metoprolol tartrate (LOPRESSOR) 100 MG tablet Take 1 tablet (100 mg total) by mouth once for 1 dose. Take 2 hours prior to your CT scan.   nortriptyline (PAMELOR) 25 MG capsule Take 25-50 mg  by mouth at bedtime.   omeprazole (PRILOSEC) 10 MG capsule Take 1 capsule (10 mg total) by mouth daily.     Allergies:   Versed [midazolam], Grapeseed extract [nutritional supplements], and Proanthocyanidin   Social History   Socioeconomic History   Marital status: Married    Spouse name: Not on file   Number of children: Not on file   Years of education: Not on file   Highest education level: Not on file  Occupational History   Not on file  Tobacco Use   Smoking status: Never   Smokeless tobacco: Never  Vaping Use   Vaping Use: Never used  Substance and Sexual Activity   Alcohol use: Yes    Comment: socially   Drug use: No   Sexual activity: Yes    Birth control/protection: Post-menopausal  Other Topics Concern   Not on file  Social History Narrative   Not on file   Social Determinants of Health   Financial Resource Strain: Not on file  Food Insecurity: Not on file  Transportation Needs: Not on file  Physical Activity: Not on file  Stress: Not on file  Social Connections: Not on file     Family History: The patient's family history includes AAA (abdominal aortic aneurysm) in her father; Alcohol abuse in her father and paternal grandfather; Breast cancer (age of onset: 34) in her sister; Colon cancer in her maternal uncle; Colon polyps in her mother; Goiter in her mother; Healthy in her daughter, sister, sister, sister, son, and son; Hypertension in her mother; Liver cancer in her paternal grandfather; Vision loss in her maternal grandmother. There is no history of Bladder Cancer, Kidney cancer, or Prostate cancer.  ROS:   Please see the history of present illness.     All other systems reviewed and are negative.  EKGs/Labs/Other Studies Reviewed:    The following studies were reviewed today:   EKG:  EKG is  ordered today.  The ekg ordered today demonstrates sinus rhythm.  Recent Labs: No results found for requested labs within last 8760 hours.  Recent Lipid  Panel    Component Value Date/Time   CHOL 268 (H) 04/27/2020 0825   TRIG 68 04/27/2020 0825   HDL 107 04/27/2020 0825   CHOLHDL 2.5 04/27/2020 0825   VLDL 36 (H) 07/08/2017 1125   LDLCALC 145 (H) 04/27/2020 0825     Risk Assessment/Calculations:          Physical Exam:    VS:  BP 130/84 (BP Location: Right Arm, Patient Position: Sitting, Cuff Size: Large)   Pulse 82   Ht 5' 5.5" (1.664 m)  Wt 200 lb (90.7 kg)   LMP 02/03/2020   SpO2 98%   BMI 32.78 kg/m     Wt Readings from Last 3 Encounters:  05/06/22 200 lb (90.7 kg)  03/25/22 197 lb 6.4 oz (89.5 kg)  01/22/22 199 lb 3.2 oz (90.4 kg)     GEN:  Well nourished, well developed in no acute distress HEENT: Normal NECK: No JVD; No carotid bruits LYMPHATICS: No lymphadenopathy CARDIAC: RRR, no murmurs, rubs, gallops RESPIRATORY:  Clear to auscultation without rales, wheezing or rhonchi  ABDOMEN: Soft, non-tender, non-distended MUSCULOSKELETAL:  No edema; mild left chest tenderness with palpation SKIN: Warm and dry NEUROLOGIC:  Alert and oriented x 3 PSYCHIATRIC:  Normal affect   ASSESSMENT:    1. Precordial pain   2. Primary hypertension   3. Pure hypercholesterolemia   4. Palpitations    PLAN:    In order of problems listed above:  Chest pain, risk factors hypertension, hyperlipidemia.  Get echocardiogram, get coronary CTA.  Has a component of musculoskeletal chest discomfort reproducible with palpation.  Will reassure patient if coronary CTA has no significant CAD. Hypertension, BP controlled.  Continue lisinopril, HCTZ. Hyperlipidemia, not tolerant to statins.  Start Repatha. Palpitations, place cardiac monitor to evaluate any significant arrhythmias.  Follow-up in 6 weeks after cardiac testing.      Medication Adjustments/Labs and Tests Ordered: Current medicines are reviewed at length with the patient today.  Concerns regarding medicines are outlined above.  Orders Placed This Encounter   Procedures   CT CORONARY MORPH W/CTA COR W/SCORE W/CA W/CM &/OR WO/CM   Basic metabolic panel   LONG TERM MONITOR (3-14 DAYS)   EKG 12-Lead   ECHOCARDIOGRAM COMPLETE   Meds ordered this encounter  Medications   Evolocumab (REPATHA SURECLICK) 735 MG/ML SOAJ    Sig: Inject 1 pen. into the skin every 14 (fourteen) days.    Dispense:  2 mL    Refill:  11   metoprolol tartrate (LOPRESSOR) 100 MG tablet    Sig: Take 1 tablet (100 mg total) by mouth once for 1 dose. Take 2 hours prior to your CT scan.    Dispense:  1 tablet    Refill:  0   ivabradine (CORLANOR) 5 MG TABS tablet    Sig: Take 3 tablets (15 mg total) by mouth once for 1 dose. Take 2 hours prior to your CT scan.    Dispense:  3 tablet    Refill:  0    Patient Instructions  Medication Instructions:   Your physician has recommended you make the following change in your medication:     START taking Repatha once every 14 days.  *If you need a refill on your cardiac medications before your next appointment, please call your pharmacy*   Lab Work:  Please go to the medical mall after your appointment today for a Lab (BMP) draw. The order has been entered for you.   Testing/Procedures:  Your physician has requested that you have an echocardiogram in 3 weeks. Echocardiography is a painless test that uses sound waves to create images of your heart. It provides your doctor with information about the size and shape of your heart and how well your heart's chambers and valves are working. This procedure takes approximately one hour. There are no restrictions for this procedure.  2.   Your physician has requested that you have cardiac CT. Cardiac computed tomography (CT) is a painless test that uses an x-ray machine to take clear, detailed  pictures of your heart.   Your cardiac CT will be scheduled at:  [facility] [address] [place], [place] [address] [phone]  Please  arrive 15 mins early for check-in and test prep.    Please follow these instructions carefully (unless otherwise directed):    On the Night Before the Test: Be sure to Drink plenty of water. Do not consume any caffeinated/decaffeinated beverages or chocolate 12 hours prior to your test.   On the Day of the Test: Drink plenty of water until 1 hour prior to the test. Do not eat any food 4 hours prior to the test. You may take your regular medications prior to the test.  Take metoprolol (Lopressor) 100 MG two hours prior to test. Take Ivabradine (Corlanor) 15 MG two hours prior to test. DO NOT TAKE your Hydrochlorothiazide morning of the test. FEMALES- please wear underwire-free bra if available, avoid dresses & tight clothing       After the Test: Drink plenty of water. After receiving IV contrast, you may experience a mild flushed feeling. This is normal. On occasion, you may experience a mild rash up to 24 hours after the test. This is not dangerous. If this occurs, you can take Benadryl 25 mg and increase your fluid intake. If you experience trouble breathing, this can be serious. If it is severe call 911 IMMEDIATELY. If it is mild, please call our office. If you take any of these medications: Glipizide/Metformin, Avandament, Glucavance, please do not take 48 hours after completing test unless otherwise instructed.  Please allow 2-4 weeks for scheduling of routine cardiac CTs. Some insurance companies require a pre-authorization which may delay scheduling of this test.   For non-scheduling related questions, please contact the cardiac imaging nurse navigator should you have any questions/concerns: Marchia Bond, Cardiac Imaging Nurse Navigator Gordy Clement, Cardiac Imaging Nurse Navigator [facility] Direct Office Dial: [phone]   For scheduling needs, including cancellations and rescheduling, please call [place], [phone].    3.   Your  physician has recommended that you wear a Zio XT monitor for 2 weeks. This will be mailed to your home address in 4-5 business days.   This monitor is a medical device that records the heart's electrical activity. Doctors most often use these monitors to diagnose arrhythmias. Arrhythmias are problems with the speed or rhythm of the heartbeat. The monitor is a small device applied to your chest. You can wear one while you do your normal daily activities. While wearing this monitor if you have any symptoms to push the button and record what you felt. Once you have worn this monitor for the period of time provider prescribed (Usually 14 days), you will return the monitor device in the postage paid box. Once it is returned they will download the data collected and provide [place] with a report which the provider will then review and we will call you with those results. Important tips:  Avoid showering during the first 24 hours of wearing the monitor. Avoid excessive sweating to help maximize wear time. Do not submerge the device, no hot tubs, and no swimming pools. Keep any lotions or oils away from the patch. After 24 hours you may shower with the patch on. Take brief showers with your back facing the shower head.  Do not remove patch once it has been placed because that will interrupt data and decrease adhesive wear time. Push the button when you have any  symptoms and write down what you were feeling. Once you have completed wearing your monitor, remove and place into box which has postage paid and place in your outgoing mailbox.  If for some reason you have misplaced your box then call our office and we can provide another box and/or mail it off for you.   Follow-Up: At [facility], you and your health needs are our priority.  As part of our continuing mission to provide you with exceptional heart care, we have created designated Provider Care Teams.  These Care Teams include your primary Cardiologist  (physician) and Advanced Practice Providers (APPs -  Physician Assistants and Nurse Practitioners) who all work together to provide you with the care you need, when you need it.   We recommend signing up for the patient portal called "MyChart".  Sign up information is provided on this After Visit Summary.  MyChart is used to connect with patients for Virtual Visits (Telemedicine).  Patients are able to view lab/test results, encounter notes, upcoming appointments, etc.  Non-urgent messages can be sent to your provider as well.   To learn more about what you can do with MyChart, go to [email or web address].    Your next appointment:   8 week(s)  The format for your next appointment:   In Person  Provider:   Kate Sable, MD    Other Instructions   Important Information About Sugar         Signed, Kate Sable, MD  05/06/2022 10:35 AM    [place]

## 2022-05-07 NOTE — Telephone Encounter (Signed)
Received fax from [facility] in [place], indicating that repatha is not covered by patients insurance and they are requesting an alternative.

## 2022-05-08 ENCOUNTER — Telehealth (HOSPITAL_COMMUNITY): Payer: Self-pay | Admitting: Emergency Medicine

## 2022-05-08 MED ORDER — PRALUENT 150 MG/ML ~~LOC~~ SOAJ: 1.0000 "pen " | SUBCUTANEOUS | 11 refills | Status: DC

## 2022-05-08 NOTE — Telephone Encounter (Signed)
Attempted to call patient regarding upcoming cardiac CT appointment. °Left message on voicemail with name and callback number °Leonte Horrigan RN Navigator Cardiac Imaging °[facility] °[phone] Office °[phone] Cell ° °

## 2022-05-08 NOTE — Telephone Encounter (Signed)
Sent an RX for Praluent to try and get approved for patient.

## 2022-05-09 ENCOUNTER — Ambulatory Visit
Admission: RE | Admit: 2022-05-09 | Discharge: 2022-05-09 | Disposition: A | Discharge status: Home / Self Care | Payer: BC Managed Care – PPO | Source: Ambulatory Visit | Attending: Cardiology | Admitting: Cardiology

## 2022-05-09 DIAGNOSIS — R072 Precordial pain: Secondary | ICD-10-CM | POA: Insufficient documentation

## 2022-05-09 DIAGNOSIS — R002 Palpitations: Secondary | ICD-10-CM | POA: Diagnosis not present

## 2022-05-09 MED ORDER — NITROGLYCERIN 0.4 MG SL SUBL
0.8000 mg | SUBLINGUAL_TABLET | Freq: Once | SUBLINGUAL | Status: AC
  Administered 2022-05-09: 0.8 mg via SUBLINGUAL

## 2022-05-09 MED ORDER — IOHEXOL 350 MG/ML SOLN
100.0000 mL | Freq: Once | INTRAVENOUS | Status: AC | PRN
  Administered 2022-05-09: 100 mL via INTRAVENOUS

## 2022-05-09 NOTE — Progress Notes (Signed)
Patient tolerated procedure well. Ambulate w/o difficulty. Denies light headedness or being dizzy. Sitting in chair drinking water provided. Encouraged to drink extra water today and reasoning explained. Verbalized understanding. All questions answered. ABC intact. No further needs. Discharge from procedure area w/o issues.   °

## 2022-05-20 ENCOUNTER — Other Ambulatory Visit: Payer: Self-pay | Admitting: Family Medicine

## 2022-05-20 DIAGNOSIS — M797 Fibromyalgia: Secondary | ICD-10-CM

## 2022-05-21 NOTE — Telephone Encounter (Signed)
Requested medication (s) are due for refill today:   Not sure  Requested medication (s) are on the active medication list:   Yes as a historical med  Future visit scheduled:   No   Last ordered: Unknown  Returned because this was prescribed by cardiology Dr. Kate Sable.     Requested Prescriptions  Pending Prescriptions Disp Refills   nortriptyline (PAMELOR) 25 MG capsule [Pharmacy Med Name: NORTRIPTYLINE HCL 25 MG CAP] 180 capsule 1    Sig: TAKE 2 CAPSULES (50 MG TOTAL) BY MOUTH EVERY DAY AT BEDTIME     Psychiatry:  Antidepressants - Heterocyclics (TCAs) Passed - 05/20/2022  1:51 AM      Passed - Completed PHQ-2 or PHQ-9 in the last 360 days      Passed - Valid encounter within last 6 months    Recent Outpatient Visits           1 month ago Generalized anxiety disorder with panic attacks   [place], DO   3 months ago Subacute frontal sinusitis   [place], DO   8 months ago Chronic pain of right wrist   [place], DO   1 year ago Acute non-recurrent sinusitis, unspecified location   [place], FNP   1 year ago Sinus congestion   [facility], Lupita Raider, FNP       Future Appointments             In 1 month [place], Aaron Edelman, MD [facility] [place]

## 2022-06-04 ENCOUNTER — Ambulatory Visit: Payer: BC Managed Care – PPO | Admitting: Family Medicine

## 2022-06-04 ENCOUNTER — Encounter: Payer: Self-pay | Admitting: Family Medicine

## 2022-06-04 VITALS — BP 133/74 | HR 83 | Ht 65.5 in | Wt 194.0 lb

## 2022-06-04 DIAGNOSIS — R110 Nausea: Secondary | ICD-10-CM

## 2022-06-04 DIAGNOSIS — G43909 Migraine, unspecified, not intractable, without status migrainosus: Secondary | ICD-10-CM | POA: Diagnosis not present

## 2022-06-04 MED ORDER — ONDANSETRON 4 MG PO TBDP: 4.0000 mg | ORAL_TABLET | Freq: Three times a day (TID) | ORAL | 0 refills | Status: AC | PRN

## 2022-06-04 MED ORDER — RIZATRIPTAN BENZOATE 10 MG PO TBDP: 10.0000 mg | ORAL_TABLET | ORAL | 2 refills | Status: DC | PRN

## 2022-06-04 NOTE — Patient Instructions (Addendum)
Thank you for coming to the office today.  Start with Denise Elliott   Nurtec ODT new medicine we will order if this version is not successful. Both dissolve and work quickly  1. You most likely have Chronic Migraine Headaches - Migraine headaches present differently for many patients, pain is usually throbbing or aching, often on one side of head or behind the eye. They tend to last for up to hours or days. In treating migraines, our goal is to 1) stop the headache and 2) prevent recurrence of headaches  Treatment to STOP the headache at this time: - Start with Rizatriptan '10mg'$  - take 1 immediately at onset of moderate to severe migraine headache, if unresolved or return within 2 hours then repeat dose 1 tablet, that is max dose for 24 hours. (Note - this medication can cause a brief episode of flushing and chest pressure or pain very soon after taking it. That is NORMAL, and it is the medicine taking effect and dilating some blood vessels. It should pass, and resolve within seconds to minutes after - it may not happen at all)  - Try this for 1-2 weeks, if absolutely NOT helping then contact me to discuss changes, possibly can double sumatriptan dose or try nasal spray - You can still take over the counter meds with Ibuprofen up to 600-'800mg'$  per dose 3 times a day with food for a few days and may try Excedrin Migraine as needed, ONLY use these after you have tried Rizatriptan up to 2 doses, and try to limit their use if they are not effective  Treatment to PREVENT headaches: - Goal is to avoid triggers. We need to learn more details on what are your exact or possible headache triggers first. - Keep detailed headache diary (on printed handout) for possible triggers, bring this to your next visit to discuss further - Known possible triggers include caffeine, chocolate, alcohol, stress, weather changes, menstrual cycle, certain other foods - Also be aware that OTC pain meds/anti-inflammatories can  cause rebound headache, they help resolve the headache but then after the effect wears off they can CAUSE a headache. Try to taper down and stop these medications and allow them to get out of your system for 1-2 weeks   Look into some of these alternatives for Migraine Headache Prophylaxis or preventative treatment  Antidepressant Type - Venlafaxine, Amitriptyline Blood pressure meds - Metoprolol, Propanolol, Verapamil Anti Headache/Seizure/Nerve medications - Topamax, Gabapentin  Please schedule a Follow-up Appointment to: Return if symptoms worsen or fail to improve.  If you have any other questions or concerns, please feel free to call the office or send a message through [place]. You may also schedule an earlier appointment if necessary.  Additionally, you may be receiving a survey about your experience at our office within a few days to 1 week by e-mail or mail. We value your feedback.  [name], DO [place]

## 2022-06-04 NOTE — Progress Notes (Signed)
Subjective:    Patient ID: Denise Elliott, female    DOB: 30-Mar-1961, 61 y.o.   MRN: [id number]  Denise Elliott is a 61 y.o. female presenting on 06/04/2022 for Blurred Vision and Headache   HPI  Headaches, Suspected Episodic Migraine Reports recurrent headaches over the course of 1 month. Headache can last 3-4 days at a time, temporary relief, usually only relief from resting in dark room. Taking Excedrin migraine PRN, may take a few days for headache to go away. Admits eye pain and blurred vision, impacting her vision. Admits some nausea associated no vomiting Past history on Nortriptyline  Previously has seen Dr Manuella Ghazi for inner ear imbalance 2020  Has upcoming Cardiology evaluation, no further palpitations. Has ECHO.       06/04/2022    9:50 AM 01/22/2022    9:21 AM 09/06/2021    8:28 AM  Depression screen PHQ 2/9  Decreased Interest 0 0 0  Down, Depressed, Hopeless 0 1 0  PHQ - 2 Score 0 1 0  Altered sleeping 0 0 0  Tired, decreased energy '1 1 3  '$ Change in appetite 0 0 1  Feeling bad or failure about yourself  0 0 0  Trouble concentrating 1 1 0  Moving slowly or fidgety/restless 0 0 0  Suicidal thoughts 0 0 0  PHQ-9 Score '2 3 4  '$ Difficult doing work/chores Not difficult at all Not difficult at all Somewhat difficult    Social History   Tobacco Use   Smoking status: Never   Smokeless tobacco: Never  Vaping Use   Vaping Use: Never used  Substance Use Topics   Alcohol use: Yes    Comment: socially   Drug use: No    Review of Systems Per HPI unless specifically indicated above     Objective:    BP 133/74   Pulse 83   Ht 5' 5.5" (1.664 m)   Wt 194 lb (88 kg)   LMP 02/03/2020   SpO2 100%   BMI 31.79 kg/m   Wt Readings from Last 3 Encounters:  06/04/22 194 lb (88 kg)  05/06/22 200 lb (90.7 kg)  03/25/22 197 lb 6.4 oz (89.5 kg)    Physical Exam Vitals and nursing note reviewed.  Constitutional:      General: She is not in acute distress.     Appearance: She is well-developed. She is not diaphoretic.     Comments: Well-appearing, comfortable, cooperative  HENT:     Head: Normocephalic and atraumatic.  Eyes:     General:        Right eye: No discharge.        Left eye: No discharge.     Conjunctiva/sclera: Conjunctivae normal.  Neck:     Thyroid: No thyromegaly.  Cardiovascular:     Rate and Rhythm: Normal rate and regular rhythm.     Heart sounds: Normal heart sounds. No murmur heard. Pulmonary:     Effort: Pulmonary effort is normal. No respiratory distress.     Breath sounds: Normal breath sounds. No wheezing or rales.  Musculoskeletal:        General: Normal range of motion.     Cervical back: Normal range of motion and neck supple.  Lymphadenopathy:     Cervical: No cervical adenopathy.  Skin:    General: Skin is warm and dry.     Findings: No erythema or rash.  Neurological:     Mental Status: She is alert and oriented to person,  place, and time.  Psychiatric:        Behavior: Behavior normal.     Comments: Well groomed, good eye contact, normal speech and thoughts      Results for orders placed or performed during the hospital encounter of 50/93/26  Basic metabolic panel  Result Value Ref Range   Sodium 137 135 - 145 mmol/L   Potassium 4.5 3.5 - 5.1 mmol/L   Chloride 101 98 - 111 mmol/L   CO2 26 22 - 32 mmol/L   Glucose, Bld 105 (H) 70 - 99 mg/dL   BUN 17 8 - 23 mg/dL   Creatinine, Ser 0.93 0.44 - 1.00 mg/dL   Calcium 9.3 8.9 - 10.3 mg/dL   GFR, Estimated >60 >60 mL/min   Anion gap 10 5 - 15      Assessment & Plan:   Problem List Items Addressed This Visit     Nausea   Relevant Medications   ondansetron (ZOFRAN-ODT) 4 MG disintegrating tablet   Other Visit Diagnoses     Episodic migraine    -  Primary   Relevant Medications   rizatriptan (MAXALT-MLT) 10 MG disintegrating tablet   Other Relevant Orders   Ambulatory referral to Neurology       Consistent with episodic migraine HA new  diagnosis 4-14 days per month, with persistent episodes for few days - Currently without active HA, well-appearing, no focal neuro deficits, tolerating PO w/o n/v - Inadequately treated for migraine HA at home  Plan: 1. Start abortive therapy with Rizatriptan '10mg'$  ODT take 1 PRN (#10, 0 refill due to quantity limit), severe HA, may repeat dose within 2 hr if persistent, no more in 24 hours, in future can titrate dose to 50-'100mg'$  if needed. Counseling on potential side effect / intolerance with chest discomfort acutely after taking sumatriptan 2. May take Excedrin migraine PRN 3. Avoid triggers including foods, caffeine. Important to rest. Continue SNRI duloxetine can help prevent future migraines Return criteria given for acute migraine, when to go to office vs ED  Will consider Nurtec ODT vs Ubrelvy abortive next line if indicated  Referral back to Neurology [facility] Dr Manuella Ghazi - follow up on migraines and further diagnostic approach   Orders Placed This Encounter  Procedures   Ambulatory referral to Neurology    Referral Priority:   Routine    Referral Type:   Consultation    Referral Reason:   Specialty Services Required    Requested Specialty:   Neurology    Number of Visits Requested:   1     Meds ordered this encounter  Medications   rizatriptan (MAXALT-MLT) 10 MG disintegrating tablet    Sig: Take 1 tablet (10 mg total) by mouth as needed for migraine. May repeat in 2 hours if needed    Dispense:  10 tablet    Refill:  2   ondansetron (ZOFRAN-ODT) 4 MG disintegrating tablet    Sig: Take 1 tablet (4 mg total) by mouth every 8 (eight) hours as needed for nausea or vomiting.    Dispense:  30 tablet    Refill:  0      Follow up plan: Return if symptoms worsen or fail to improve.  [name], [facility] 06/04/2022, 9:59 AM

## 2022-06-13 ENCOUNTER — Ambulatory Visit (INDEPENDENT_AMBULATORY_CARE_PROVIDER_SITE_OTHER): Payer: BC Managed Care – PPO

## 2022-06-13 DIAGNOSIS — R072 Precordial pain: Secondary | ICD-10-CM | POA: Diagnosis not present

## 2022-06-13 LAB — ECHOCARDIOGRAM COMPLETE
AR max vel: 1.96 cm2
AV Area VTI: 2.22 cm2
AV Area mean vel: 2.21 cm2
AV Mean grad: 2 mmHg
AV Peak grad: 4.5 mmHg
Ao pk vel: 1.06 m/s
Area-P 1/2: 5.34 cm2
S' Lateral: 2.5 cm

## 2022-06-19 ENCOUNTER — Other Ambulatory Visit: Payer: Self-pay | Admitting: Family Medicine

## 2022-06-19 DIAGNOSIS — Z8249 Family history of ischemic heart disease and other diseases of the circulatory system: Secondary | ICD-10-CM

## 2022-06-19 DIAGNOSIS — R002 Palpitations: Secondary | ICD-10-CM

## 2022-06-19 DIAGNOSIS — R000 Tachycardia, unspecified: Secondary | ICD-10-CM

## 2022-06-19 NOTE — Telephone Encounter (Signed)
D/C 05/06/22. Requested Prescriptions  Refused Prescriptions Disp Refills  . metoprolol tartrate (LOPRESSOR) 25 MG tablet [Pharmacy Med Name: METOPROLOL TARTRATE 25 MG TAB] 180 tablet     Sig: TAKE 1 TABLET (25 MG TOTAL) BY MOUTH 2 (TWO) TIMES DAILY AS NEEDED (TACHYCARDIA, PALPITATIONS).     Cardiovascular:  Beta Blockers Passed - 06/19/2022  1:40 AM      Passed - Last BP in normal range    BP Readings from Last 1 Encounters:  06/04/22 133/74         Passed - Last Heart Rate in normal range    Pulse Readings from Last 1 Encounters:  06/04/22 83         Passed - Valid encounter within last 6 months    Recent Outpatient Visits          2 weeks ago Episodic migraine   [place], DO   2 months ago Generalized anxiety disorder with panic attacks   [place], DO   4 months ago Subacute frontal sinusitis   [place], DO   9 months ago Chronic pain of right wrist   [place], DO   1 year ago Acute non-recurrent sinusitis, unspecified location   [place], FNP      Future Appointments            In 1 week Agbor-Etang, Aaron Edelman, MD [facility], [place]   In 4 weeks [place], Devonne Doughty, [facility], [facility]

## 2022-06-25 ENCOUNTER — Encounter: Payer: Self-pay | Admitting: Family Medicine

## 2022-06-25 DIAGNOSIS — G43909 Migraine, unspecified, not intractable, without status migrainosus: Secondary | ICD-10-CM

## 2022-06-25 MED ORDER — RIZATRIPTAN BENZOATE 10 MG PO TBDP: 10.0000 mg | ORAL_TABLET | ORAL | 2 refills | Status: DC | PRN

## 2022-07-01 ENCOUNTER — Encounter: Payer: Self-pay | Admitting: Cardiology

## 2022-07-01 ENCOUNTER — Ambulatory Visit: Payer: BC Managed Care – PPO | Admitting: Cardiology

## 2022-07-01 VITALS — BP 122/82 | HR 88 | Ht 65.0 in | Wt 191.0 lb

## 2022-07-01 DIAGNOSIS — E7800 Pure hypercholesterolemia, unspecified: Secondary | ICD-10-CM

## 2022-07-01 DIAGNOSIS — I10 Essential (primary) hypertension: Secondary | ICD-10-CM

## 2022-07-01 DIAGNOSIS — R072 Precordial pain: Secondary | ICD-10-CM

## 2022-07-01 DIAGNOSIS — R002 Palpitations: Secondary | ICD-10-CM | POA: Diagnosis not present

## 2022-07-01 NOTE — Progress Notes (Signed)
Cardiology Office Note:    Date:  07/01/2022   ID:  Denise Elliott, DOB 11-Sep-1961, MRN [id number]  PCP:  Denise Hauser, DO   [place] Providers Cardiologist:  None     Referring MD: [name] *   Chief Complaint  Patient presents with   Follow-up    8 week follow up. Patient states that she feels fine today. Meds reviewed with patient.     History of Present Illness:    Denise Elliott is a 61 y.o. female with a hx of hypertension, hyperlipidemia, anxiety, fibromyalgia who presents for follow-up, previously seen due to chest pain and palpitations.   Echo and coronary CTA were obtained to evaluate cardiac function.  Cardiac monitor also placed to evaluate any significant arrhythmias.  Chest pain appears atypical, some reproducibility with palpation, generalized back pain also noted.  Started on Repatha due to statin intolerance, but PCSK9 was not approved.  Plans to follow-up next month with PCP for repeat cholesterol lab work.  She states feeling okay since last visit.  No new concerns or complaints.    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   Plantar fasciitis    Sleep apnea    used CPAP, lost 60 pounds no issue now   Tinnitus    wears hearing aids   Varicose veins of both legs with edema    Vertigo    inner  ear imbalance    Past Surgical History:  Procedure Laterality Date   CHOLECYSTECTOMY     COLONOSCOPY WITH PROPOFOL N/A 05/26/2020   Procedure: COLONOSCOPY WITH PROPOFOL;  Surgeon: Lucilla Lame, MD;  Location: [place];  Service: Endoscopy;  Laterality: N/A;  priority 4   COSMETIC SURGERY Left    broken cheek bone   GALLBLADDER SURGERY     POLYPECTOMY  05/26/2020   Procedure: POLYPECTOMY;  Surgeon: Lucilla Lame, MD;  Location: [place];  Service: Endoscopy;;   TONSILLECTOMY     TONSILLECTOMY      Current Medications: Current Meds  Medication Sig   albuterol (PROAIR HFA) 108 (90 Base) MCG/ACT inhaler Inhale 1-2 puffs into the lungs every 6 (six) hours as needed for wheezing or shortness of breath.   DULoxetine (CYMBALTA) 60 MG capsule TAKE 1 CAPSULE BY MOUTH EVERY DAY   fluticasone (FLONASE) 50 MCG/ACT nasal spray Place 2 sprays into both nostrils daily. Use for 4-6 weeks then stop and use seasonally or as needed.   hydrochlorothiazide (HYDRODIURIL) 12.5 MG tablet Take 1 tablet (12.5 mg total) by mouth daily.   lisinopril (ZESTRIL) 10 MG tablet Take 1 tablet (10 mg total) by mouth daily.   meclizine (ANTIVERT)  12.5 MG tablet Take 12.5 mg by mouth 3 (three) times daily as needed.   omeprazole (PRILOSEC) 10 MG capsule Take 1 capsule (10 mg total) by mouth daily.   ondansetron (ZOFRAN-ODT) 4 MG disintegrating tablet Take 1 tablet (4 mg total) by mouth every 8 (eight) hours as needed for nausea or vomiting.   rizatriptan (MAXALT-MLT) 10 MG disintegrating tablet Take 1 tablet (10 mg total) by mouth as needed for migraine. May repeat in 2 hours if needed     Allergies:   Versed [midazolam], Grapeseed extract [nutritional supplements], and Proanthocyanidin   Social History   Socioeconomic History   Marital status: Married    Spouse name: Not on file   Number of children: Not on file   Years of education: Not on file   Highest education level: Not on file   Occupational History   Not on file  Tobacco Use   Smoking status: Never   Smokeless tobacco: Never  Vaping Use   Vaping Use: Never used  Substance and Sexual Activity   Alcohol use: Yes    Comment: socially   Drug use: No   Sexual activity: Yes    Birth control/protection: Post-menopausal  Other Topics Concern   Not on file  Social History Narrative   Not on file   Social Determinants of Health   Financial Resource Strain: Not on file  Food Insecurity: Not on file  Transportation Needs: Not on file  Physical Activity: Not on file  Stress: Not on file  Social Connections: Not on file     Family History: The patient's family history includes AAA (abdominal aortic aneurysm) in her father; Alcohol abuse in her father and paternal grandfather; Anuerysm in her paternal grandfather; Breast cancer (age of onset: 53) in her sister; Colon cancer in her maternal uncle; Colon polyps in her mother; Goiter in her mother; Healthy in her daughter, sister, sister, sister, son, and son; Hypertension in her mother; Liver cancer in her paternal grandfather; Vision loss in her maternal grandmother. There is no history of Bladder Cancer, Kidney cancer, or Prostate cancer.  ROS:   Please see the history of present illness.     All other systems reviewed and are negative.  EKGs/Labs/Other Studies Reviewed:    The following studies were reviewed today:   EKG:  EKG not ordered today.    Recent Labs: 05/06/2022: BUN 17; Creatinine, Ser 0.93; Potassium 4.5; Sodium 137  Recent Lipid Panel    Component Value Date/Time   CHOL 268 (H) 04/27/2020 0825   TRIG 68 04/27/2020 0825   HDL 107 04/27/2020 0825   CHOLHDL 2.5 04/27/2020 0825   VLDL 36 (H) 07/08/2017 1125   LDLCALC 145 (H) 04/27/2020 0825     Risk Assessment/Calculations:          Physical Exam:    VS:  BP 122/82 (BP Location: Left Arm, Patient Position: Sitting, Cuff Size: Normal)   Pulse 88   Ht '5\' 5"'$  (1.651 m)   Wt 191 lb  (86.6 kg)   LMP 02/03/2020   SpO2 99%   BMI 31.78 kg/m     Wt Readings from Last 3 Encounters:  07/01/22 191 lb (86.6 kg)  06/04/22 194 lb (88 kg)  05/06/22 200 lb (90.7 kg)     GEN:  Well nourished, well developed in no acute distress HEENT: Normal NECK: No JVD; No carotid bruits CARDIAC: RRR, no murmurs, rubs, gallops RESPIRATORY:  Clear to auscultation without rales, wheezing or rhonchi  ABDOMEN:  Soft, non-tender, non-distended MUSCULOSKELETAL:  No edema; mild left chest tenderness with palpation SKIN: Warm and dry NEUROLOGIC:  Alert and oriented x 3 PSYCHIATRIC:  Normal affect   ASSESSMENT:    1. Precordial pain   2. Primary hypertension   3. Pure hypercholesterolemia   4. Palpitations     PLAN:    In order of problems listed above:  Chest pain, echocardiogram shows normal systolic function, EF 60 to 65%, coronary CTA showed no evidence of CAD, calcium score of 0.  Patient made aware of results, reassured.  Chest pain likely from musculoskeletal etiology. Hypertension, BP controlled.  Continue lisinopril, HCTZ. Hyperlipidemia, intolerant to statins, Repatha, prescribed.  Zetia/Nexlizet was recommended, but patient wanted to follow-up with PCP regarding cholesterol control. Palpitations, cardiac monitor showed occasional paroxysmal SVT is not associated with patient triggered events.  Overall benign cardiac monitor.  Follow-up as needed      Medication Adjustments/Labs and Tests Ordered: Current medicines are reviewed at length with the patient today.  Concerns regarding medicines are outlined above.  No orders of the defined types were placed in this encounter.  No orders of the defined types were placed in this encounter.   Patient Instructions  Medication Instructions:   Your physician recommends that you continue on your current medications as directed. Please refer to the Current Medication list given to you today.  *If you need a refill on your cardiac  medications before your next appointment, please call your pharmacy*   Lab Work: None ordered If you have labs (blood work) drawn today and your tests are completely normal, you will receive your results only by: [place] (if you have MyChart) OR A paper copy in the mail If you have any lab test that is abnormal or we need to change your treatment, we will call you to review the results.   Testing/Procedures: None ordered   Follow-Up: At [facility], you and your health needs are our priority.  As part of our continuing mission to provide you with exceptional heart care, we have created designated Provider Care Teams.  These Care Teams include your primary Cardiologist (physician) and Advanced Practice Providers (APPs -  Physician Assistants and Nurse Practitioners) who all work together to provide you with the care you need, when you need it.  We recommend signing up for the patient portal called "MyChart".  Sign up information is provided on this After Visit Summary.  MyChart is used to connect with patients for Virtual Visits (Telemedicine).  Patients are able to view lab/test results, encounter notes, upcoming appointments, etc.  Non-urgent messages can be sent to your provider as well.   To learn more about what you can do with MyChart, go to [email or web address].    Your next appointment:   Follow up as needed   The format for your next appointment:   In Person  Provider:   You may see Kate Sable, MD or one of the following Advanced Practice Providers on your designated Care Team:   Murray Hodgkins, NP Christell Faith, PA-C Cadence Kathlen Mody, [place]    Other Instructions   Important Information About Sugar         Signed, Kate Sable, MD  07/01/2022 10:11 AM    [place]

## 2022-07-01 NOTE — Patient Instructions (Signed)
Medication Instructions:  ? ?Your physician recommends that you continue on your current medications as directed. Please refer to the Current Medication list given to you today. ? ?*If you need a refill on your cardiac medications before your next appointment, please call your pharmacy* ? ? ?Lab Work: ? ?None ordered ? ?If you have labs (blood work) drawn today and your tests are completely normal, you will receive your results only by: ?MyChart Message (if you have MyChart) OR ?A paper copy in the mail ?If you have any lab test that is abnormal or we need to change your treatment, we will call you to review the results. ? ? ?Testing/Procedures: ? ?None ordered ? ? ?Follow-Up: ?At [facility], you and your health needs are our priority.  As part of our continuing mission to provide you with exceptional heart care, we have created designated Provider Care Teams.  These Care Teams include your primary Cardiologist (physician) and Advanced Practice Providers (APPs -  Physician Assistants and Nurse Practitioners) who all work together to provide you with the care you need, when you need it. ? ?We recommend signing up for the patient portal called "MyChart".  Sign up information is provided on this After Visit Summary.  MyChart is used to connect with patients for Virtual Visits (Telemedicine).  Patients are able to view lab/test results, encounter notes, upcoming appointments, etc.  Non-urgent messages can be sent to your provider as well.   ?To learn more about what you can do with MyChart, go to [email or web address].   ? ?Your next appointment:   ? ?Follow up as needed  ? ?The format for your next appointment:   ?In Person ? ?Provider:   ?You may see Brian Agbor-Etang, MD or one of the following Advanced Practice Providers on your designated Care Team:   ?Christopher Berge, NP ?Ryan Dunn, PA-C ?Cadence Furth, PA-C  ? ? ?Other Instructions ? ? ?Important Information About Sugar ? ? ? ? ? ? ?

## 2022-07-17 ENCOUNTER — Encounter: Payer: Self-pay | Admitting: Family Medicine

## 2022-07-17 ENCOUNTER — Ambulatory Visit (INDEPENDENT_AMBULATORY_CARE_PROVIDER_SITE_OTHER): Payer: BC Managed Care – PPO | Admitting: Family Medicine

## 2022-07-17 VITALS — BP 131/78 | HR 84 | Ht 65.0 in | Wt 192.8 lb

## 2022-07-17 DIAGNOSIS — F411 Generalized anxiety disorder: Secondary | ICD-10-CM

## 2022-07-17 DIAGNOSIS — E7800 Pure hypercholesterolemia, unspecified: Secondary | ICD-10-CM

## 2022-07-17 DIAGNOSIS — Z0000 Encounter for general adult medical examination without abnormal findings: Secondary | ICD-10-CM

## 2022-07-17 DIAGNOSIS — G43909 Migraine, unspecified, not intractable, without status migrainosus: Secondary | ICD-10-CM | POA: Diagnosis not present

## 2022-07-17 DIAGNOSIS — R7309 Other abnormal glucose: Secondary | ICD-10-CM

## 2022-07-17 DIAGNOSIS — E538 Deficiency of other specified B group vitamins: Secondary | ICD-10-CM

## 2022-07-17 DIAGNOSIS — I10 Essential (primary) hypertension: Secondary | ICD-10-CM

## 2022-07-17 DIAGNOSIS — M797 Fibromyalgia: Secondary | ICD-10-CM

## 2022-07-17 DIAGNOSIS — F410 Panic disorder [episodic paroxysmal anxiety] without agoraphobia: Secondary | ICD-10-CM

## 2022-07-17 DIAGNOSIS — E559 Vitamin D deficiency, unspecified: Secondary | ICD-10-CM

## 2022-07-17 NOTE — Progress Notes (Signed)
Subjective:    Patient ID: Denise Elliott, female    DOB: 1961-10-15, 61 y.o.   MRN: [id number]  Denise Elliott is a 61 y.o. female presenting on 07/17/2022 for Annual Exam   HPI  Here for Annual Physical and Lab Review  Headaches, Episodic Migraine Reports recurrent headaches over the course of 1 month. Headache can last 3-4 days at a time, temporary relief, usually only relief from resting in dark room. Taking Excedrin migraine PRN, may take a few days for headache to go away. Admits eye pain and blurred vision, impacting her vision. Admits some nausea associated no vomiting Past history on Nortriptyline   Previously has seen Dr Manuella Ghazi for inner ear imbalance 2020  Stress major factor for the migraines, also weather. Now less episodic migraines  She has come off of Nortriptyline, weaned herself off.   Duloxetine does not seem to help her arthritis pain significantly Taking Advil Ibuprofen '200mg'$ s taking 2-3 pills twice a day.  On Triptan Rizatriptan PRN some relief, less headaches now  Family history of paternal grandmother with brain aneurysm and other aneurysm, father also had renal artery aneurysm   Has upcoming Cardiology evaluation, no further palpitations. Has ECHO.  Episodic Flare - constellation of symptoms GAD with Panic Attacks - suspected Atypical chest pain with tachycardia GERD    Health Maintenance: Due for Shingles vaccine.      07/17/2022    8:21 AM 06/04/2022    9:50 AM 01/22/2022    9:21 AM  Depression screen PHQ 2/9  Decreased Interest 1 0 0  Down, Depressed, Hopeless 1 0 1  PHQ - 2 Score 2 0 1  Altered sleeping 0 0 0  Tired, decreased energy '1 1 1  '$ Change in appetite 1 0 0  Feeling bad or failure about yourself  0 0 0  Trouble concentrating 0 1 1  Moving slowly or fidgety/restless 0 0 0  Suicidal thoughts 0 0 0  PHQ-9 Score '4 2 3  '$ Difficult doing work/chores Not difficult at all Not difficult at all Not difficult at all    Past Medical  History:  Diagnosis Date   Allergy    Anxiety    Arthritis    in neck, hands   Cervical dystonia    bone spurs in neck   Complication of anesthesia    versed allergy   Depression    Fibromyalgia    muscle pain   Fibromyalgia    GERD (gastroesophageal reflux disease)    Hx of, no issues at this point   Glaucoma    closed angle glaucoma both eyes, laser treatment in past   Goiter    thyroid   Headache    sinus headaches, inner ear imbalance, light sensitivity   History of kidney stones    HLD (hyperlipidemia)    statin intolerance   Hypertension    controlled on meds   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   Plantar fasciitis    Sleep apnea    used CPAP, lost 60 pounds no issue now   Tinnitus    wears hearing aids   Varicose veins of both legs with edema    Vertigo    inner ear imbalance   Past Surgical History:  Procedure Laterality Date   CHOLECYSTECTOMY     COLONOSCOPY WITH PROPOFOL N/A 05/26/2020   Procedure: COLONOSCOPY WITH PROPOFOL;  Surgeon: Lucilla Lame, MD;  Location: [facility];  Service: Endoscopy;  Laterality: N/A;  priority 4   COSMETIC SURGERY Left    broken cheek bone   GALLBLADDER SURGERY     POLYPECTOMY  05/26/2020   Procedure: POLYPECTOMY;  Surgeon: Lucilla Lame, MD;  Location: [facility];  Service: Endoscopy;;   TONSILLECTOMY     TONSILLECTOMY     Social History   Socioeconomic History   Marital status: Married    Spouse name: Not on file   Number of children: Not on file   Years of education: Not on file   Highest education level: Not on file  Occupational History   Not on file  Tobacco Use   Smoking status: Never   Smokeless tobacco: Never  Vaping Use   Vaping Use: Never used  Substance and Sexual Activity   Alcohol use: Yes    Comment: socially   Drug use: No   Sexual activity: Yes    Birth control/protection: Post-menopausal  Other Topics Concern   Not on file  Social  History Narrative   Not on file   Social Determinants of Health   Financial Resource Strain: Not on file  Food Insecurity: Not on file  Transportation Needs: Not on file  Physical Activity: Not on file  Stress: Not on file  Social Connections: Not on file  Intimate Partner Violence: Not on file   Family History  Problem Relation Age of Onset   Colon polyps Mother    Hypertension Mother    Goiter Mother    Anuerysm Father    AAA (abdominal aortic aneurysm) Father    Alcohol abuse Father    Healthy Sister    Breast cancer Sister 32   Healthy Sister    Healthy Sister    Vision loss Maternal Grandmother    Liver cancer Paternal Grandfather    Alcohol abuse Paternal Grandfather    Anuerysm Paternal [employer] officer    Healthy Daughter    Healthy Son    Healthy Son    Colon cancer Maternal Uncle    Bladder Cancer Neg Hx    Kidney cancer Neg Hx    Prostate cancer Neg Hx    Current Outpatient Medications on File Prior to Visit  Medication Sig   albuterol (PROAIR HFA) 108 (90 Base) MCG/ACT inhaler Inhale 1-2 puffs into the lungs every 6 (six) hours as needed for wheezing or shortness of breath.   DULoxetine (CYMBALTA) 60 MG capsule TAKE 1 CAPSULE BY MOUTH EVERY DAY   fluticasone (FLONASE) 50 MCG/ACT nasal spray Place 2 sprays into both nostrils daily. Use for 4-6 weeks then stop and use seasonally or as needed.   hydrochlorothiazide (HYDRODIURIL) 12.5 MG tablet Take 1 tablet (12.5 mg total) by mouth daily.   lisinopril (ZESTRIL) 10 MG tablet Take 1 tablet (10 mg total) by mouth daily.   meclizine (ANTIVERT) 12.5 MG tablet Take 12.5 mg by mouth 3 (three) times daily as needed.   omeprazole (PRILOSEC) 10 MG capsule Take 1 capsule (10 mg total) by mouth daily.   ondansetron (ZOFRAN-ODT) 4 MG disintegrating tablet Take 1 tablet (4 mg total) by mouth every 8 (eight) hours as needed for nausea or vomiting.   rizatriptan (MAXALT-MLT) 10 MG disintegrating tablet Take 1 tablet (10 mg total)  by mouth as needed for migraine. May repeat in 2 hours if needed   No current facility-administered medications on file prior to visit.    Review of Systems  Constitutional:  Negative for activity change, appetite change, chills, diaphoresis, fatigue and fever.  HENT:  Negative for congestion and hearing loss.   Eyes:  Negative for visual disturbance.  Respiratory:  Negative for cough, chest tightness, shortness of breath and wheezing.   Cardiovascular:  Negative for chest pain, palpitations and leg swelling.  Gastrointestinal:  Negative for abdominal pain, constipation, diarrhea, nausea and vomiting.  Genitourinary:  Negative for dysuria, frequency and hematuria.  Musculoskeletal:  Negative for arthralgias and neck pain.  Skin:  Negative for rash.  Neurological:  Negative for dizziness, weakness, light-headedness, numbness and headaches.  Hematological:  Negative for adenopathy.  Psychiatric/Behavioral:  Negative for behavioral problems, dysphoric mood and sleep disturbance.    Per HPI unless specifically indicated above     Objective:    BP 131/78   Pulse 84   Ht '5\' 5"'$  (1.651 m)   Wt 192 lb 12.8 oz (87.5 kg)   LMP 02/03/2020   SpO2 100%   BMI 32.08 kg/m   Wt Readings from Last 3 Encounters:  07/17/22 192 lb 12.8 oz (87.5 kg)  07/01/22 191 lb (86.6 kg)  06/04/22 194 lb (88 kg)    Physical Exam Vitals and nursing note reviewed.  Constitutional:      General: She is not in acute distress.    Appearance: She is well-developed. She is not diaphoretic.     Comments: Well-appearing, comfortable, cooperative  HENT:     Head: Normocephalic and atraumatic.  Eyes:     General:        Right eye: No discharge.        Left eye: No discharge.     Conjunctiva/sclera: Conjunctivae normal.     Pupils: Pupils are equal, round, and reactive to light.  Neck:     Thyroid: No thyromegaly.  Cardiovascular:     Rate and Rhythm: Normal rate and regular rhythm.     Pulses: Normal  pulses.     Heart sounds: Normal heart sounds. No murmur heard. Pulmonary:     Effort: Pulmonary effort is normal. No respiratory distress.     Breath sounds: Normal breath sounds. No wheezing or rales.  Abdominal:     General: Bowel sounds are normal. There is no distension.     Palpations: Abdomen is soft. There is no mass.     Tenderness: There is no abdominal tenderness.  Musculoskeletal:        General: No tenderness. Normal range of motion.     Cervical back: Normal range of motion and neck supple.     Comments: Upper / Lower Extremities: - Normal muscle tone, strength bilateral upper extremities 5/5, lower extremities 5/5  Lymphadenopathy:     Cervical: No cervical adenopathy.  Skin:    General: Skin is warm and dry.     Findings: No erythema or rash.  Neurological:     Mental Status: She is alert and oriented to person, place, and time.     Comments: Distal sensation intact to light touch all extremities  Psychiatric:        Mood and Affect: Mood normal.        Behavior: Behavior normal.        Thought Content: Thought content normal.     Comments: Well groomed, good eye contact, normal speech and thoughts    Results for orders placed or performed in visit on 06/13/22  ECHOCARDIOGRAM COMPLETE  Result Value Ref Range   AR max vel 1.96 cm2   AV Peak grad 4.5 mmHg   Ao pk vel 1.06 m/s   S' Lateral 2.50  cm   Area-P 1/2 5.34 cm2   AV Area VTI 2.22 cm2   AV Mean grad 2.0 mmHg   AV Area mean vel 2.21 cm2      Assessment & Plan:   Problem List Items Addressed This Visit     Fibromyalgia   HLD (hyperlipidemia)   Relevant Orders   Lipid panel   TSH   Hypertension   Relevant Orders   COMPLETE METABOLIC PANEL WITH GFR   CBC with Differential/Platelet   Other Visit Diagnoses     Annual physical exam    -  Primary   Relevant Orders   COMPLETE METABOLIC PANEL WITH GFR   CBC with Differential/Platelet   Lipid panel   Hemoglobin A1c   Episodic migraine        Generalized anxiety disorder with panic attacks       Vitamin D deficiency       Relevant Orders   VITAMIN D 25 Hydroxy (Vit-D Deficiency, Fractures)   Vitamin B12 deficiency       Relevant Orders   Vitamin B12   Abnormal glucose       Relevant Orders   Hemoglobin A1c       Updated Health Maintenance information Fasting labs ordered today, pending results. Encouraged improvement to lifestyle with diet and exercise Goal of weight loss  Future Pap Smear at GYN when ready. Last was 2018. Due now after 5 years.  Future Shingles vaccin Shingrix if interested.  Return to Neuro for further headache evaluation.  Continue current therapy for episodic migraine headaches 4-14 HA days per month Use triptan PRN  Continue Duloxetine for fibromyalgia  HTN controlled on therapy.  [facility] - Neurology Dept [place], [place] [address] Phone: [phone]  Labs today stay tuned for results.  IF Low Vitamin D < 30 Start OTC Vitamin D3 5,000 iu daily for 12 weeks then reduce to OTC Vitamin D3 2,000 iu daily for maintenance  IF B12 is normal, you can supplement with daily B12 vitamin or multi vitamin, usually around 1000 mcg per day.  IF B12 is significantly low, can do B12 injections once weekly for 4 weeks, then monthly for 4 months.  Orders Placed This Encounter  Procedures   COMPLETE METABOLIC PANEL WITH GFR   CBC with Differential/Platelet   Lipid panel    Order Specific Question:   Has the patient fasted?    Answer:   Yes   Hemoglobin A1c   TSH   VITAMIN D 25 Hydroxy (Vit-D Deficiency, Fractures)   Vitamin B12     No orders of the defined types were placed in this encounter.     Follow up plan: Return in about 6 months (around 01/17/2023) for 6 month follow-up migraines, updates.  [name], DO [facility] 07/17/2022, 8:39 AM

## 2022-07-17 NOTE — Patient Instructions (Addendum)
Thank you for coming to the office today.  Future Pap Smear at GYN when ready. Last was 2018. Due now after 5 years.  Future Shingles vaccin Shingrix if interested.  [facility] - Neurology Dept [place], [place] [address] Phone: [phone]  Labs today stay tuned for results.  IF Low Vitamin D < 30 Start OTC Vitamin D3 5,000 iu daily for 12 weeks then reduce to OTC Vitamin D3 2,000 iu daily for maintenance  IF B12 is normal, you can supplement with daily B12 vitamin or multi vitamin, usually around 1000 mcg per day.  IF B12 is significantly low, can do B12 injections once weekly for 4 weeks, then monthly for 4 months.   Please schedule a Follow-up Appointment to: Return in about 6 months (around 01/17/2023) for 6 month follow-up migraines, updates.  If you have any other questions or concerns, please feel free to call the office or send a message through [facility]. You may also schedule an earlier appointment if necessary.  Additionally, you may be receiving a survey about your experience at our office within a few days to 1 week by e-mail or mail. We value your feedback.  [name], DO [facility]

## 2022-07-18 LAB — COMPLETE METABOLIC PANEL WITHOUT GFR
AG Ratio: 1.6 (calc) (ref 1.0–2.5)
ALT: 37 U/L — ABNORMAL HIGH (ref 6–29)
AST: 23 U/L (ref 10–35)
Albumin: 4.5 g/dL (ref 3.6–5.1)
Alkaline phosphatase (APISO): 138 U/L (ref 37–153)
BUN: 20 mg/dL (ref 7–25)
CO2: 29 mmol/L (ref 20–32)
Calcium: 9.9 mg/dL (ref 8.6–10.4)
Chloride: 98 mmol/L (ref 98–110)
Creat: 1.03 mg/dL (ref 0.50–1.05)
Globulin: 2.9 g/dL (ref 1.9–3.7)
Glucose, Bld: 102 mg/dL — ABNORMAL HIGH (ref 65–99)
Potassium: 4.9 mmol/L (ref 3.5–5.3)
Sodium: 137 mmol/L (ref 135–146)
Total Bilirubin: 0.3 mg/dL (ref 0.2–1.2)
Total Protein: 7.4 g/dL (ref 6.1–8.1)
eGFR: 61 mL/min/{1.73_m2}

## 2022-07-18 LAB — LIPID PANEL
Cholesterol: 342 mg/dL — ABNORMAL HIGH
HDL: 61 mg/dL
Non-HDL Cholesterol (Calc): 281 mg/dL — ABNORMAL HIGH
Total CHOL/HDL Ratio: 5.6 (calc) — ABNORMAL HIGH
Triglycerides: 423 mg/dL — ABNORMAL HIGH

## 2022-07-18 LAB — CBC WITH DIFFERENTIAL/PLATELET
Absolute Monocytes: 467 {cells}/uL (ref 200–950)
Basophils Absolute: 73 {cells}/uL (ref 0–200)
Basophils Relative: 1 %
Eosinophils Absolute: 277 {cells}/uL (ref 15–500)
Eosinophils Relative: 3.8 %
HCT: 45.3 % — ABNORMAL HIGH (ref 35.0–45.0)
Hemoglobin: 14.7 g/dL (ref 11.7–15.5)
Lymphs Abs: 1832 {cells}/uL (ref 850–3900)
MCH: 28.8 pg (ref 27.0–33.0)
MCHC: 32.5 g/dL (ref 32.0–36.0)
MCV: 88.6 fL (ref 80.0–100.0)
MPV: 9.1 fL (ref 7.5–12.5)
Monocytes Relative: 6.4 %
Neutro Abs: 4650 {cells}/uL (ref 1500–7800)
Neutrophils Relative %: 63.7 %
Platelets: 291 10*3/uL (ref 140–400)
RBC: 5.11 Million/uL — ABNORMAL HIGH (ref 3.80–5.10)
RDW: 12.8 % (ref 11.0–15.0)
Total Lymphocyte: 25.1 %
WBC: 7.3 10*3/uL (ref 3.8–10.8)

## 2022-07-18 LAB — VITAMIN D 25 HYDROXY (VIT D DEFICIENCY, FRACTURES): Vit D, 25-Hydroxy: 27 ng/mL — ABNORMAL LOW (ref 30–100)

## 2022-07-18 LAB — VITAMIN B12: Vitamin B-12: 564 pg/mL (ref 200–1100)

## 2022-07-18 LAB — HEMOGLOBIN A1C
Hgb A1c MFr Bld: 5.8 %{Hb} — ABNORMAL HIGH
Mean Plasma Glucose: 120 mg/dL
eAG (mmol/L): 6.6 mmol/L

## 2022-07-18 LAB — TSH: TSH: 2.65 mIU/L (ref 0.40–4.50)

## 2022-08-07 ENCOUNTER — Other Ambulatory Visit: Payer: Self-pay | Admitting: Family Medicine

## 2022-08-07 DIAGNOSIS — Z1231 Encounter for screening mammogram for malignant neoplasm of breast: Secondary | ICD-10-CM

## 2022-08-27 ENCOUNTER — Ambulatory Visit
Admission: RE | Admit: 2022-08-27 | Discharge: 2022-08-27 | Disposition: A | Discharge status: Home / Self Care | Payer: BC Managed Care – PPO | Source: Ambulatory Visit | Attending: Family Medicine | Admitting: Family Medicine

## 2022-08-27 DIAGNOSIS — Z1231 Encounter for screening mammogram for malignant neoplasm of breast: Secondary | ICD-10-CM | POA: Diagnosis present

## 2022-08-31 ENCOUNTER — Other Ambulatory Visit: Payer: Self-pay | Admitting: Family Medicine

## 2022-08-31 DIAGNOSIS — I10 Essential (primary) hypertension: Secondary | ICD-10-CM

## 2022-09-02 ENCOUNTER — Encounter: Payer: Self-pay | Admitting: Family Medicine

## 2022-09-02 NOTE — Telephone Encounter (Signed)
Requested Prescriptions  Pending Prescriptions Disp Refills  . hydrochlorothiazide (HYDRODIURIL) 12.5 MG tablet [Pharmacy Med Name: HYDROCHLOROTHIAZIDE 12.5 MG TB] 90 tablet 3    Sig: TAKE 1 TABLET BY MOUTH EVERY DAY     Cardiovascular: Diuretics - Thiazide Passed - 08/31/2022 10:58 AM      Passed - Cr in normal range and within 180 days    Creat  Date Value Ref Range Status  07/17/2022 1.03 0.50 - 1.05 mg/dL Final         Passed - K in normal range and within 180 days    Potassium  Date Value Ref Range Status  07/17/2022 4.9 3.5 - 5.3 mmol/L Final         Passed - Na in normal range and within 180 days    Sodium  Date Value Ref Range Status  07/17/2022 137 135 - 146 mmol/L Final  07/16/2019 140 134 - 144 mmol/L Final         Passed - Last BP in normal range    BP Readings from Last 1 Encounters:  07/17/22 131/78         Passed - Valid encounter within last 6 months    Recent Outpatient Visits          1 month ago Annual physical exam   [place], DO   3 months ago Episodic migraine   [place], DO   5 months ago Generalized anxiety disorder with panic attacks   [place], DO   7 months ago Subacute frontal sinusitis   [place], DO   12 months ago Chronic pain of right wrist   [facility] [place], Devonne Doughty, DO             . lisinopril (ZESTRIL) 10 MG tablet [Pharmacy Med Name: LISINOPRIL 10 MG TABLET] 90 tablet 3    Sig: TAKE 1 TABLET BY MOUTH EVERY DAY     Cardiovascular:  ACE Inhibitors Passed - 08/31/2022 10:58 AM      Passed - Cr in normal range and within 180 days    Creat  Date Value Ref Range Status  07/17/2022 1.03 0.50 - 1.05 mg/dL Final         Passed - K in normal range and within 180 days    Potassium  Date Value Ref Range Status  07/17/2022 4.9 3.5 -  5.3 mmol/L Final         Passed - Patient is not pregnant      Passed - Last BP in normal range    BP Readings from Last 1 Encounters:  07/17/22 131/78         Passed - Valid encounter within last 6 months    Recent Outpatient Visits          1 month ago Annual physical exam   [place], DO   3 months ago Episodic migraine   [place], DO   5 months ago Generalized anxiety disorder with panic attacks   [place], DO   7 months ago Subacute frontal sinusitis   [place], DO   12 months ago Chronic pain of right wrist   [place], Devonne Doughty, DO

## 2022-09-03 MED ORDER — NORTRIPTYLINE HCL 50 MG PO CAPS: 100.0000 mg | ORAL_CAPSULE | Freq: Every day | ORAL | 0 refills | Status: DC

## 2022-09-03 NOTE — Addendum Note (Signed)
Addended by: Jearld Fenton on: 09/03/2022 09:14 AM   Modules accepted: Orders

## 2022-09-17 ENCOUNTER — Other Ambulatory Visit: Payer: Self-pay | Admitting: Family Medicine

## 2022-09-17 DIAGNOSIS — M797 Fibromyalgia: Secondary | ICD-10-CM

## 2022-09-17 NOTE — Telephone Encounter (Signed)
Requested Prescriptions  Pending Prescriptions Disp Refills  . DULoxetine (CYMBALTA) 60 MG capsule [Pharmacy Med Name: DULOXETINE HCL DR 60 MG CAP] 90 capsule 1    Sig: TAKE 1 CAPSULE BY MOUTH EVERY DAY     Psychiatry: Antidepressants - SNRI - duloxetine Passed - 09/17/2022  2:23 AM      Passed - Cr in normal range and within 360 days    Creat  Date Value Ref Range Status  07/17/2022 1.03 0.50 - 1.05 mg/dL Final         Passed - eGFR is 30 or above and within 360 days    GFR, Est African American  Date Value Ref Range Status  12/22/2020 85 > OR = 60 mL/min/1.33m Final   GFR, Est Non African American  Date Value Ref Range Status  12/22/2020 73 > OR = 60 mL/min/1.77mFinal   GFR, Estimated  Date Value Ref Range Status  05/06/2022 >60 >60 mL/min Final    Comment:    (NOTE) Calculated using the CKD-EPI Creatinine Equation (2021)    eGFR  Date Value Ref Range Status  07/17/2022 61 > OR = 60 mL/min/1.7374minal         Passed - Completed PHQ-2 or PHQ-9 in the last 360 days      Passed - Last BP in normal range    BP Readings from Last 1 Encounters:  07/17/22 131/78         Passed - Valid encounter within last 6 months    Recent Outpatient Visits          2 months [place] physical [place]   3 months ago Episodic migraine   [place]   5 months ago Generalized anxiety disorder with panic attacks   [place]   7 months ago Subacute frontal sinusitis   [place]   1 year ago Chronic pain of right wrist   [place]

## 2022-09-26 ENCOUNTER — Other Ambulatory Visit: Payer: Self-pay | Admitting: Internal Medicine

## 2022-09-26 NOTE — Telephone Encounter (Signed)
Requested Prescriptions  Pending Prescriptions Disp Refills  . nortriptyline (PAMELOR) 50 MG capsule [Pharmacy Med Name: NORTRIPTYLINE HCL 50 MG CAP] 180 capsule 0    Sig: TAKE 2 CAPSULES (100 MG TOTAL) BY MOUTH AT BEDTIME.     Psychiatry:  Antidepressants - Heterocyclics (TCAs) Passed - 09/26/2022  8:34 AM      Passed - Completed PHQ-2 or PHQ-9 in the last 360 days      Passed - Valid encounter within last 6 months    Recent Outpatient Visits          2 months ago Annual physical exam   [place], DO   3 months ago Episodic migraine   [place], DO   6 months ago Generalized anxiety disorder with panic attacks   [place], DO   8 months ago Subacute frontal sinusitis   [place], DO   1 year ago Chronic pain of right wrist   [place], Devonne Doughty, DO

## 2022-12-21 ENCOUNTER — Other Ambulatory Visit: Payer: Self-pay | Admitting: Internal Medicine

## 2022-12-21 ENCOUNTER — Other Ambulatory Visit: Payer: Self-pay | Admitting: Family Medicine

## 2022-12-21 DIAGNOSIS — M797 Fibromyalgia: Secondary | ICD-10-CM

## 2022-12-23 NOTE — Telephone Encounter (Signed)
Unable to refill per protocol, Rx request was discontinued 03/25/22, patient not taking. Medication was reordered 12/23/22 for 90 days. Will refuse.  Requested Prescriptions  Pending Prescriptions Disp Refills   nortriptyline (PAMELOR) 25 MG capsule [Pharmacy Med Name: NORTRIPTYLINE HCL 25 MG CAP] 180 capsule 1    Sig: TAKE 2 CAPSULES (50 MG TOTAL) BY MOUTH EVERY DAY AT BEDTIME     Psychiatry:  Antidepressants - Heterocyclics (TCAs) Passed - 12/21/2022 10:19 AM      Passed - Completed PHQ-2 or PHQ-9 in the last 360 days      Passed - Valid encounter within last 6 months    Recent Outpatient Visits           5 months ago Annual physical exam   [place], DO   6 months ago Episodic migraine   [place], DO   9 months ago Generalized anxiety disorder with panic attacks   [place], DO   11 months ago Subacute frontal sinusitis   [place], DO   1 year ago Chronic pain of right wrist   [place], Devonne Doughty, DO

## 2022-12-23 NOTE — Telephone Encounter (Signed)
Requested Prescriptions  Pending Prescriptions Disp Refills   nortriptyline (PAMELOR) 50 MG capsule [Pharmacy Med Name: NORTRIPTYLINE HCL 50 MG CAP] 180 capsule 0    Sig: TAKE 2 CAPSULES (100 MG TOTAL) BY MOUTH AT BEDTIME.     Psychiatry:  Antidepressants - Heterocyclics (TCAs) Passed - 12/21/2022  8:34 AM      Passed - Completed PHQ-2 or PHQ-9 in the last 360 days      Passed - Valid encounter within last 6 months    Recent Outpatient Visits           5 months ago Annual physical exam   [place], DO   6 months ago Episodic migraine   [place], DO   9 months ago Generalized anxiety disorder with panic attacks   [place], DO   11 months ago Subacute frontal sinusitis   [place], DO   1 year ago Chronic pain of right wrist   [place], Devonne Doughty, DO

## 2023-03-03 ENCOUNTER — Encounter: Payer: Self-pay | Admitting: Family Medicine

## 2023-03-03 DIAGNOSIS — Z124 Encounter for screening for malignant neoplasm of cervix: Secondary | ICD-10-CM

## 2023-03-12 ENCOUNTER — Ambulatory Visit: Payer: BC Managed Care – PPO | Admitting: Family Medicine

## 2023-03-12 ENCOUNTER — Encounter: Payer: Self-pay | Admitting: Family Medicine

## 2023-03-12 VITALS — BP 136/84 | HR 98 | Ht 65.0 in | Wt 200.8 lb

## 2023-03-12 DIAGNOSIS — J9801 Acute bronchospasm: Secondary | ICD-10-CM

## 2023-03-12 DIAGNOSIS — J0110 Acute frontal sinusitis, unspecified: Secondary | ICD-10-CM

## 2023-03-12 DIAGNOSIS — H6992 Unspecified Eustachian tube disorder, left ear: Secondary | ICD-10-CM | POA: Diagnosis not present

## 2023-03-12 MED ORDER — HYDROCOD POLI-CHLORPHE POLI ER 10-8 MG/5ML PO SUER: 5.0000 mL | Freq: Two times a day (BID) | ORAL | 0 refills | Status: DC | PRN

## 2023-03-12 MED ORDER — PREDNISONE 10 MG PO TABS: ORAL_TABLET | ORAL | 0 refills | Status: DC

## 2023-03-12 MED ORDER — ALBUTEROL SULFATE HFA 108 (90 BASE) MCG/ACT IN AERS: 1.0000 | INHALATION_SPRAY | Freq: Four times a day (QID) | RESPIRATORY_TRACT | 1 refills | Status: DC | PRN

## 2023-03-12 MED ORDER — AMOXICILLIN-POT CLAVULANATE 875-125 MG PO TABS: 1.0000 | ORAL_TABLET | Freq: Two times a day (BID) | ORAL | 0 refills | Status: DC

## 2023-03-12 MED ORDER — FLUTICASONE PROPIONATE 50 MCG/ACT NA SUSP: 2.0000 | Freq: Every day | NASAL | 3 refills | Status: DC

## 2023-03-12 NOTE — Patient Instructions (Addendum)
   Please schedule a Follow-up Appointment to: Return if symptoms worsen or fail to improve.  If you have any other questions or concerns, please feel free to call the office or send a message through MyChart. You may also schedule an earlier appointment if necessary.  Additionally, you may be receiving a survey about your experience at our office within a few days to 1 week by e-mail or mail. We value your feedback.  Masato Pettie, DO [facility], [facility] 

## 2023-03-12 NOTE — Progress Notes (Signed)
Subjective:    Patient ID: Denise Elliott, female    DOB: 1960-12-19, 62 y.o.   MRN: [id number]  Denise Elliott is a 62 y.o. female presenting on 03/12/2023 for Sinusitis   HPI  Sinusitis Reports symptoms started initially early Feb 2024 and she was treated at an urgent care with Amoxicillin, she felt overall 80% improved then finished treatment then symptoms did return. Symptoms now really worsening over past 2 weeks. - She is taking OTC allergy and cold cough medication - She admits not sleeping well due to cough waking her up. Sleeps on 3 pillows to prop her up - She admits pressure and pain in sinuses LEFT sided mostly and also Left ear pain - She took home COVID test negative on Sunday 3 days ago. Admits night time feels mild fever Denies nausea vomiting diarrhea abdominal pain      07/17/2022    8:21 AM 06/04/2022    9:50 AM 01/22/2022    9:21 AM  Depression screen PHQ 2/9  Decreased Interest 1 0 0  Down, Depressed, Hopeless 1 0 1  PHQ - 2 Score 2 0 1  Altered sleeping 0 0 0  Tired, decreased energy 1 1 1   Change in appetite 1 0 0  Feeling bad or failure about yourself  0 0 0  Trouble concentrating 0 1 1  Moving slowly or fidgety/restless 0 0 0  Suicidal thoughts 0 0 0  PHQ-9 Score 4 2 3   Difficult doing work/chores Not difficult at all Not difficult at all Not difficult at all    Social History   Tobacco Use   Smoking status: Never   Smokeless tobacco: Never  Vaping Use   Vaping Use: Never used  Substance Use Topics   Alcohol use: Yes    Comment: socially   Drug use: No    Review of Systems Per HPI unless specifically indicated above     Objective:    BP 136/84   Pulse 98   Ht 5\' 5"  (1.651 m)   Wt 200 lb 12.8 oz (91.1 kg)   LMP 02/03/2020   SpO2 97%   BMI 33.41 kg/m   Wt Readings from Last 3 Encounters:  03/12/23 200 lb 12.8 oz (91.1 kg)  07/17/22 192 lb 12.8 oz (87.5 kg)  07/01/22 191 lb (86.6 kg)    Physical Exam Vitals and nursing note  reviewed.  Constitutional:      General: She is not in acute distress.    Appearance: She is well-developed. She is not diaphoretic.     Comments: Well-appearing, comfortable, cooperative  HENT:     Head: Normocephalic and atraumatic.     Ears:     Comments: Bilateral TM effusion R>L with some mild erythema on L Eyes:     General:        Right eye: No discharge.        Left eye: No discharge.     Conjunctiva/sclera: Conjunctivae normal.  Neck:     Thyroid: No thyromegaly.  Cardiovascular:     Rate and Rhythm: Normal rate and regular rhythm.     Heart sounds: Normal heart sounds. No murmur heard. Pulmonary:     Effort: Pulmonary effort is normal. No respiratory distress.     Breath sounds: Wheezing present. No rales.     Comments: coughing Musculoskeletal:        General: Normal range of motion.     Cervical back: Normal range of motion and neck  supple.  Lymphadenopathy:     Cervical: No cervical adenopathy.  Skin:    General: Skin is warm and dry.     Findings: No erythema or rash.  Neurological:     Mental Status: She is alert and oriented to person, place, and time.  Psychiatric:        Behavior: Behavior normal.     Comments: Well groomed, good eye contact, normal speech and thoughts    Results for orders placed or performed in visit on 07/17/22  COMPLETE METABOLIC PANEL WITH GFR  Result Value Ref Range   Glucose, Bld 102 (H) 65 - 99 mg/dL   BUN 20 7 - 25 mg/dL   Creat 1.03 0.50 - 1.05 mg/dL   eGFR 61 > OR = 60 mL/min/1.43m2   BUN/Creatinine Ratio SEE NOTE: 6 - 22 (calc)   Sodium 137 135 - 146 mmol/L   Potassium 4.9 3.5 - 5.3 mmol/L   Chloride 98 98 - 110 mmol/L   CO2 29 20 - 32 mmol/L   Calcium 9.9 8.6 - 10.4 mg/dL   Total Protein 7.4 6.1 - 8.1 g/dL   Albumin 4.5 3.6 - 5.1 g/dL   Globulin 2.9 1.9 - 3.7 g/dL (calc)   AG Ratio 1.6 1.0 - 2.5 (calc)   Total Bilirubin 0.3 0.2 - 1.2 mg/dL   Alkaline phosphatase (APISO) 138 37 - 153 U/L   AST 23 10 - 35 U/L    ALT 37 (H) 6 - 29 U/L  CBC with Differential/Platelet  Result Value Ref Range   WBC 7.3 3.8 - 10.8 Thousand/uL   RBC 5.11 (H) 3.80 - 5.10 Million/uL   Hemoglobin 14.7 11.7 - 15.5 g/dL   HCT 45.3 (H) 35.0 - 45.0 %   MCV 88.6 80.0 - 100.0 fL   MCH 28.8 27.0 - 33.0 pg   MCHC 32.5 32.0 - 36.0 g/dL   RDW 12.8 11.0 - 15.0 %   Platelets 291 140 - 400 Thousand/uL   MPV 9.1 7.5 - 12.5 fL   Neutro Abs 4,650 1,500 - 7,800 cells/uL   Lymphs Abs 1,832 850 - 3,900 cells/uL   Absolute Monocytes 467 200 - 950 cells/uL   Eosinophils Absolute 277 15 - 500 cells/uL   Basophils Absolute 73 0 - 200 cells/uL   Neutrophils Relative % 63.7 %   Total Lymphocyte 25.1 %   Monocytes Relative 6.4 %   Eosinophils Relative 3.8 %   Basophils Relative 1.0 %  Lipid panel  Result Value Ref Range   Cholesterol 342 (H) <200 mg/dL   HDL 61 > OR = 50 mg/dL   Triglycerides 423 (H) <150 mg/dL   LDL Cholesterol (Calc)  mg/dL (calc)   Total CHOL/HDL Ratio 5.6 (H) <5.0 (calc)   Non-HDL Cholesterol (Calc) 281 (H) <130 mg/dL (calc)  Hemoglobin A1c  Result Value Ref Range   Hgb A1c MFr Bld 5.8 (H) <5.7 % of total Hgb   Mean Plasma Glucose 120 mg/dL   eAG (mmol/L) 6.6 mmol/L  TSH  Result Value Ref Range   TSH 2.65 0.40 - 4.50 mIU/L  VITAMIN D 25 Hydroxy (Vit-D Deficiency, Fractures)  Result Value Ref Range   Vit D, 25-Hydroxy 27 (L) 30 - 100 ng/mL  Vitamin B12  Result Value Ref Range   Vitamin B-12 564 200 - 1,100 pg/mL      Assessment & Plan:   Problem List Items Addressed This Visit     Acute non-recurrent sinusitis - Primary   Relevant Medications  amoxicillin-clavulanate (AUGMENTIN) 875-125 MG tablet   predniSONE (DELTASONE) 10 MG tablet   chlorpheniramine-HYDROcodone (TUSSIONEX) 10-8 MG/5ML   fluticasone (FLONASE) 50 MCG/ACT nasal spray   Other Visit Diagnoses     Subacute frontal sinusitis       Relevant Medications   amoxicillin-clavulanate (AUGMENTIN) 875-125 MG tablet   predniSONE  (DELTASONE) 10 MG tablet   chlorpheniramine-HYDROcodone (TUSSIONEX) 10-8 MG/5ML   fluticasone (FLONASE) 50 MCG/ACT nasal spray   Eustachian tube dysfunction, left       Relevant Medications   fluticasone (FLONASE) 50 MCG/ACT nasal spray   Acute bronchospasm       Relevant Medications   albuterol (PROAIR HFA) 108 (90 Base) MCG/ACT inhaler   chlorpheniramine-HYDROcodone (TUSSIONEX) 10-8 MG/5ML       Consistent with acute frontal sinusitis, likely initially viral URI vs allergic rhinitis component with worsening concern for bacterial infection given recurrent flare and now 2nd sickening duration weeks  Exam with bronchospasm, no prior asthma history  Plan: Start Augmentin 875-125mg  PO BID x 10 days Prednisone taper Start nasal steroid Flonase 2 sprays in each nostril daily for 4-6 weeks, may repeat course seasonally or as needed Okay to take anti histamine Rx Cough syrup tussionex Rx Albuterol AS NEEDED if need Return criteria reviewed   Meds ordered this encounter  Medications   amoxicillin-clavulanate (AUGMENTIN) 875-125 MG tablet    Sig: Take 1 tablet by mouth 2 (two) times daily.    Dispense:  20 tablet    Refill:  0   predniSONE (DELTASONE) 10 MG tablet    Sig: Take 6 tabs with breakfast Day 1, 5 tabs Day 2, 4 tabs Day 3, 3 tabs Day 4, 2 tabs Day 5, 1 tab Day 6.    Dispense:  21 tablet    Refill:  0   albuterol (PROAIR HFA) 108 (90 Base) MCG/ACT inhaler    Sig: Inhale 1-2 puffs into the lungs every 6 (six) hours as needed for wheezing or shortness of breath.    Dispense:  8 g    Refill:  1   chlorpheniramine-HYDROcodone (TUSSIONEX) 10-8 MG/5ML    Sig: Take 5 mLs by mouth every 12 (twelve) hours as needed for cough.    Dispense:  115 mL    Refill:  0   fluticasone (FLONASE) 50 MCG/ACT nasal spray    Sig: Place 2 sprays into both nostrils daily. Use for 4-6 weeks then stop and use seasonally or as needed.    Dispense:  16 g    Refill:  3      Follow up  plan: Return if symptoms worsen or fail to improve.   [name], [facility] 03/12/2023, 11:53 AM

## 2023-03-17 ENCOUNTER — Other Ambulatory Visit: Payer: Self-pay | Admitting: Family Medicine

## 2023-03-17 DIAGNOSIS — M797 Fibromyalgia: Secondary | ICD-10-CM

## 2023-03-18 NOTE — Telephone Encounter (Signed)
Requested Prescriptions  Pending Prescriptions Disp Refills   DULoxetine (CYMBALTA) 60 MG capsule [Pharmacy Med Name: DULOXETINE HCL DR 60 MG CAP] 90 capsule 1    Sig: TAKE 1 CAPSULE BY MOUTH EVERY DAY     Psychiatry: Antidepressants - SNRI - duloxetine Passed - 03/17/2023  6:59 PM      Passed - Cr in normal range and within 360 days    Creat  Date Value Ref Range Status  07/17/2022 1.03 0.50 - 1.05 mg/dL Final         Passed - eGFR is 30 or above and within 360 days    GFR, Est African American  Date Value Ref Range Status  12/22/2020 85 > OR = 60 mL/min/1.24m2 Final   GFR, Est Non African American  Date Value Ref Range Status  12/22/2020 73 > OR = 60 mL/min/1.4m2 Final   GFR, Estimated  Date Value Ref Range Status  05/06/2022 >60 >60 mL/min Final    Comment:    (NOTE) Calculated using the CKD-EPI Creatinine Equation (2021)    eGFR  Date Value Ref Range Status  07/17/2022 61 > OR = 60 mL/min/1.30m2 Final         Passed - Completed PHQ-2 or PHQ-9 in the last 360 days      Passed - Last BP in normal range    BP Readings from Last 1 Encounters:  03/12/23 136/84         Passed - Valid encounter within last 6 months    Recent Outpatient Visits           6 days ago Acute non-recurrent frontal sinusitis   [place], DO   8 months ago Annual physical exam   [facility] Olin Hauser, DO   9 months ago Episodic migraine   [place], DO   11 months ago Generalized anxiety disorder with panic attacks   [place], DO   1 year ago Subacute frontal sinusitis   [place], [place]

## 2023-03-20 ENCOUNTER — Other Ambulatory Visit: Payer: Self-pay | Admitting: Family Medicine

## 2023-03-20 NOTE — Telephone Encounter (Signed)
Requested Prescriptions  Pending Prescriptions Disp Refills   nortriptyline (PAMELOR) 50 MG capsule [Pharmacy Med Name: NORTRIPTYLINE HCL 50 MG CAP] 180 capsule 0    Sig: TAKE 2 CAPSULES (100 MG TOTAL) BY MOUTH AT BEDTIME.     Psychiatry:  Antidepressants - Heterocyclics (TCAs) Passed - 03/20/2023  2:26 AM      Passed - Completed PHQ-2 or PHQ-9 in the last 360 days      Passed - Valid encounter within last 6 months    Recent Outpatient Visits           1 week ago Acute non-recurrent frontal sinusitis   [place], DO   8 months ago Annual physical exam   [facility] Olin Hauser, DO   9 months ago Episodic migraine   [place], DO   12 months ago Generalized anxiety disorder with panic attacks   [place], DO   1 year ago Subacute frontal sinusitis   [facility] [place], Devonne Doughty, [place]

## 2023-04-01 LAB — RESULTS CONSOLE HPV: CHL HPV: NEGATIVE

## 2023-04-01 LAB — HM PAP SMEAR: HM Pap smear: NORMAL

## 2023-04-03 ENCOUNTER — Encounter: Payer: Self-pay | Admitting: Family Medicine

## 2023-04-03 DIAGNOSIS — M797 Fibromyalgia: Secondary | ICD-10-CM

## 2023-05-08 ENCOUNTER — Encounter: Payer: Self-pay | Admitting: Family Medicine

## 2023-05-21 ENCOUNTER — Encounter: Payer: Self-pay | Admitting: Family Medicine

## 2023-06-08 ENCOUNTER — Other Ambulatory Visit: Payer: Self-pay | Admitting: Family Medicine

## 2023-06-08 DIAGNOSIS — H6992 Unspecified Eustachian tube disorder, left ear: Secondary | ICD-10-CM

## 2023-06-09 NOTE — Telephone Encounter (Signed)
Requested Prescriptions  Pending Prescriptions Disp Refills   fluticasone (FLONASE) 50 MCG/ACT nasal spray [Pharmacy Med Name: FLUTICASONE PROP 50 MCG SPRAY] 48 mL 1    Sig: PLACE 2 SPRAYS INTO BOTH NOSTRILS DAILY. USE FOR 4-6 WEEKS THEN STOP AND USE SEASONALLY OR AS NEEDED.     Ear, Nose, and Throat: Nasal Preparations - Corticosteroids Passed - 06/08/2023  8:28 AM      Passed - Valid encounter within last 12 months    Recent Outpatient Visits           2 months ago Acute non-recurrent frontal sinusitis   [facility] [place], Netta Neat, DO   10 months ago Annual physical exam   [place] [facility] Smitty Cords, DO   1 year ago Episodic migraine   [facility] Smitty Cords, DO   1 year ago Generalized anxiety disorder with panic attacks   [place] [facility] Smitty Cords, DO   1 year ago Subacute frontal sinusitis   [place] [facility] [place], Netta Neat, [place]

## 2023-06-11 ENCOUNTER — Other Ambulatory Visit: Payer: Self-pay | Admitting: Family Medicine

## 2023-06-12 ENCOUNTER — Ambulatory Visit
Payer: BC Managed Care – PPO | Attending: Student in an Organized Health Care Education/Training Program | Admitting: Student in an Organized Health Care Education/Training Program

## 2023-06-12 ENCOUNTER — Encounter: Payer: Self-pay | Admitting: Student in an Organized Health Care Education/Training Program

## 2023-06-12 VITALS — BP 128/83 | HR 84 | Temp 98.3°F | Ht 65.0 in | Wt 200.0 lb

## 2023-06-12 DIAGNOSIS — M7918 Myalgia, other site: Secondary | ICD-10-CM | POA: Insufficient documentation

## 2023-06-12 DIAGNOSIS — G8929 Other chronic pain: Secondary | ICD-10-CM | POA: Diagnosis present

## 2023-06-12 DIAGNOSIS — M797 Fibromyalgia: Secondary | ICD-10-CM | POA: Diagnosis not present

## 2023-06-12 DIAGNOSIS — G894 Chronic pain syndrome: Secondary | ICD-10-CM | POA: Diagnosis present

## 2023-06-12 MED ORDER — BUPRENORPHINE 5 MCG/HR TD PTWK: 1.0000 | MEDICATED_PATCH | TRANSDERMAL | 0 refills | Status: AC

## 2023-06-12 MED ORDER — BUPRENORPHINE 7.5 MCG/HR TD PTWK: 1.0000 | MEDICATED_PATCH | TRANSDERMAL | 0 refills | Status: AC
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
## 2023-06-12 NOTE — Telephone Encounter (Signed)
Requested Prescriptions  Pending Prescriptions Disp Refills   nortriptyline (PAMELOR) 50 MG capsule [Pharmacy Med Name: NORTRIPTYLINE HCL 50 MG CAP] 180 capsule 0    Sig: TAKE 2 CAPSULES (100 MG TOTAL) BY MOUTH AT BEDTIME.     Psychiatry:  Antidepressants - Heterocyclics (TCAs) Passed - 06/11/2023 12:31 PM      Passed - Completed PHQ-2 or PHQ-9 in the last 360 days      Passed - Valid encounter within last 6 months    Recent Outpatient Visits           3 months ago Acute non-recurrent frontal sinusitis   [place] [facility] Smitty Cords, DO   11 months ago Annual physical exam   [place] [facility] Smitty Cords, DO   1 year ago Episodic migraine   [place] [facility] Smitty Cords, DO   1 year ago Generalized anxiety disorder with panic attacks   [place] [facility] Smitty Cords, DO   1 year ago Subacute frontal sinusitis   [place] [facility], [place] Ne[place], [place]

## 2023-06-14 ENCOUNTER — Other Ambulatory Visit: Payer: Self-pay | Admitting: Family Medicine

## 2023-06-14 DIAGNOSIS — H6992 Unspecified Eustachian tube disorder, left ear: Secondary | ICD-10-CM

## 2023-06-17 LAB — COMPLIANCE DRUG ANALYSIS, UR

## 2023-06-17 NOTE — Telephone Encounter (Signed)
Requested medication (s) are due for refill today:   Yes    Requested medication (s) are on the active medication list:   Yes  Future visit scheduled:   No    Seen 3 mo. Ago for sinus issues   Last ordered: 06/09/2023 16 g, 1 refill  Returned because a 90 day supply is being requested.    Requested Prescriptions  Pending Prescriptions Disp Refills   fluticasone (FLONASE) 50 MCG/ACT nasal spray [Pharmacy Med Name: FLUTICASONE PROP 50 MCG SPRAY] 48 mL 1    Sig: PLACE 2 SPRAYS INTO BOTH NOSTRILS DAILY. USE FOR 4-6 WEEKS THEN STOP AND USE SEASONALLY OR AS NEEDED.     Ear, Nose, and Throat: Nasal Preparations - Corticosteroids Passed - 06/14/2023  5:14 PM      Passed - Valid encounter within last 12 months    Recent Outpatient Visits           3 months ago Acute non-recurrent frontal sinusitis   [facility] Smitty Cords, DO   11 months ago Annual physical exam   [facility] Smitty Cords, DO   1 year ago Episodic migraine   [facility] Smitty Cords, DO   1 year ago Generalized anxiety disorder with panic attacks   [facility] Smitty Cords, DO   1 year ago Subacute frontal sinusitis   [facility] [place], Netta Neat, [place]

## 2023-06-20 ENCOUNTER — Encounter: Payer: Self-pay | Admitting: Family Medicine

## 2023-07-02 ENCOUNTER — Ambulatory Visit: Payer: BC Managed Care – PPO | Admitting: Family Medicine

## 2023-07-02 ENCOUNTER — Encounter: Payer: Self-pay | Admitting: Family Medicine

## 2023-07-02 VITALS — BP 138/72 | HR 82 | Ht 65.0 in | Wt 192.0 lb

## 2023-07-02 DIAGNOSIS — L820 Inflamed seborrheic keratosis: Secondary | ICD-10-CM | POA: Diagnosis not present

## 2023-07-02 DIAGNOSIS — K219 Gastro-esophageal reflux disease without esophagitis: Secondary | ICD-10-CM | POA: Diagnosis not present

## 2023-07-02 DIAGNOSIS — L570 Actinic keratosis: Secondary | ICD-10-CM

## 2023-07-02 MED ORDER — OMEPRAZOLE 20 MG PO CPDR: 20.0000 mg | DELAYED_RELEASE_CAPSULE | Freq: Every day | ORAL | 1 refills | Status: DC

## 2023-07-02 NOTE — Patient Instructions (Addendum)
Thank you for coming to the office today.  Referral sent to Derm. I am not worried about the skin spots, most of them are benign seborrheic keratoses, will not turn into skin cancer. The rough raised smaller ones look like sun damage spots or Actinic Keratoses. Likely these can be frozen or removed easily.  [facility] Address: [address] [place], [place] [address] Phone: [phone]  Dose increase Omeprazole to the 20mg  daily, new rx sent. Keep in touch and modify diet and if we need can consider double dose at 40mg  daily.  Please schedule a Follow-up Appointment to: Return if symptoms worsen or fail to improve.  If you have any other questions or concerns, please feel free to call the office or send a message through MyChart. You may also schedule an earlier appointment if necessary.  Additionally, you may be receiving a survey about your experience at our office within a few days to 1 week by e-mail or mail. We value your feedback.  Saralyn Pilar, DO [facility] [place], [place]

## 2023-07-02 NOTE — Progress Notes (Signed)
Subjective:    Patient ID: Denise Elliott, female    DOB: Jun 01, 1961, 62 y.o.   MRN: [id number]  Denise Elliott is a 62 y.o. female presenting on 07/02/2023 for Skin Problem (Spot behind right ear, on back, on arm and on chest) and Gastroesophageal Reflux   HPI  Skin Lesions Growths Reports several spots, rough raised lesions on arm and behind ear and back / chest area. No significant symptoms except one in her bra line on back itches her. Some bleed with scratched.  Requesting Referral Dermatology  GERD - spasms in throat She was on Omeprazole 10mg  daily for period of time and it was going well, then she stopped Omeprazole She switched to Atkins Diet for 1.5 month and had recurrent of GERD and esophageal spasms, food not going down in throat as well. Took Omeprazole for 1 month then took Prevacid then back on Omeprazole Improving diet to limit stomach acid     07/02/2023   11:30 AM 06/12/2023    1:57 PM 07/17/2022    8:21 AM  Depression screen PHQ 2/9  Decreased Interest 0 0 1  Down, Depressed, Hopeless 0 0 1  PHQ - 2 Score 0 0 2  Altered sleeping   0  Tired, decreased energy   1  Change in appetite   1  Feeling bad or failure about yourself    0  Trouble concentrating   0  Moving slowly or fidgety/restless   0  Suicidal thoughts   0  PHQ-9 Score   4  Difficult doing work/chores   Not difficult at all    Social History   Tobacco Use   Smoking status: Never   Smokeless tobacco: Never  Vaping Use   Vaping status: Never Used  Substance Use Topics   Alcohol use: Yes    Comment: socially   Drug use: No    Review of Systems Per HPI unless specifically indicated above     Objective:    BP 138/72   Pulse 82   Ht 5\' 5"  (1.651 m)   Wt 192 lb (87.1 kg)   LMP 02/03/2020   SpO2 97%   BMI 31.95 kg/m   Wt Readings from Last 3 Encounters:  07/02/23 192 lb (87.1 kg)  06/12/23 200 lb (90.7 kg)  03/12/23 200 lb 12.8 oz (91.1 kg)    Physical Exam Vitals and  nursing note reviewed.  Constitutional:      General: She is not in acute distress.    Appearance: Normal appearance. She is well-developed. She is not diaphoretic.     Comments: Well-appearing, comfortable, cooperative  HENT:     Head: Normocephalic and atraumatic.  Eyes:     General:        Right eye: No discharge.        Left eye: No discharge.     Conjunctiva/sclera: Conjunctivae normal.  Cardiovascular:     Rate and Rhythm: Normal rate.  Pulmonary:     Effort: Pulmonary effort is normal.  Skin:    General: Skin is warm and dry.     Findings: Lesion (see photos, AKs and SKs) present. No erythema or rash.  Neurological:     Mental Status: She is alert and oriented to person, place, and time.  Psychiatric:        Mood and Affect: Mood normal.        Behavior: Behavior normal.        Thought Content: Thought content normal.  Comments: Well groomed, good eye contact, normal speech and thoughts     Patient's photo of skin lesion behind R ear (photo taken 06/19/23)   Behind R ear, today    Left outer arm    Left upper chest / axillary    Left forearm, inner aspect    Back, R sided    Results for orders placed or performed in visit on 06/12/23  Compliance Drug Analysis, Ur  Result Value Ref Range   Summary Note       Assessment & Plan:   Problem List Items Addressed This Visit     GERD (gastroesophageal reflux disease)   Relevant Medications   omeprazole (PRILOSEC) 20 MG capsule   Other Visit Diagnoses     Seborrheic keratoses, inflamed    -  Primary   Relevant Orders   Ambulatory referral to Dermatology   Actinic keratoses       Relevant Orders   Ambulatory referral to Dermatology       Referral sent to Derm. I am not worried about the skin spots, most of them are benign seborrheic keratoses, will not turn into skin cancer. The rough raised smaller ones look like sun damage spots or Actinic Keratoses. Likely these can be frozen or removed  easily.  [facility] Address: [address] [place], [place] [address] Phone: [phone]  Dose increase Omeprazole to the 20mg  daily, new rx sent. Keep in touch and modify diet and if we need can consider double dose at 40mg  daily.  Orders Placed This Encounter  Procedures   Ambulatory referral to Dermatology    Referral Priority:   Routine    Referral Type:   Consultation    Referral Reason:   Specialty Services Required    Requested Specialty:   Dermatology    Number of Visits Requested:   1     Meds ordered this encounter  Medications   omeprazole (PRILOSEC) 20 MG capsule    Sig: Take 1 capsule (20 mg total) by mouth daily before breakfast.    Dispense:  90 capsule    Refill:  1      Follow up plan: Return if symptoms worsen or fail to improve.  Saralyn Pilar, DO [facility] 07/02/2023, 11:37 AM

## 2023-07-12 ENCOUNTER — Encounter: Payer: Self-pay | Admitting: Student in an Organized Health Care Education/Training Program

## 2023-07-31 ENCOUNTER — Encounter: Payer: BC Managed Care – PPO | Admitting: Student in an Organized Health Care Education/Training Program

## 2023-09-03 ENCOUNTER — Other Ambulatory Visit: Payer: Self-pay | Admitting: Family Medicine

## 2023-09-03 DIAGNOSIS — M797 Fibromyalgia: Secondary | ICD-10-CM

## 2023-09-03 DIAGNOSIS — I10 Essential (primary) hypertension: Secondary | ICD-10-CM

## 2023-09-04 ENCOUNTER — Telehealth: Payer: Self-pay | Admitting: Family Medicine

## 2023-09-04 ENCOUNTER — Other Ambulatory Visit: Payer: Self-pay | Admitting: Family Medicine

## 2023-09-04 DIAGNOSIS — I10 Essential (primary) hypertension: Secondary | ICD-10-CM

## 2023-09-04 DIAGNOSIS — E559 Vitamin D deficiency, unspecified: Secondary | ICD-10-CM

## 2023-09-04 DIAGNOSIS — Z0000 Encounter for general adult medical examination without abnormal findings: Secondary | ICD-10-CM

## 2023-09-04 DIAGNOSIS — E7800 Pure hypercholesterolemia, unspecified: Secondary | ICD-10-CM

## 2023-09-04 DIAGNOSIS — M797 Fibromyalgia: Secondary | ICD-10-CM

## 2023-09-04 DIAGNOSIS — R7309 Other abnormal glucose: Secondary | ICD-10-CM

## 2023-09-04 NOTE — Telephone Encounter (Signed)
Requested medication (s) are due for refill today: Yes  Requested medication (s) are on the active medication list: Yes  Last refill:  09/02/22  Future visit scheduled: Yes  Notes to clinic:  Unable to refill per protocol due to failed labs, no updated results.      Requested Prescriptions  Pending Prescriptions Disp Refills   lisinopril (ZESTRIL) 10 MG tablet [Pharmacy Med Name: LISINOPRIL 10 MG TABLET] 90 tablet 3    Sig: TAKE 1 TABLET BY MOUTH EVERY DAY     Cardiovascular:  ACE Inhibitors Failed - 09/03/2023 11:31 AM      Failed - Cr in normal range and within 180 days    Creat  Date Value Ref Range Status  07/17/2022 1.03 0.50 - 1.05 mg/dL Final         Failed - K in normal range and within 180 days    Potassium  Date Value Ref Range Status  07/17/2022 4.9 3.5 - 5.3 mmol/L Final         Passed - Patient is not pregnant      Passed - Last BP in normal range    BP Readings from Last 1 Encounters:  07/02/23 138/72         Passed - Valid encounter within last 6 months    Recent Outpatient Visits           2 months ago Seborrheic keratoses, inflamed   [facility], Netta Neat, DO   5 months ago Acute non-recurrent frontal sinusitis   [place] [facility] Althea Charon, Netta Neat, DO   1 year ago Annual physical exam   [place] [facility] Smitty Cords, DO   1 year ago Episodic migraine   [place] [facility] Smitty Cords, DO   1 year ago Generalized anxiety disorder with panic attacks   [place] [facility] [place], Netta Neat, DO       Future Appointments             In 3 months Althea Charon, Netta Neat, DO [place] [facility]             DULoxetine (CYMBALTA) 60 MG capsule [Pharmacy Med Name: DULOXETINE HCL DR 60 MG CAP] 90 capsule 1    Sig: TAKE 1 CAPSULE BY MOUTH EVERY DAY      Psychiatry: Antidepressants - SNRI - duloxetine Failed - 09/03/2023 11:31 AM      Failed - Cr in normal range and within 360 days    Creat  Date Value Ref Range Status  07/17/2022 1.03 0.50 - 1.05 mg/dL Final         Failed - eGFR is 30 or above and within 360 days    GFR, Est African American  Date Value Ref Range Status  12/22/2020 85 > OR = 60 mL/min/1.88m2 Final   GFR, Est Non African American  Date Value Ref Range Status  12/22/2020 73 > OR = 60 mL/min/1.49m2 Final   GFR, Estimated  Date Value Ref Range Status  05/06/2022 >60 >60 mL/min Final    Comment:    (NOTE) Calculated using the CKD-EPI Creatinine Equation (2021)    eGFR  Date Value Ref Range Status  07/17/2022 61 > OR = 60 mL/min/1.63m2 Final         Passed - Completed PHQ-2 or PHQ-9 in the last 360 days  Passed - Last BP in normal range    BP Readings from Last 1 Encounters:  07/02/23 138/72         Passed - Valid encounter within last 6 months    Recent Outpatient Visits           2 months ago Seborrheic keratoses, inflamed   [place] [facility] [place], Netta Neat, DO   5 months ago Acute non-recurrent frontal sinusitis   [place] [facility] [place], Netta Neat, DO   1 year ago Annual physical exam   [place] [facility] Smitty Cords, DO   1 year ago Episodic migraine   [place] [facility] Smitty Cords, DO   1 year ago Generalized anxiety disorder with panic attacks   [place] [facility] Althea Charon, Netta Neat, DO       Future Appointments             In 3 months Althea Charon, Netta Neat, DO [place] [facility]             hydrochlorothiazide (HYDRODIURIL) 12.5 MG tablet [Pharmacy Med Name: HYDROCHLOROTHIAZIDE 12.5 MG TB] 90 tablet 3    Sig: TAKE 1 TABLET BY MOUTH EVERY DAY     Cardiovascular: Diuretics - Thiazide Failed -  09/03/2023 11:31 AM      Failed - Cr in normal range and within 180 days    Creat  Date Value Ref Range Status  07/17/2022 1.03 0.50 - 1.05 mg/dL Final         Failed - K in normal range and within 180 days    Potassium  Date Value Ref Range Status  07/17/2022 4.9 3.5 - 5.3 mmol/L Final         Failed - Na in normal range and within 180 days    Sodium  Date Value Ref Range Status  07/17/2022 137 135 - 146 mmol/L Final  07/16/2019 140 134 - 144 mmol/L Final         Passed - Last BP in normal range    BP Readings from Last 1 Encounters:  07/02/23 138/72         Passed - Valid encounter within last 6 months    Recent Outpatient Visits           2 months ago Seborrheic keratoses, inflamed   [place] [facility] [place], Netta Neat, DO   5 months ago Acute non-recurrent frontal sinusitis   [place] [facility] Smitty Cords, DO   1 year ago Annual physical exam   [place] [facility] Smitty Cords, DO   1 year ago Episodic migraine   [place] [facility] Smitty Cords, DO   1 year ago Generalized anxiety disorder with panic attacks   [place] [place], Netta Neat, DO       Future Appointments             In 3 months Althea Charon, Netta Neat, DO  [facility], [facility]

## 2023-09-04 NOTE — Telephone Encounter (Signed)
Pt would like to know if Dr Kirtland Bouchard will put in orders for her to have labs done prior to her cpe appt on 12/23.  Pt's appt is at 1:40 pm and she cannot fast that long.

## 2023-09-04 NOTE — Telephone Encounter (Signed)
Medication Refill - Medication: lisinopril (ZESTRIL) 10 MG tablet , DULoxetine (CYMBALTA) 60 MG capsule,  hydrochlorothiazide (HYDRODIURIL) 12.5 MG table  Has the patient contacted their pharmacy? Yes.   Pt states she has family emergency and has to travel to [place] in the morning. Her mother in law is very sick.  She would like these scripts sent in today,so she can pick up tonite  Preferred Pharmacy (with phone number or street name): [id number] [place],  - [address]  Has the patient been seen for an appointment in the last year OR does the patient have an upcoming appointment? Yes.    Agent: Please be advised that RX refills may take up to 3 business days. We ask that you follow-up with your pharmacy.

## 2023-09-05 NOTE — Telephone Encounter (Signed)
He usually orders labs prior to the patient's appointment.

## 2023-09-05 NOTE — Telephone Encounter (Signed)
Yes, I have just signed orders for fasting labs and pended them as future. Please contact her back to schedule a Lab Only apt anytime 1 week before her scheduled apt in December. Thank you  Saralyn Pilar, DO [facility] 09/05/2023, 1:29 PM

## 2023-09-08 ENCOUNTER — Encounter: Payer: Self-pay | Admitting: Family Medicine

## 2023-09-08 ENCOUNTER — Ambulatory Visit: Payer: BC Managed Care – PPO | Admitting: Family Medicine

## 2023-09-08 VITALS — BP 130/80 | HR 88 | Ht 65.0 in | Wt 194.0 lb

## 2023-09-08 DIAGNOSIS — M79652 Pain in left thigh: Secondary | ICD-10-CM

## 2023-09-08 DIAGNOSIS — G5712 Meralgia paresthetica, left lower limb: Secondary | ICD-10-CM

## 2023-09-08 DIAGNOSIS — M25552 Pain in left hip: Secondary | ICD-10-CM | POA: Diagnosis not present

## 2023-09-08 DIAGNOSIS — M5432 Sciatica, left side: Secondary | ICD-10-CM | POA: Diagnosis not present

## 2023-09-08 DIAGNOSIS — M797 Fibromyalgia: Secondary | ICD-10-CM

## 2023-09-08 MED ORDER — PREDNISONE 20 MG PO TABS
ORAL_TABLET | ORAL | 0 refills | Status: DC
Start: 2023-09-08 — End: 2023-12-08

## 2023-09-08 NOTE — Telephone Encounter (Signed)
Requested Prescriptions  Refused Prescriptions Disp Refills   DULoxetine (CYMBALTA) 60 MG capsule      Sig: Take by mouth daily.     Psychiatry: Antidepressants - SNRI - duloxetine Failed - 09/04/2023  2:41 PM      Failed - Cr in normal range and within 360 days    Creat  Date Value Ref Range Status  07/17/2022 1.03 0.50 - 1.05 mg/dL Final         Failed - eGFR is 30 or above and within 360 days    GFR, Est African American  Date Value Ref Range Status  12/22/2020 85 > OR = 60 mL/min/1.48m2 Final   GFR, Est Non African American  Date Value Ref Range Status  12/22/2020 73 > OR = 60 mL/min/1.47m2 Final   GFR, Estimated  Date Value Ref Range Status  05/06/2022 >60 >60 mL/min Final    Comment:    (NOTE) Calculated using the CKD-EPI Creatinine Equation (2021)    eGFR  Date Value Ref Range Status  07/17/2022 61 > OR = 60 mL/min/1.53m2 Final         Passed - Completed PHQ-2 or PHQ-9 in the last 360 days      Passed - Last BP in normal range    BP Readings from Last 1 Encounters:  07/02/23 138/72         Passed - Valid encounter within last 6 months    Recent Outpatient Visits           2 months ago Seborrheic keratoses, inflamed   [place] [facility] [place], Netta Neat, DO   6 months ago Acute non-recurrent frontal sinusitis   [place] [facility] Althea Charon, Netta Neat, DO   1 year ago Annual physical exam   [facility] [facility] Smitty Cords, DO   1 year ago Episodic migraine   [place] [facility] Smitty Cords, DO   1 year ago Generalized anxiety disorder with panic attacks   [place] [facility] Smitty Cords, DO       Future Appointments             In 3 months Althea Charon, Netta Neat, DO [place] [facility]             lisinopril (ZESTRIL) 10 MG tablet 90 tablet 0    Sig: Take 1 tablet  (10 mg total) by mouth daily.     Cardiovascular:  ACE Inhibitors Failed - 09/04/2023  2:41 PM      Failed - Cr in normal range and within 180 days    Creat  Date Value Ref Range Status  07/17/2022 1.03 0.50 - 1.05 mg/dL Final         Failed - K in normal range and within 180 days    Potassium  Date Value Ref Range Status  07/17/2022 4.9 3.5 - 5.3 mmol/L Final         Passed - Patient is not pregnant      Passed - Last BP in normal range    BP Readings from Last 1 Encounters:  07/02/23 138/72         Passed - Valid encounter within last 6 months    Recent Outpatient Visits           2 months ago Seborrheic keratoses, inflamed   [facility] [facility] [place], Netta Neat,  DO   6 months ago Acute non-recurrent frontal sinusitis   [place] [facility] Althea Charon, Netta Neat, DO   1 year ago Annual physical exam   [place] [facility] Smitty Cords, DO   1 year ago Episodic migraine   [employer] [facility] Smitty Cords, DO   1 year ago Generalized anxiety disorder with panic attacks   [place] [facility] Smitty Cords, DO       Future Appointments             In 3 months Althea Charon, Netta Neat, DO [place]             hydrochlorothiazide (HYDRODIURIL) 12.5 MG tablet 90 tablet 0    Sig: Take 1 tablet (12.5 mg total) by mouth daily.     Cardiovascular: Diuretics - Thiazide Failed - 09/04/2023  2:41 PM      Failed - Cr in normal range and within 180 days    Creat  Date Value Ref Range Status  07/17/2022 1.03 0.50 - 1.05 mg/dL Final         Failed - K in normal range and within 180 days    Potassium  Date Value Ref Range Status  07/17/2022 4.9 3.5 - 5.3 mmol/L Final         Failed - Na in normal range and within 180 days    Sodium  Date Value Ref Range Status  07/17/2022 137 135 - 146  mmol/L Final  07/16/2019 140 134 - 144 mmol/L Final         Passed - Last BP in normal range    BP Readings from Last 1 Encounters:  07/02/23 138/72         Passed - Valid encounter within last 6 months    Recent Outpatient Visits           2 months ago Seborrheic keratoses, inflamed   [facility] Smitty Cords, DO   6 months ago Acute non-recurrent frontal sinusitis   [place] [facility] Smitty Cords, DO   1 year ago Annual physical exam   [place] [facility] Smitty Cords, DO   1 year ago Episodic migraine   [place] Smitty Cords, DO   1 year ago Generalized anxiety disorder with panic attacks   [place] [facility] [place], Netta Neat, DO       Future Appointments             In 3 months Althea Charon, Netta Neat, DO [place] [facility]

## 2023-09-08 NOTE — Progress Notes (Signed)
Subjective:    Patient ID: Denise Elliott, female    DOB: [birth year], 62 y.o.   MRN: [id number]  Denise Elliott is a 62 y.o. female presenting on 09/08/2023 for Back Pain (Lower back, left hip, left leg, 2.5 weeks, back "went out")  Discussed the use of AI scribe software for clinical note transcription with the patient, who gave verbal consent to proceed.   HPI  Left Hip Pain / Thigh Pain and Sciatica L sided Fibromyalgia  Reports onset 3 months ago, left hip pain worse with standing  2.5 weeks ago she had issue with back pain flare up, and "back went out" then she had sciatica flare with radiating pain down left side, also had a pain in upper thigh muscle when start walking  Currently she still experiences episodic sciatica symptoms. The worst symptoms now still are Left upper thigh and hip pain.  She admits one particular event 3 weeks prior with increased physical activity, stocking shelves for food bank/drive and thinks this may have contributed.  Taking increased amount of Advil 200mg  x 3 = 600mg  twice a day, can take 2 or 3 in PM  Continues Duloxetine 60mg  daily  She is followed by Pain Management [facility] Dr Cherylann Ratel and [facility].  She has failed Lyrica and Gabapentin previously. Side effects swelling.       09/08/2023    1:21 PM 07/02/2023   11:30 AM 06/12/2023    1:57 PM  Depression screen PHQ 2/9  Decreased Interest 0 0 0  Down, Depressed, Hopeless 0 0 0  PHQ - 2 Score 0 0 0  Altered sleeping 0    Tired, decreased energy 1    Change in appetite 0    Feeling bad or failure about yourself  0    Trouble concentrating 0    Moving slowly or fidgety/restless 0    Suicidal thoughts 0    PHQ-9 Score 1    Difficult doing work/chores Not difficult at all      Social History   Tobacco Use   Smoking status: Never   Smokeless tobacco: Never  Vaping Use   Vaping status: Never Used  Substance Use Topics   Alcohol use: Yes    Comment: socially   Drug use:  No    Review of Systems Per HPI unless specifically indicated above     Objective:    BP 130/80   Pulse 88   Ht 5\' 5"  (1.651 m)   Wt 194 lb (88 kg)   LMP 02/03/2020   BMI 32.28 kg/m   Wt Readings from Last 3 Encounters:  09/08/23 194 lb (88 kg)  07/02/23 192 lb (87.1 kg)  06/12/23 200 lb (90.7 kg)    Physical Exam Vitals and nursing note reviewed.  Constitutional:      General: She is not in acute distress.    Appearance: Normal appearance. She is well-developed. She is not diaphoretic.     Comments: Well-appearing, comfortable, cooperative  HENT:     Head: Normocephalic and atraumatic.  Eyes:     General:        Right eye: No discharge.        Left eye: No discharge.     Conjunctiva/sclera: Conjunctivae normal.  Cardiovascular:     Rate and Rhythm: Normal rate.  Pulmonary:     Effort: Pulmonary effort is normal.  Musculoskeletal:     Comments: Seated Hip exam Left hip with reproduced pain with forward flexion and knee  extension, also with internal rotation of hip but non tender external rotation. Antalgic gait with ambulation. Pain over direct pressure of greater trochanter.  Skin:    General: Skin is warm and dry.     Findings: No erythema or rash.  Neurological:     Mental Status: She is alert and oriented to person, place, and time.  Psychiatric:        Mood and Affect: Mood normal.        Behavior: Behavior normal.        Thought Content: Thought content normal.     Comments: Well groomed, good eye contact, normal speech and thoughts    Results for orders placed or performed in visit on 06/12/23  Compliance Drug Analysis, Ur  Result Value Ref Range   Summary Note       Assessment & Plan:   Problem List Items Addressed This Visit     Fibromyalgia   Relevant Medications   predniSONE (DELTASONE) 20 MG tablet   Other Visit Diagnoses     Left hip pain    -  Primary   Left thigh pain       Sciatica of left side       Relevant Medications    predniSONE (DELTASONE) 20 MG tablet   Meralgia paresthetica of left side       Relevant Medications   predniSONE (DELTASONE) 20 MG tablet       Assessment and Plan    Left Hip and Thigh Pain Pain initiated in the left hip, progressed to lower back, and now radiates to the thigh, suggestive of sciatica and Meralgia Paresthetica. Pain exacerbated by physical activity and obesity. No evidence of fracture or joint issues on physical examination. -Start Prednisone 7-day taper, pausing Ibuprofen during this period. -Continue Duloxetine 60mg  for fibromyalgia. -Advise rest and caution with overuse of hip and back.  Fibromyalgia Chronic condition managed with Duloxetine 60mg . -Continue Duloxetine 60mg .  If symptoms persist or worsen -Consider walk-in X-ray. L Hip -Consider return to pain management/neurology for further testing such as MRI or nerve conduction study.        Meds ordered this encounter  Medications   predniSONE (DELTASONE) 20 MG tablet    Sig: Take daily with food. Start with 60mg  (3 pills) x 2 days, then reduce to 40mg  (2 pills) x 2 days, then 20mg  (1 pill) x 3 days    Dispense:  13 tablet    Refill:  0      Follow up plan: Return if symptoms worsen or fail to improve.   Saralyn Pilar, DO [facility] 09/08/2023, 1:33 PM

## 2023-09-08 NOTE — Patient Instructions (Addendum)
Thank you for coming to the office today.  Start Prednisone 7 day taper Pause Ibuprofen while on this, hold anti inflammatories  Okay to take Tylenol and other medicines, Duloxetine  Rest / caution with over use with the hip and back.  Most likely nerve impingement or sciatica on Left and meralgia paresthetica nerve pinching.  If still difficulty with symptoms and pain, we can do a walk in X-ray next week.  And or can pursue return to Pain Management / Neuro for other testing such as MRI or Nerve evaluation.   Please schedule a Follow-up Appointment to: Return if symptoms worsen or fail to improve.  If you have any other questions or concerns, please feel free to call the office or send a message through MyChart. You may also schedule an earlier appointment if necessary.  Additionally, you may be receiving a survey about your experience at our office within a few days to 1 week by e-mail or mail. We value your feedback.  Saralyn Pilar, DO [facility], [place]

## 2023-09-15 ENCOUNTER — Encounter: Payer: Self-pay | Admitting: Family Medicine

## 2023-09-15 DIAGNOSIS — M5432 Sciatica, left side: Secondary | ICD-10-CM

## 2023-09-15 DIAGNOSIS — M25552 Pain in left hip: Secondary | ICD-10-CM

## 2023-09-22 ENCOUNTER — Ambulatory Visit
Admission: RE | Admit: 2023-09-22 | Discharge: 2023-09-22 | Disposition: A | Discharge status: Home / Self Care | Payer: BC Managed Care – PPO | Attending: Family Medicine | Admitting: Family Medicine

## 2023-09-22 ENCOUNTER — Ambulatory Visit
Admission: RE | Admit: 2023-09-22 | Discharge: 2023-09-22 | Disposition: A | Discharge status: Home / Self Care | Payer: BC Managed Care – PPO | Source: Ambulatory Visit | Attending: Family Medicine | Admitting: Family Medicine

## 2023-09-22 DIAGNOSIS — M25552 Pain in left hip: Secondary | ICD-10-CM | POA: Insufficient documentation

## 2023-09-22 DIAGNOSIS — M5432 Sciatica, left side: Secondary | ICD-10-CM | POA: Diagnosis present

## 2023-09-30 ENCOUNTER — Other Ambulatory Visit: Payer: Self-pay | Admitting: Family Medicine

## 2023-09-30 DIAGNOSIS — Z1231 Encounter for screening mammogram for malignant neoplasm of breast: Secondary | ICD-10-CM

## 2023-10-08 ENCOUNTER — Ambulatory Visit
Admission: RE | Admit: 2023-10-08 | Discharge: 2023-10-08 | Disposition: A | Discharge status: Home / Self Care | Payer: BC Managed Care – PPO | Source: Ambulatory Visit | Attending: Family Medicine | Admitting: Family Medicine

## 2023-10-08 DIAGNOSIS — Z1231 Encounter for screening mammogram for malignant neoplasm of breast: Secondary | ICD-10-CM | POA: Diagnosis present

## 2023-10-10 ENCOUNTER — Other Ambulatory Visit: Payer: Self-pay | Admitting: Family Medicine

## 2023-10-10 DIAGNOSIS — R928 Other abnormal and inconclusive findings on diagnostic imaging of breast: Secondary | ICD-10-CM

## 2023-10-14 ENCOUNTER — Ambulatory Visit
Admission: RE | Admit: 2023-10-14 | Discharge: 2023-10-14 | Disposition: A | Discharge status: Home / Self Care | Payer: BC Managed Care – PPO | Source: Ambulatory Visit | Attending: Family Medicine | Admitting: Family Medicine

## 2023-10-14 DIAGNOSIS — R928 Other abnormal and inconclusive findings on diagnostic imaging of breast: Secondary | ICD-10-CM | POA: Insufficient documentation

## 2023-11-25 ENCOUNTER — Other Ambulatory Visit: Payer: Self-pay | Admitting: Family Medicine

## 2023-11-25 DIAGNOSIS — M797 Fibromyalgia: Secondary | ICD-10-CM

## 2023-11-25 DIAGNOSIS — I10 Essential (primary) hypertension: Secondary | ICD-10-CM

## 2023-11-25 MED ORDER — LISINOPRIL 10 MG PO TABS: 10.0000 mg | ORAL_TABLET | Freq: Every day | ORAL | 0 refills | Status: DC

## 2023-11-25 MED ORDER — DULOXETINE HCL 60 MG PO CPEP: 60.0000 mg | ORAL_CAPSULE | Freq: Every day | ORAL | 1 refills | Status: DC

## 2023-11-25 MED ORDER — NORTRIPTYLINE HCL 50 MG PO CAPS: 100.0000 mg | ORAL_CAPSULE | Freq: Every day | ORAL | 0 refills | Status: DC

## 2023-11-25 MED ORDER — HYDROCHLOROTHIAZIDE 12.5 MG PO TABS: 12.5000 mg | ORAL_TABLET | Freq: Every day | ORAL | 0 refills | Status: DC

## 2023-12-01 ENCOUNTER — Other Ambulatory Visit: Payer: BC Managed Care – PPO

## 2023-12-01 DIAGNOSIS — R7309 Other abnormal glucose: Secondary | ICD-10-CM

## 2023-12-01 DIAGNOSIS — E559 Vitamin D deficiency, unspecified: Secondary | ICD-10-CM

## 2023-12-01 DIAGNOSIS — E7800 Pure hypercholesterolemia, unspecified: Secondary | ICD-10-CM

## 2023-12-01 DIAGNOSIS — Z0000 Encounter for general adult medical examination without abnormal findings: Secondary | ICD-10-CM

## 2023-12-01 DIAGNOSIS — I10 Essential (primary) hypertension: Secondary | ICD-10-CM

## 2023-12-02 LAB — LIPID PANEL
Cholesterol: 295 mg/dL — ABNORMAL HIGH (ref <200)
HDL: 56 mg/dL (ref >=50)
LDL Cholesterol (Calc): 172 mg/dL — ABNORMAL HIGH
Non-HDL Cholesterol (Calc): 239 mg/dL — ABNORMAL HIGH (ref <130)
Total CHOL/HDL Ratio: 5.3 (calc) — ABNORMAL HIGH (ref <5.0)
Triglycerides: 395 mg/dL — ABNORMAL HIGH (ref <150)

## 2023-12-02 LAB — CBC WITH DIFFERENTIAL/PLATELET
Absolute Lymphocytes: 1650 {cells}/uL (ref 850–3900)
Absolute Monocytes: 270 {cells}/uL (ref 200–950)
Basophils Absolute: 88 {cells}/uL (ref 0–200)
Basophils Relative: 1.6 %
Eosinophils Absolute: 402 {cells}/uL (ref 15–500)
Eosinophils Relative: 7.3 %
HCT: 45.4 % — ABNORMAL HIGH (ref 35.0–45.0)
Hemoglobin: 14.9 g/dL (ref 11.7–15.5)
MCH: 30 pg (ref 27.0–33.0)
MCHC: 32.8 g/dL (ref 32.0–36.0)
MCV: 91.5 fL (ref 80.0–100.0)
MPV: 9.6 fL (ref 7.5–12.5)
Monocytes Relative: 4.9 %
Neutro Abs: 3091 {cells}/uL (ref 1500–7800)
Neutrophils Relative %: 56.2 %
Platelets: 271 10*3/uL (ref 140–400)
RBC: 4.96 10*6/uL (ref 3.80–5.10)
RDW: 12.4 % (ref 11.0–15.0)
Total Lymphocyte: 30 %
WBC: 5.5 10*3/uL (ref 3.8–10.8)

## 2023-12-02 LAB — COMPLETE METABOLIC PANEL WITH GFR
AG Ratio: 1.6 (calc) (ref 1.0–2.5)
ALT: 25 U/L (ref 6–29)
AST: 18 U/L (ref 10–35)
Albumin: 4.2 g/dL (ref 3.6–5.1)
Alkaline phosphatase (APISO): 100 U/L (ref 37–153)
BUN: 13 mg/dL (ref 7–25)
CO2: 29 mmol/L (ref 20–32)
Calcium: 9.5 mg/dL (ref 8.6–10.4)
Chloride: 100 mmol/L (ref 98–110)
Creat: 0.89 mg/dL (ref 0.50–1.05)
Globulin: 2.6 g/dL (ref 1.9–3.7)
Glucose, Bld: 93 mg/dL (ref 65–99)
Potassium: 4.6 mmol/L (ref 3.5–5.3)
Sodium: 141 mmol/L (ref 135–146)
Total Bilirubin: 0.4 mg/dL (ref 0.2–1.2)
Total Protein: 6.8 g/dL (ref 6.1–8.1)
eGFR: 72 mL/min/{1.73_m2} (ref >=60)

## 2023-12-02 LAB — HEMOGLOBIN A1C
Hgb A1c MFr Bld: 5.7 %{Hb} — ABNORMAL HIGH (ref <5.7)
Mean Plasma Glucose: 117 mg/dL
eAG (mmol/L): 6.5 mmol/L

## 2023-12-02 LAB — VITAMIN D 25 HYDROXY (VIT D DEFICIENCY, FRACTURES): Vit D, 25-Hydroxy: 25 ng/mL — ABNORMAL LOW (ref 30–100)

## 2023-12-02 LAB — TSH: TSH: 1.64 m[IU]/L (ref 0.40–4.50)

## 2023-12-08 ENCOUNTER — Encounter: Payer: Self-pay | Admitting: Family Medicine

## 2023-12-08 ENCOUNTER — Ambulatory Visit (INDEPENDENT_AMBULATORY_CARE_PROVIDER_SITE_OTHER): Payer: BC Managed Care – PPO | Admitting: Family Medicine

## 2023-12-08 ENCOUNTER — Other Ambulatory Visit: Payer: Self-pay | Admitting: Family Medicine

## 2023-12-08 VITALS — BP 110/76 | HR 81 | Ht 65.0 in | Wt 188.0 lb

## 2023-12-08 DIAGNOSIS — I10 Essential (primary) hypertension: Secondary | ICD-10-CM | POA: Diagnosis not present

## 2023-12-08 DIAGNOSIS — K219 Gastro-esophageal reflux disease without esophagitis: Secondary | ICD-10-CM

## 2023-12-08 DIAGNOSIS — E559 Vitamin D deficiency, unspecified: Secondary | ICD-10-CM

## 2023-12-08 DIAGNOSIS — F410 Panic disorder [episodic paroxysmal anxiety] without agoraphobia: Secondary | ICD-10-CM

## 2023-12-08 DIAGNOSIS — R7309 Other abnormal glucose: Secondary | ICD-10-CM

## 2023-12-08 DIAGNOSIS — Z0000 Encounter for general adult medical examination without abnormal findings: Secondary | ICD-10-CM | POA: Diagnosis not present

## 2023-12-08 DIAGNOSIS — M797 Fibromyalgia: Secondary | ICD-10-CM

## 2023-12-08 DIAGNOSIS — E7800 Pure hypercholesterolemia, unspecified: Secondary | ICD-10-CM

## 2023-12-08 DIAGNOSIS — F411 Generalized anxiety disorder: Secondary | ICD-10-CM

## 2023-12-08 MED ORDER — LISINOPRIL 10 MG PO TABS: 10.0000 mg | ORAL_TABLET | Freq: Every day | ORAL | 3 refills | Status: DC

## 2023-12-08 MED ORDER — OMEPRAZOLE 20 MG PO CPDR: 20.0000 mg | DELAYED_RELEASE_CAPSULE | Freq: Every day | ORAL | 3 refills | Status: AC

## 2023-12-08 MED ORDER — VITAMIN D3 125 MCG (5000 UT) PO CAPS: 5000.0000 [IU] | ORAL_CAPSULE | Freq: Every day | ORAL | 0 refills | Status: AC

## 2023-12-08 MED ORDER — HYDROCHLOROTHIAZIDE 12.5 MG PO TABS: 12.5000 mg | ORAL_TABLET | Freq: Every day | ORAL | 3 refills | Status: DC

## 2023-12-08 MED ORDER — NORTRIPTYLINE HCL 50 MG PO CAPS
100.0000 mg | ORAL_CAPSULE | Freq: Every day | ORAL | 3 refills | Status: DC
Start: 2023-12-08 — End: 2023-12-08

## 2023-12-08 NOTE — Progress Notes (Signed)
Subjective:    Patient ID: Denise Elliott, female    DOB: 07/19/1961, 62 y.o.   MRN: [id number]  Denise Elliott is a 62 y.o. female presenting on 12/08/2023 for Annual Exam and Fibromyalgia   HPI  Discussed the use of AI scribe software for clinical note transcription with the patient, who gave verbal consent to proceed.  History of Present Illness         Here for Annual Physical and Lab Review  The patient, with a history of fibromyalgia, presented for an annual check-up. She reported no new health issues since the last visit. The patient mentioned a recent experience with COVID-19, which she felt had significantly impacted her energy levels and contributed to weight gain. She reported a previous daily walking routine of three miles, which had been disrupted due to the pandemic. The patient has recently acquired a treadmill and has resumed physical activity with the goal of losing fifty pounds before a planned trip to [place] in April.  Goal to lose 50 lbs by April 2025  Fibromyalgia Chronic problem On medication Still experiences painful episodic flares and daily pain.  Vitamin D Insufficiency Last lab 25 She will take Vitamin D3  CHRONIC HTN: Reports controlled BP Current Meds - hydrochlorothiazide 12.5mg  daily, Lisinopril 10mg  daily   Reports good compliance, took meds today. Tolerating well, w/o complaints. Denies CP, dyspnea, HA, edema, dizziness / lightheadedness   Headaches, Episodic Migraine Still on Duloxetine 60mg  and Nortriptyline 50mg  on med rec today Requesting all refills for future. Duloxetine does not seem to help her arthritis pain significantly Taking Advil Ibuprofen 200mg s taking 2-3 pills twice a day. On Triptan PRN some relief, less headaches now    GAD with Panic Attacks   HYPERLIPIDEMIA: - Reports concerns. Last lipid panel 11/2023, elevated LDL 170s Prior history on statin but intolerance to myalgia Improving diet goal at this time and  improve exercise.    Health Maintenance: Due for Shingles vaccine.  Due for Prevnar-20 vaccine in the future.  Colonoscopy done 05/26/20, negative repeat 5 years.     12/08/2023    1:18 PM 09/08/2023    1:21 PM 07/02/2023   11:30 AM  Depression screen PHQ 2/9  Decreased Interest 0 0 0  Down, Depressed, Hopeless 0 0 0  PHQ - 2 Score 0 0 0  Altered sleeping 0 0   Tired, decreased energy 2 1   Change in appetite 1 0   Feeling bad or failure about yourself  0 0   Trouble concentrating 1 0   Moving slowly or fidgety/restless 0 0   Suicidal thoughts 0 0   PHQ-9 Score 4 1   Difficult doing work/chores  Not difficult at all        12/08/2023    1:18 PM 09/08/2023    1:21 PM 07/02/2023   11:30 AM 07/17/2022    8:21 AM  GAD 7 : Generalized Anxiety Score  Nervous, Anxious, on Edge 1 0 1 0  Control/stop worrying 0 0 0 1  Worry too much - different things 0 0 1 0  Trouble relaxing 1 0 0 1  Restless 0 0 0 0  Easily annoyed or irritable 0 0 0 0  Afraid - awful might happen 0 0 0 0  Total GAD 7 Score 2 0 2 2  Anxiety Difficulty  Not difficult at all  Not difficult at all     Past Medical History:  Diagnosis Date   Allergy  Anxiety    Arthritis    in neck, hands   Cervical dystonia    bone spurs in neck   Complication of anesthesia    versed allergy   Depression    Fibromyalgia    muscle pain   Fibromyalgia    GERD (gastroesophageal reflux disease)    Hx of, no issues at this point   Glaucoma    closed angle glaucoma both eyes, laser treatment in past   Goiter    thyroid   Headache    sinus headaches, inner ear imbalance, light sensitivity   History of kidney stones    HLD (hyperlipidemia)    statin intolerance   Hypertension    controlled on meds   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   Plantar fasciitis    Sleep apnea    used CPAP, lost 60 pounds no issue now   Tinnitus    wears hearing aids   Varicose veins of both  legs with edema    Vertigo    inner ear imbalance   Past Surgical History:  Procedure Laterality Date   CHOLECYSTECTOMY     COLONOSCOPY WITH PROPOFOL N/A 05/26/2020   Procedure: COLONOSCOPY WITH PROPOFOL;  Surgeon: Midge Minium, MD;  Location: [facility];  Service: Endoscopy;  Laterality: N/A;  priority 4   COSMETIC SURGERY Left    broken cheek bone   GALLBLADDER SURGERY     POLYPECTOMY  05/26/2020   Procedure: POLYPECTOMY;  Surgeon: Midge Minium, MD;  Location: [facility];  Service: Endoscopy;;   TONSILLECTOMY     TONSILLECTOMY     Social History   Socioeconomic History   Marital status: Married    Spouse name: Not on file   Number of children: Not on file   Years of education: Not on file   Highest education level: Not on file  Occupational History   Not on file  Tobacco Use   Smoking status: Never   Smokeless tobacco: Never  Vaping Use   Vaping status: Never Used  Substance and Sexual Activity   Alcohol use: Yes    Comment: socially   Drug use: No   Sexual activity: Yes    Birth control/protection: Post-menopausal  Other Topics Concern   Not on file  Social History Narrative   Not on file   Social Drivers of Health   Financial Resource Strain: Not on file  Food Insecurity: Not on file  Transportation Needs: Not on file  Physical Activity: Not on file  Stress: Not on file  Social Connections: Not on file  Intimate Partner Violence: Not on file   Family History  Problem Relation Age of Onset   Colon polyps Mother    Hypertension Mother    Goiter Mother    Anuerysm Father    AAA (abdominal aortic aneurysm) Father    Alcohol abuse Father    Healthy Sister    Breast cancer Sister 21   Healthy Sister    Healthy Sister    Vision loss Maternal Grandmother    Liver cancer Paternal Grandfather    Alcohol abuse Paternal Grandfather    Anuerysm Paternal Grandfather    Healthy Daughter    Healthy Son    Healthy Son    Colon cancer Maternal  Uncle    Bladder Cancer Neg Hx    Kidney cancer Neg Hx    Prostate cancer Neg Hx    Current Outpatient Medications on File Prior  to Visit  Medication Sig   albuterol (PROAIR HFA) 108 (90 Base) MCG/ACT inhaler Inhale 1-2 puffs into the lungs every 6 (six) hours as needed for wheezing or shortness of breath.   DULoxetine (CYMBALTA) 60 MG capsule Take 1 capsule (60 mg total) by mouth daily.   fluticasone (FLONASE) 50 MCG/ACT nasal spray PLACE 2 SPRAYS INTO BOTH NOSTRILS DAILY. USE FOR 4-6 WEEKS THEN STOP AND USE SEASONALLY OR AS NEEDED.   ondansetron (ZOFRAN-ODT) 4 MG disintegrating tablet Take 1 tablet (4 mg total) by mouth every 8 (eight) hours as needed for nausea or vomiting.   SUMAtriptan (IMITREX) 100 MG tablet Take by mouth.   No current facility-administered medications on file prior to visit.    Review of Systems  Constitutional:  Negative for activity change, appetite change, chills, diaphoresis, fatigue and fever.  HENT:  Negative for congestion and hearing loss.   Eyes:  Negative for visual disturbance.  Respiratory:  Negative for cough, chest tightness, shortness of breath and wheezing.   Cardiovascular:  Negative for chest pain, palpitations and leg swelling.  Gastrointestinal:  Negative for abdominal pain, constipation, diarrhea, nausea and vomiting.  Genitourinary:  Negative for dysuria, frequency and hematuria.  Musculoskeletal:  Negative for arthralgias and neck pain.  Skin:  Negative for rash.  Neurological:  Negative for dizziness, weakness, light-headedness, numbness and headaches.  Hematological:  Negative for adenopathy.  Psychiatric/Behavioral:  Negative for behavioral problems, dysphoric mood and sleep disturbance.    Per HPI unless specifically indicated above     Objective:    BP 110/76   Pulse 81   Ht 5\' 5"  (1.651 m)   Wt 188 lb (85.3 kg)   LMP 02/03/2020   SpO2 98%   BMI 31.28 kg/m   Wt Readings from Last 3 Encounters:  12/08/23 188 lb (85.3 kg)   09/08/23 194 lb (88 kg)  07/02/23 192 lb (87.1 kg)    Physical Exam Vitals and nursing note reviewed.  Constitutional:      General: She is not in acute distress.    Appearance: She is well-developed. She is not diaphoretic.     Comments: Well-appearing, comfortable, cooperative  HENT:     Head: Normocephalic and atraumatic.  Eyes:     General:        Right eye: No discharge.        Left eye: No discharge.     Conjunctiva/sclera: Conjunctivae normal.     Pupils: Pupils are equal, round, and reactive to light.  Neck:     Thyroid: No thyromegaly.     Vascular: No carotid bruit.  Cardiovascular:     Rate and Rhythm: Normal rate and regular rhythm.     Pulses: Normal pulses.     Heart sounds: Normal heart sounds. No murmur heard. Pulmonary:     Effort: Pulmonary effort is normal. No respiratory distress.     Breath sounds: Normal breath sounds. No wheezing or rales.  Abdominal:     General: Bowel sounds are normal. There is no distension.     Palpations: Abdomen is soft. There is no mass.     Tenderness: There is no abdominal tenderness.  Musculoskeletal:        General: No tenderness. Normal range of motion.     Cervical back: Normal range of motion and neck supple.     Right lower leg: No edema.     Left lower leg: No edema.     Comments: Upper / Lower Extremities: - Normal  muscle tone, strength bilateral upper extremities 5/5, lower extremities 5/5  Lymphadenopathy:     Cervical: No cervical adenopathy.  Skin:    General: Skin is warm and dry.     Findings: No erythema or rash.  Neurological:     Mental Status: She is alert and oriented to person, place, and time.     Comments: Distal sensation intact to light touch all extremities  Psychiatric:        Mood and Affect: Mood normal.        Behavior: Behavior normal.        Thought Content: Thought content normal.     Comments: Well groomed, good eye contact, normal speech and thoughts     Results for orders  placed or performed in visit on 12/01/23  TSH   Collection Time: 12/01/23  8:16 AM  Result Value Ref Range   TSH 1.64 0.40 - 4.50 mIU/L  VITAMIN D 25 Hydroxy (Vit-D Deficiency, Fractures)   Collection Time: 12/01/23  8:16 AM  Result Value Ref Range   Vit D, 25-Hydroxy 25 (L) 30 - 100 ng/mL  CBC with Differential/Platelet   Collection Time: 12/01/23  8:16 AM  Result Value Ref Range   WBC 5.5 3.8 - 10.8 Thousand/uL   RBC 4.96 3.80 - 5.10 Million/uL   Hemoglobin 14.9 11.7 - 15.5 g/dL   HCT 66.4 (H) 40.3 - 47.4 %   MCV 91.5 80.0 - 100.0 fL   MCH 30.0 27.0 - 33.0 pg   MCHC 32.8 32.0 - 36.0 g/dL   RDW 25.9 56.3 - 87.5 %   Platelets 271 140 - 400 Thousand/uL   MPV 9.6 7.5 - 12.5 fL   Neutro Abs 3,091 1,500 - 7,800 cells/uL   Absolute Lymphocytes 1,650 850 - 3,900 cells/uL   Absolute Monocytes 270 200 - 950 cells/uL   Eosinophils Absolute 402 15 - 500 cells/uL   Basophils Absolute 88 0 - 200 cells/uL   Neutrophils Relative % 56.2 %   Total Lymphocyte 30.0 %   Monocytes Relative 4.9 %   Eosinophils Relative 7.3 %   Basophils Relative 1.6 %  COMPLETE METABOLIC PANEL WITH GFR   Collection Time: 12/01/23  8:16 AM  Result Value Ref Range   Glucose, Bld 93 65 - 99 mg/dL   BUN 13 7 - 25 mg/dL   Creat 6.43 3.29 - 5.18 mg/dL   eGFR 72 > OR = 60 AC/ZYS/0.63K1   BUN/Creatinine Ratio SEE NOTE: 6 - 22 (calc)   Sodium 141 135 - 146 mmol/L   Potassium 4.6 3.5 - 5.3 mmol/L   Chloride 100 98 - 110 mmol/L   CO2 29 20 - 32 mmol/L   Calcium 9.5 8.6 - 10.4 mg/dL   Total Protein 6.8 6.1 - 8.1 g/dL   Albumin 4.2 3.6 - 5.1 g/dL   Globulin 2.6 1.9 - 3.7 g/dL (calc)   AG Ratio 1.6 1.0 - 2.5 (calc)   Total Bilirubin 0.4 0.2 - 1.2 mg/dL   Alkaline phosphatase (APISO) 100 37 - 153 U/L   AST 18 10 - 35 U/L   ALT 25 6 - 29 U/L  Lipid panel   Collection Time: 12/01/23  8:16 AM  Result Value Ref Range   Cholesterol 295 (H) <200 mg/dL   HDL 56 > OR = 50 mg/dL   Triglycerides 601 (H) <150 mg/dL   LDL  Cholesterol (Calc) 172 (H) mg/dL (calc)   Total CHOL/HDL Ratio 5.3 (H) <5.0 (calc)   Non-HDL Cholesterol (  Calc) 239 (H) <130 mg/dL (calc)  Hemoglobin Z6X   Collection Time: 12/01/23  8:16 AM  Result Value Ref Range   Hgb A1c MFr Bld 5.7 (H) <5.7 % of total Hgb   Mean Plasma Glucose 117 mg/dL   eAG (mmol/L) 6.5 mmol/L      Assessment & Plan:   Problem List Items Addressed This Visit     Fibromyalgia   GERD (gastroesophageal reflux disease)   Relevant Medications   omeprazole (PRILOSEC) 20 MG capsule   Other Visit Diagnoses       Annual physical exam    -  Primary     Essential hypertension       Relevant Medications   lisinopril (ZESTRIL) 10 MG tablet   hydrochlorothiazide (HYDRODIURIL) 12.5 MG tablet     Generalized anxiety disorder with panic attacks         Vitamin D deficiency            Updated Health Maintenance information Reviewed recent lab results with patient Encouraged improvement to lifestyle with diet and exercise Goal of weight loss   Hyperlipidemia LDL cholesterol elevated at 172. Discussed the option of starting statin therapy, but patient prefers to attempt lifestyle modifications first. She has failed prior statin. Unsure which ones, tried 2 in past. Will check records. She may reconsider intermittent dosing or [id number] -Repeat lipid panel in 6 months.  Vitamin D Deficiency Vitamin D level slightly low at 25. -Start over-the-counter Vitamin D3 5000 units daily for 12 weeks. Then resume OTC Vitamin D3 2,000 unit daily maintenance  Asthma Patient reports occasional wheezing. No changes in management discussed. -Continue current management.  General Health Maintenance -Discussed upcoming changes in pneumonia vaccine recommendations. Plan to revisit in the future. -Continue with current screening schedules for mammogram and colonoscopy. -Schedule follow-up appointment in 6 months.      Migraines, episodic On Triptan AS NEEDED Has preventative  therapy SNRI, TCA Confirmed after apt, that patient is actually only taking Nortriptyline 50mg  nightly not x 2 = 100mg , I called her and confirmed, she updated her instructions.    No orders of the defined types were placed in this encounter.   Meds ordered this encounter  Medications   lisinopril (ZESTRIL) 10 MG tablet    Sig: Take 1 tablet (10 mg total) by mouth daily.    Dispense:  90 tablet    Refill:  3    Patient not ready for fill now, as it was just filled recently. Please keep additional refills on file for the future.   hydrochlorothiazide (HYDRODIURIL) 12.5 MG tablet    Sig: Take 1 tablet (12.5 mg total) by mouth daily.    Dispense:  90 tablet    Refill:  3    Patient not ready for fill now, as it was just filled recently. Please keep additional refills on file for the future.   DISCONTD: nortriptyline (PAMELOR) 50 MG capsule    Sig: Take 2 capsules (100 mg total) by mouth at bedtime.    Dispense:  180 capsule    Refill:  3    Patient not ready for fill now, as it was just filled recently. Please keep additional refills on file for the future.   omeprazole (PRILOSEC) 20 MG capsule    Sig: Take 1 capsule (20 mg total) by mouth daily before breakfast.    Dispense:  90 capsule    Refill:  3    Patient not ready for fill now, as it was  just filled recently. Please keep additional refills on file for the future.   Cholecalciferol (VITAMIN D3) 125 MCG (5000 UT) CAPS    Sig: Take 1 capsule (5,000 Units total) by mouth daily. For 8 weeks, then start Vitamin D3 2,000 units daily (OTC)    Dispense:  60 capsule    Refill:  0     Follow up plan: Return for 6 month fasting lab > 1 week later Follow up Cholesterol, PreDM, results.  Saralyn Pilar, DO [facility] 12/08/2023, 1:46 PM

## 2023-12-08 NOTE — Patient Instructions (Addendum)
Thank you for coming to the office today.  Low Vitamin D  Mild low Vit D 25 Start OTC Vitamin D3 5,000 iu daily for 12 weeks then reduce to OTC Vitamin D3 2,000 iu daily for maintenance  Please follow up with insurance on the network for X-rays and fees etc.   DUE for FASTING BLOOD WORK (no food or drink after midnight before the lab appointment, only water or coffee without cream/sugar on the morning of)  SCHEDULE "Lab Only" visit in the morning at the clinic for lab draw in 6 MONTHS   - Make sure Lab Only appointment is at about 1 week before your next appointment, so that results will be available  For Lab Results, once available within 2-3 days of blood draw, you can can log in to MyChart online to view your results and a brief explanation. Also, we can discuss results at next follow-up visit.   Please schedule a Follow-up Appointment to: Return for 6 month fasting lab > 1 week later Follow up Cholesterol, PreDM, results.  If you have any other questions or concerns, please feel free to call the office or send a message through MyChart. You may also schedule an earlier appointment if necessary.  Additionally, you may be receiving a survey about your experience at our office within a few days to 1 week by e-mail or mail. We value your feedback.  Saralyn Pilar, DO [facility], [place]

## 2024-05-12 ENCOUNTER — Other Ambulatory Visit: Payer: Self-pay | Admitting: Family Medicine

## 2024-05-12 DIAGNOSIS — M797 Fibromyalgia: Secondary | ICD-10-CM

## 2024-05-12 DIAGNOSIS — J9801 Acute bronchospasm: Secondary | ICD-10-CM

## 2024-05-14 NOTE — Telephone Encounter (Signed)
 Requested Prescriptions  Pending Prescriptions Disp Refills   albuterol  (VENTOLIN  HFA) 108 (90 Base) MCG/ACT inhaler [Pharmacy Med Name: ALBUTEROL  HFA (PROAIR ) INHALER] 8.5 each 1    Sig: INHALE 1-2 PUFFS BY MOUTH EVERY 6 HOURS AS NEEDED FOR WHEEZE OR SHORTNESS OF BREATH     Pulmonology:  Beta Agonists 2 Failed - 05/14/2024 12:32 PM      Failed - Valid encounter within last 12 months    Recent Outpatient Visits   None     Future Appointments             In 1 month Karamalegos, Kayleen Party, DO [facility]            Passed - Last BP in normal range    BP Readings from Last 1 Encounters:  12/08/23 110/76         Passed - Last Heart Rate in normal range    Pulse Readings from Last 1 Encounters:  12/08/23 81          DULoxetine  (CYMBALTA ) 60 MG capsule [Pharmacy Med Name: DULOXETINE  HCL DR 60 MG CAP] 90 capsule 0    Sig: TAKE 1 CAPSULE BY MOUTH EVERY DAY     Psychiatry: Antidepressants - SNRI - duloxetine  Failed - 05/14/2024 12:32 PM      Failed - Valid encounter within last 6 months    Recent Outpatient Visits   None     Future Appointments             In 1 month Karamalegos, Kayleen Party, DO [facility]            Passed - Cr in normal range and within 360 days    Creat  Date Value Ref Range Status  12/01/2023 0.89 0.50 - 1.05 mg/dL Final         Passed - eGFR is 30 or above and within 360 days    GFR, Est African American  Date Value Ref Range Status  12/22/2020 85 > OR = 60 mL/min/1.47m2 Final   GFR, Est Non African American  Date Value Ref Range Status  12/22/2020 73 > OR = 60 mL/min/1.55m2 Final   GFR, Estimated  Date Value Ref Range Status  05/06/2022 >60 >60 mL/min Final    Comment:    (NOTE) Calculated using the CKD-EPI Creatinine Equation (2021)    eGFR  Date Value Ref Range Status  12/01/2023 72 > OR = 60 mL/min/1.64m2 Final         Passed - Completed PHQ-2 or PHQ-9 in  the last 360 days      Passed - Last BP in normal range    BP Readings from Last 1 Encounters:  12/08/23 110/76

## 2024-06-08 ENCOUNTER — Other Ambulatory Visit: Payer: Self-pay

## 2024-06-08 DIAGNOSIS — E7800 Pure hypercholesterolemia, unspecified: Secondary | ICD-10-CM

## 2024-06-08 DIAGNOSIS — R7309 Other abnormal glucose: Secondary | ICD-10-CM

## 2024-06-08 DIAGNOSIS — E559 Vitamin D deficiency, unspecified: Secondary | ICD-10-CM

## 2024-06-09 LAB — VITAMIN D 25 HYDROXY (VIT D DEFICIENCY, FRACTURES): Vit D, 25-Hydroxy: 46 ng/mL (ref 30–100)

## 2024-06-09 LAB — LIPID PANEL
Cholesterol: 287 mg/dL — ABNORMAL HIGH (ref <200)
HDL: 57 mg/dL (ref >=50)
LDL Cholesterol (Calc): 173 mg/dL — ABNORMAL HIGH
Non-HDL Cholesterol (Calc): 230 mg/dL — ABNORMAL HIGH (ref <130)
Total CHOL/HDL Ratio: 5 (calc) — ABNORMAL HIGH (ref <5.0)
Triglycerides: 336 mg/dL — ABNORMAL HIGH (ref <150)

## 2024-06-09 LAB — HEMOGLOBIN A1C
Hgb A1c MFr Bld: 5.9 % — ABNORMAL HIGH (ref <5.7)
Mean Plasma Glucose: 123 mg/dL
eAG (mmol/L): 6.8 mmol/L

## 2024-06-10 ENCOUNTER — Ambulatory Visit: Payer: Self-pay | Admitting: Family Medicine

## 2024-06-15 ENCOUNTER — Ambulatory Visit: Payer: Self-pay | Admitting: Family Medicine

## 2024-06-15 ENCOUNTER — Encounter: Payer: Self-pay | Admitting: Family Medicine

## 2024-06-15 VITALS — BP 124/76 | HR 81 | Ht 65.0 in | Wt 193.4 lb

## 2024-06-15 DIAGNOSIS — R7303 Prediabetes: Secondary | ICD-10-CM

## 2024-06-15 DIAGNOSIS — E7800 Pure hypercholesterolemia, unspecified: Secondary | ICD-10-CM

## 2024-06-15 DIAGNOSIS — M797 Fibromyalgia: Secondary | ICD-10-CM

## 2024-06-15 DIAGNOSIS — I10 Essential (primary) hypertension: Secondary | ICD-10-CM

## 2024-06-15 MED ORDER — PRAVASTATIN SODIUM 10 MG PO TABS
10.0000 mg | ORAL_TABLET | ORAL | 3 refills | Status: AC
Start: 2024-06-15 — End: ?

## 2024-06-15 NOTE — Progress Notes (Unsigned)
 Subjective:    Patient ID: Denise Elliott, female    DOB: [birth year], 63 y.o.   MRN: [id number]  Denise Elliott is a 63 y.o. female presenting on 06/15/2024 for No chief complaint on file.   HPI  Fibromyalgia Chronic problem On medication Still experiences painful episodic flares and daily pain.   Vitamin D  Insufficiency Last lab 25 She will take Vitamin D3   CHRONIC HTN: Reports controlled BP Current Meds - hydrochlorothiazide  12.5mg  daily, Lisinopril  10mg  daily   Reports good compliance, took meds today. Tolerating well, w/o complaints. Denies CP, dyspnea, HA, edema, dizziness / lightheadedness    Headaches, Episodic Migraine Still on Duloxetine  60mg  and Nortriptyline  50mg  on med rec today Requesting all refills for future. Duloxetine  does not seem to help her arthritis pain significantly Taking Advil Ibuprofen 200mg s taking 2-3 pills twice a day. On Triptan PRN some relief, less headaches now    GAD with Panic Attacks   HYPERLIPIDEMIA: - Reports concerns. Last lipid panel 11/2023, elevated LDL 170s Prior history on statin but intolerance to myalgia Improving diet goal at this time and improve exercise.   ***LDL 172 up to 173. She will improve diet Prioro failed Rosuvastatin and Atorvastatin   Health Maintenance: ***     06/15/2024    1:12 PM 12/08/2023    1:18 PM 09/08/2023    1:21 PM  Depression screen PHQ 2/9  Decreased Interest 0 0 0  Down, Depressed, Hopeless 0 0 0  PHQ - 2 Score 0 0 0  Altered sleeping 1 0 0  Tired, decreased energy 1 2 1   Change in appetite 0 1 0  Feeling bad or failure about yourself  0 0 0  Trouble concentrating 0 1 0  Moving slowly or fidgety/restless 0 0 0  Suicidal thoughts 0 0 0  PHQ-9 Score 2 4 1   Difficult doing work/chores Not difficult at all  Not difficult at all       06/15/2024    1:12 PM 12/08/2023    1:18 PM 09/08/2023    1:21 PM 07/02/2023   11:30 AM  GAD 7 : Generalized Anxiety Score  Nervous, Anxious, on  Edge 0 1 0 1  Control/stop worrying 0 0 0 0  Worry too much - different things 0 0 0 1  Trouble relaxing 0 1 0 0  Restless 0 0 0 0  Easily annoyed or irritable 1 0 0 0  Afraid - awful might happen 0 0 0 0  Total GAD 7 Score 1 2 0 2  Anxiety Difficulty Not difficult at all  Not difficult at all     Social History   Tobacco Use   Smoking status: Never   Smokeless tobacco: Never  Vaping Use   Vaping status: Never Used  Substance Use Topics   Alcohol use: Yes    Comment: socially   Drug use: No    Review of Systems Per HPI unless specifically indicated above     Objective:    BP 124/76 (BP Location: Left Arm, Patient Position: Sitting, Cuff Size: Normal)   Pulse 81   Ht 5' 5 (1.651 m)   Wt 193 lb 6 oz (87.7 kg)   LMP 02/03/2020   SpO2 95%   BMI 32.18 kg/m   Wt Readings from Last 3 Encounters:  06/15/24 193 lb 6 oz (87.7 kg)  12/08/23 188 lb (85.3 kg)  09/08/23 194 lb (88 kg)    Physical Exam  Results for orders placed  or performed in visit on 06/08/24  VITAMIN D  25 Hydroxy (Vit-D Deficiency, Fractures)   Collection Time: 06/08/24  7:58 AM  Result Value Ref Range   Vit D, 25-Hydroxy 46 30 - 100 ng/mL  Hemoglobin A1c   Collection Time: 06/08/24  7:58 AM  Result Value Ref Range   Hgb A1c MFr Bld 5.9 (H) <5.7 %   Mean Plasma Glucose 123 mg/dL   eAG (mmol/L) 6.8 mmol/L  Lipid panel   Collection Time: 06/08/24  7:58 AM  Result Value Ref Range   Cholesterol 287 (H) <200 mg/dL   HDL 57 > OR = 50 mg/dL   Triglycerides 663 (H) <150 mg/dL   LDL Cholesterol (Calc) 173 (H) mg/dL (calc)   Total CHOL/HDL Ratio 5.0 (H) <5.0 (calc)   Non-HDL Cholesterol (Calc) 230 (H) <130 mg/dL (calc)      Assessment & Plan:   Problem List Items Addressed This Visit   None    ***  No orders of the defined types were placed in this encounter.   No orders of the defined types were placed in this encounter.   Follow up plan: No follow-ups on file.  ***Future labs ordered  for ***  A total of *** (lvl 3, 4, 5) 15, 25, 40 minutes was spent face-to-face with this patient. Greater than 50% (approximately *** minutes) of this time was spent in counseling and coordination of care with the patient. ***  Marsa Officer, DO [facility] 06/15/2024, 1:24 PM

## 2024-06-15 NOTE — Patient Instructions (Addendum)
 Thank you for coming to the office today.  Elevated LDL still 170s  Start NEW statin - Pravastatin 10mg  (low dose) every other evening. If you develop aches pain side effect stop it and let me know.  We can consider Repatha  injection in future.  Recent Labs    12/01/23 0816 06/08/24 0758  HGBA1C 5.7* 5.9*   Goal to keep improving lifestyle, low carb starch sugar. No medication required  Vitamin D  back in range reduce to OTC Vitamin D3 2,000 iu daily for maintenance  DUE for FASTING BLOOD WORK (no food or drink after midnight before the lab appointment, only water  or coffee without cream/sugar on the morning of)  SCHEDULE Lab Only visit in the morning at the clinic for lab draw in 6 MONTHS   - Make sure Lab Only appointment is at about 1 week before your next appointment, so that results will be available  For Lab Results, once available within 2-3 days of blood draw, you can can log in to MyChart online to view your results and a brief explanation. Also, we can discuss results at next follow-up visit.   Please schedule a Follow-up Appointment to: Return in about 6 months (around 12/16/2024) for 6 month fasting lab > 1 week later Annual Physical.  If you have any other questions or concerns, please feel free to call the office or send a message through MyChart. You may also schedule an earlier appointment if necessary.  Additionally, you may be receiving a survey about your experience at our office within a few days to 1 week by e-mail or mail. We value your feedback.  Marsa Officer, DO [facility], [place]

## 2024-06-16 ENCOUNTER — Other Ambulatory Visit: Payer: Self-pay | Admitting: Family Medicine

## 2024-06-16 DIAGNOSIS — E7800 Pure hypercholesterolemia, unspecified: Secondary | ICD-10-CM

## 2024-06-16 DIAGNOSIS — R7309 Other abnormal glucose: Secondary | ICD-10-CM

## 2024-06-16 DIAGNOSIS — E559 Vitamin D deficiency, unspecified: Secondary | ICD-10-CM

## 2024-06-16 DIAGNOSIS — R7303 Prediabetes: Secondary | ICD-10-CM

## 2024-06-16 DIAGNOSIS — Z0000 Encounter for general adult medical examination without abnormal findings: Secondary | ICD-10-CM

## 2024-06-16 DIAGNOSIS — I10 Essential (primary) hypertension: Secondary | ICD-10-CM

## 2024-06-16 DIAGNOSIS — M797 Fibromyalgia: Secondary | ICD-10-CM

## 2024-08-06 ENCOUNTER — Encounter: Payer: Self-pay | Admitting: Family Medicine

## 2024-08-06 ENCOUNTER — Ambulatory Visit (INDEPENDENT_AMBULATORY_CARE_PROVIDER_SITE_OTHER): Admitting: Family Medicine

## 2024-08-06 VITALS — BP 106/70 | HR 103 | Ht 65.0 in | Wt 196.1 lb

## 2024-08-06 DIAGNOSIS — M797 Fibromyalgia: Secondary | ICD-10-CM | POA: Diagnosis not present

## 2024-08-06 DIAGNOSIS — F331 Major depressive disorder, recurrent, moderate: Secondary | ICD-10-CM | POA: Diagnosis not present

## 2024-08-06 DIAGNOSIS — F419 Anxiety disorder, unspecified: Secondary | ICD-10-CM

## 2024-08-06 MED ORDER — BUSPIRONE HCL 5 MG PO TABS
5.0000 mg | ORAL_TABLET | Freq: Three times a day (TID) | ORAL | 1 refills | Status: DC | PRN
Start: 2024-08-06 — End: 2024-08-31

## 2024-08-06 NOTE — Patient Instructions (Addendum)
 Thank you for coming to the office today.  For general pain  Recommend to start taking Tylenol Extra Strength 500mg  tabs - take 1 to 2 tabs per dose (max 1000mg ) every 6-8 hours for pain (take regularly, don't skip a dose for next 7 days), max 24 hour daily dose is 6 tablets or 3000mg . In the future you can repeat the same everyday Tylenol course for 1-2 weeks at a time.   -----------------------------------  START anti inflammatory topical - OTC Voltaren (generic Diclofenac) topical 2-4 times a day as needed for pain swelling of affected joint for 1-2 weeks or longer.  ----------------------  Anxiety  Start Buspar  5mg  THREE TIMES A DAY as needed for anxiety  Check this referral list and contact one if you want to explore insurance coverage and services. I am happy to help and or refer if you need  These offices have both PSYCHIATRY doctors and THERAPISTS  [facility] Scientist, water quality Available) [facility] [address] [place], [place] [address] Phone: [phone]  [facility] Address: [address], [place], [place] [address] [email or web address] Phone:  [phone]  [facility] [address]) Address: [address], [place], [place] [address] Hours: 8:30AM-5PM Phone: [phone]  [facility] (Adult, Peds, Geriatric, Counseling) [address] [place], [place] [address] Phone: [phone] Fax: [phone]  [facility] [address] [place], [place] [address] Phone: [phone]  [facility] (All ages) [address], [place], [id number] Phone: [phone] (Option 1) [email or web address]  ----------------------------------------------------------------- THERAPIST ONLY  (No Psychiatry)  [facility] [address] [place],  [place] [address] [place]   [phone]  [facility]   Address: [address] [place], [place] [address] Hours: Open today  9AM-7PM Phone: [phone]  [facility] [address], [facility]  - [facility] Address: [address] [place], [place] [address] Phone: [phone]  [facility] [place], [place] [address] Phone: [phone]     Please schedule a Follow-up Appointment to: No follow-ups on file.  If you have any other questions or concerns, please feel free to call the office or send a message through MyChart. You may also schedule an earlier appointment if necessary.  Additionally, you may be receiving a survey about your experience at our office within a few days to 1 week by e-mail or mail. We value your feedback.  Marsa Officer, DO [facility], [place]
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
## 2024-08-14 ENCOUNTER — Other Ambulatory Visit: Payer: Self-pay | Admitting: Family Medicine

## 2024-08-14 DIAGNOSIS — M797 Fibromyalgia: Secondary | ICD-10-CM

## 2024-08-17 NOTE — Telephone Encounter (Signed)
 Requested Prescriptions  Pending Prescriptions Disp Refills   DULoxetine  (CYMBALTA ) 60 MG capsule [Pharmacy Med Name: DULOXETINE  HCL DR 60 MG CAP] 90 capsule 1    Sig: TAKE 1 CAPSULE BY MOUTH EVERY DAY     Psychiatry: Antidepressants - SNRI - duloxetine  Passed - 08/17/2024  8:42 AM      Passed - Cr in normal range and within 360 days    Creat  Date Value Ref Range Status  12/01/2023 0.89 0.50 - 1.05 mg/dL Final         Passed - eGFR is 30 or above and within 360 days    GFR, Est African American  Date Value Ref Range Status  12/22/2020 85 > OR = 60 mL/min/1.34m2 Final   GFR, Est Non African American  Date Value Ref Range Status  12/22/2020 73 > OR = 60 mL/min/1.29m2 Final   GFR, Estimated  Date Value Ref Range Status  05/06/2022 >60 >60 mL/min Final    Comment:    (NOTE) Calculated using the CKD-EPI Creatinine Equation (2021)    eGFR  Date Value Ref Range Status  12/01/2023 72 > OR = 60 mL/min/1.23m2 Final         Passed - Completed PHQ-2 or PHQ-9 in the last 360 days      Passed - Last BP in normal range    BP Readings from Last 1 Encounters:  08/06/24 106/70         Passed - Valid encounter within last 6 months    Recent Outpatient Visits           1 week ago Anxiety   [facility] [place], Marsa PARAS, DO   2 months ago Pure hypercholesterolemia   [place] [facility] [place], Marsa PARAS, [place]

## 2024-08-29 ENCOUNTER — Other Ambulatory Visit: Payer: Self-pay | Admitting: Family Medicine

## 2024-08-29 DIAGNOSIS — F419 Anxiety disorder, unspecified: Secondary | ICD-10-CM

## 2024-08-31 NOTE — Telephone Encounter (Signed)
 Requested Prescriptions  Pending Prescriptions Disp Refills   busPIRone  (BUSPAR ) 5 MG tablet [Pharmacy Med Name: BUSPIRONE  HCL 5 MG TABLET] 270 tablet 1    Sig: TAKE 1 TABLET (5 MG TOTAL) BY MOUTH 3 (THREE) TIMES DAILY AS NEEDED (ANXIETY).     Psychiatry: Anxiolytics/Hypnotics - Non-controlled Passed - 08/31/2024 11:27 AM      Passed - Valid encounter within last 12 months    Recent Outpatient Visits           3 weeks ago Anxiety   [place] [facility] [place], Marsa PARAS, DO   2 months ago Pure hypercholesterolemia   [place] [facility] [place], Marsa PARAS, [place]

## 2024-11-04 ENCOUNTER — Ambulatory Visit: Payer: Self-pay

## 2024-11-04 ENCOUNTER — Ambulatory Visit (INDEPENDENT_AMBULATORY_CARE_PROVIDER_SITE_OTHER)

## 2024-11-04 VITALS — BP 122/84 | HR 83 | Ht 65.0 in | Wt 198.8 lb

## 2024-11-04 DIAGNOSIS — J0110 Acute frontal sinusitis, unspecified: Secondary | ICD-10-CM | POA: Diagnosis not present

## 2024-11-04 MED ORDER — AMOXICILLIN-POT CLAVULANATE 875-125 MG PO TABS
1.0000 | ORAL_TABLET | Freq: Two times a day (BID) | ORAL | 0 refills | Status: AC
Start: 2024-11-04 — End: 2024-11-11

## 2024-11-04 NOTE — Telephone Encounter (Signed)
 FYI Only or Action Required?: FYI only for provider: appointment scheduled on 11/04/24.  Patient was last seen in primary care on 08/06/2024 by Edman Marsa PARAS, DO.  Called Nurse Triage reporting Otalgia.  Symptoms began several weeks ago.  Interventions attempted: OTC medications: Benadryl, nasal spray and Rest, hydration, or home remedies.  Symptoms are: gradually worsening.  Triage Disposition: See Physician Within 24 Hours  Patient/caregiver understands and will follow disposition?: Yes   Copied from CRM #[id number]. Topic: Clinical - Red Word Triage >> Nov 04, 2024 11:26 AM Willma SAUNDERS wrote: [facility] that prompted transfer to Nurse Triage: Patient has been having pain in both ears for the past couple years. States the pain over the last two weeks has gotten worse and there is fluid in her ears.  Says she is allergic to her dog but uses nasal spray. Reason for Disposition  Earache  (Exceptions: Brief ear pain of lasting less than 60 minutes, or earache occurring during air travel.)  Answer Assessment - Initial Assessment Questions 1. LOCATION: Which ear is involved?     bilateral 2. ONSET: When did the ear pain start?      Two years but worsening for two weeks  3. SEVERITY: How bad is the pain?  (Scale 1-10; mild, moderate or severe)     Worsening moderate left worse than right  4. URI SYMPTOMS: Do you have a runny nose or cough?     denies 5. FEVER: Do you have a fever? If Yes, ask: What is your temperature, how was it measured, and when did it start?     Denies  6. CAUSE: Have you been swimming recently?, How often do you use Q-TIPS?, Have you had any recent air travel or scuba diving?     Denies  7. OTHER SYMPTOMS: Do you have any other symptoms? (e.g., decreased hearing, dizziness, headache, stiff neck, vomiting)     Feels fluid in ears, denies drainage 8. PREGNANCY: Is there any chance you are pregnant? When was your last menstrual  period?  Protocols used: [place]

## 2024-11-04 NOTE — Progress Notes (Signed)
 Acute Patient Visit  Physician: Ariell Gunnels A Nasiyah Laverdiere, MD  Patient: Denise Elliott MRN: [id number] DOB: 1961-11-02 PCP: Edman Marsa PARAS, DO     Subjective:   Chief Complaint  Patient presents with   Ear Pain    Patient states she is have right ear pain along with sinus pain. She states its been going on for several months.     HPI: The patient is a 63 y.o. female who presents today for:   Discussed the use of AI scribe software for clinical note transcription with the patient, who gave verbal consent to proceed.  History of Present Illness   Denise Elliott is a 63 year old female with allergies and asthma who presents with worsening ear and sinus pain.  Otaligia and sinus pressure - Bilateral ear pain, left greater than right x 2 weeks - Worsening severity of ear pain - Associated with pressure in both ears and sinuses - Sinus congestion present - Bloody nasal drainage, particularly in the morning - No fever or chills  Allergic rhinitis and asthma exacerbation - History of allergies and asthma, worsened after COVID-19 infection - Typically experiences sinus infections once a year during winter - Will berehoming of dog due to allergies  Symptom management and response to treatment - Using saline rinses, nasal spray, and allergy pills without significant relief      ROS:   As noted in the HPI    ASSESMENT/PLAN:  Encounter Diagnoses  Name Primary?   Acute non-recurrent frontal sinusitis Yes    No orders of the defined types were placed in this encounter.   Assessment and Plan    Acute sinusitis with bilateral otalgia and allergic rhinitis  Allergic rhinitis and asthma exacerbated by environmental factors. Possible secondary bacterial infection due to persistent symptoms and bloody drainage. - Prescribed Augmentin  x 7 days - Advised ibuprofen and Aleve for inflammation. - Instructed to continue saline rinses and nasal spray after flushing.            OBJECTIVE: Vitals:   11/04/24 1511  BP: 122/84  Pulse: 83  SpO2: 93%  Weight: 198 lb 12.8 oz (90.2 kg)  Height: 5' 5 (1.651 m)    Body mass index is 33.08 kg/m.   Physical Exam Vitals reviewed.  Constitutional:      Appearance: Normal appearance. Well-developed with normal weight.  HEENT: Normal TM and ear canal bilaterally Neurological:     General: No focal deficit present.  Psychiatric:        Mood and Affect: Mood, behavior and cognition normal      Allergies Patient is allergic to versed [midazolam], grapeseed extract [nutritional supplements], nutritional supplements, and proanthocyanidin.  Past Medical History Patient  has a past medical history of Allergy, Anxiety, Arthritis, Cervical dystonia, Complication of anesthesia, Depression, Fibromyalgia, Fibromyalgia, GERD (gastroesophageal reflux disease), Glaucoma, Goiter, Headache, History of kidney stones, HLD (hyperlipidemia), Hypertension, Laterocollis, Plantar fasciitis, Sleep apnea, Tinnitus, Varicose veins of both legs with edema, and Vertigo.  Surgical History Patient  has a past surgical history that includes Tonsillectomy; Gallbladder surgery; Tonsillectomy; Cholecystectomy; Cosmetic surgery (Left); Colonoscopy with propofol  (N/A, 05/26/2020); and polypectomy (05/26/2020).  Family History Pateint's family history includes AAA (abdominal aortic aneurysm) in her father; Alcohol abuse in her father and paternal grandfather; Anuerysm in her father and paternal grandfather; Breast cancer (age of onset: 77) in her sister; Colon cancer in her maternal uncle; Colon polyps in her mother; Goiter in her mother; Healthy in her daughter, sister,  sister, sister, son, and son; Hypertension in her mother; Liver cancer in her paternal grandfather; Vision loss in her maternal grandmother.  Social History Patient  reports that she has never smoked. She has never used smokeless tobacco. She reports current alcohol use. She  reports that she does not use drugs.    11/04/2024

## 2024-11-16 ENCOUNTER — Encounter: Payer: Self-pay | Admitting: Family Medicine

## 2024-11-30 ENCOUNTER — Encounter: Payer: Self-pay | Admitting: Family Medicine

## 2024-11-30 ENCOUNTER — Ambulatory Visit: Admitting: Family Medicine

## 2024-11-30 VITALS — BP 130/78 | HR 79 | Ht 65.0 in | Wt 200.5 lb

## 2024-11-30 DIAGNOSIS — M654 Radial styloid tenosynovitis [de Quervain]: Secondary | ICD-10-CM

## 2024-11-30 DIAGNOSIS — M792 Neuralgia and neuritis, unspecified: Secondary | ICD-10-CM

## 2024-11-30 DIAGNOSIS — M79644 Pain in right finger(s): Secondary | ICD-10-CM

## 2024-11-30 DIAGNOSIS — N951 Menopausal and female climacteric states: Secondary | ICD-10-CM

## 2024-11-30 NOTE — Progress Notes (Signed)
 Subjective:    Patient ID: Denise Elliott, female    DOB: March 18, 1961, 63 y.o.   MRN: [id number]  Denise Elliott is a 63 y.o. female presenting on 11/30/2024 for Wrist Pain (Right )   HPI  Discussed the use of AI scribe software for clinical note transcription with the patient, who gave verbal consent to proceed.  History of Present Illness   Denise Elliott is a 63 year old female who presents with right thumb and wrist pain, night sweats, and hot flashes.  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, hands, and neck - Pain interferes with daily activities, including putting up Christmas decorations - No prior injections for pain relief - Previously told she might have carpal tunnel syndrome, but no nerve study performed - Current pain management includes Tylenol and duloxetine   Vasomotor symptoms (night sweats and hot flashes) - Night sweats and hot flashes have recurred after a period of remission - Wakes up every morning with wet pajamas and a wet pillow - Symptoms negatively impact mood - Initial onset of symptoms 15 years ago, lasting for five years - Previously treated with hormone replacement therapy, discontinued due to age-related concerns  Gynecologic history Previously followed by Maryl SHIPPER, not for this issue - Uterine scraping performed a couple of years ago for bleeding, pathology was non-cancerous            11/04/2024    3:24 PM 08/06/2024   10:04 AM 06/15/2024    1:12 PM  Depression screen PHQ 2/9  Decreased Interest 0 1 0  Down, Depressed, Hopeless 0 1 0  PHQ - 2 Score 0 2 0  Altered sleeping 0 1 1  Tired, decreased energy 1 1 1   Change in appetite 0 1 0  Feeling bad or failure about yourself  0 0 0  Trouble concentrating 1 1 0  Moving slowly or  fidgety/restless 0 0 0  Suicidal thoughts 0 0 0  PHQ-9 Score 2 6  2    Difficult doing work/chores Somewhat difficult  Not difficult at all     Data saved with a previous flowsheet row definition       11/04/2024    3:25 PM 08/06/2024   10:04 AM 06/15/2024    1:12 PM 12/08/2023    1:18 PM  GAD 7 : Generalized Anxiety Score  Nervous, Anxious, on Edge 0 1 0 1  Control/stop worrying 0 0 0 0  Worry too much - different things 0 1 0 0  Trouble relaxing 1 1 0 1  Restless 0 0 0 0  Easily annoyed or irritable 0 1 1 0  Afraid - awful might happen 0 1 0 0  Total GAD 7 Score 1 5 1 2   Anxiety Difficulty Somewhat difficult  Not difficult at all     Past Surgical History:  Procedure Laterality Date   CHOLECYSTECTOMY     COLONOSCOPY WITH PROPOFOL  N/A 05/26/2020   Procedure: COLONOSCOPY WITH PROPOFOL ;  Surgeon: Jinny Carmine, MD;  Location: [facility];  Service: Endoscopy;  Laterality: N/A;  priority 4   COSMETIC SURGERY Left    broken cheek bone   GALLBLADDER SURGERY     POLYPECTOMY  05/26/2020   Procedure: POLYPECTOMY;  Surgeon: Jinny Carmine, MD;  Location: [facility];  Service: Endoscopy;;   TONSILLECTOMY     TONSILLECTOMY       Social History[1]  Review of Systems Per HPI unless specifically indicated above     Objective:    BP 130/78 (BP Location: Left Arm, Patient Position: Sitting, Cuff Size: Normal)   Pulse 79   Ht 5' 5 (1.651 m)   Wt 200 lb 8 oz (90.9 kg)   LMP 02/03/2020   SpO2 95%   BMI 33.36 kg/m   Wt Readings from Last 3 Encounters:  11/30/24 200 lb 8 oz (90.9 kg)  11/04/24 198 lb 12.8 oz (90.2 kg)  08/06/24 196 lb 2 oz (89 kg)    Physical Exam Vitals and nursing note reviewed.  Constitutional:      General: She is not in acute distress.    Appearance: Normal appearance. She is well-developed. She is not diaphoretic.     Comments: Well-appearing, comfortable, cooperative  HENT:     Head: Normocephalic and atraumatic.  Eyes:     General:         Right eye: No discharge.        Left eye: No discharge.     Conjunctiva/sclera: Conjunctivae normal.  Cardiovascular:     Rate and Rhythm: Normal rate.  Pulmonary:     Effort: Pulmonary effort is normal.  Musculoskeletal:     Comments: Right Hand/Wrist Inspection: Normal appearance, symmetrical, no bulky MCP joints, no edema or erythema. Palpation: Non tender hand / wrist, carpal bones, including MCP, base of thumb. No distinct anatomical snuff box or scaphoid tenderness. Mild tenderness over APL / EPB tendons radially. ROM: full active wrist ROM flex / ext, ulnar / radial deviation, some mild pain with radial deviation Strength: 5/5 grip, thumb opposition, wrist flex/ext Neurovascular: distally intact   Skin:    General: Skin is warm and dry.     Findings: No erythema or rash.  Neurological:     Mental Status: She is alert and oriented to person, place, and time.  Psychiatric:        Mood and Affect: Mood normal.        Behavior: Behavior normal.        Thought Content: Thought content normal.     Comments: Well groomed, good eye contact, normal speech and thoughts     I have personally reviewed the radiology report from 09/06/21 on Right Hand Wrist X-ray.  CLINICAL DATA:  Multiple falls, pain   EXAM: RIGHT HAND - COMPLETE 3+ VIEW   COMPARISON:  None.   FINDINGS: There is no evidence of fracture or dislocation. There is no evidence of arthropathy or other focal bone abnormality. Soft tissues are unremarkable.   IMPRESSION: No fracture or dislocation of the right hand.     Electronically Signed   By: Marolyn Jaksch M.D.   On: 09/07/2021 14:15  CLINICAL DATA:  Clemens 2 times in the last 5 months, continued generalized pain through rt wrist, hand and thumb specifically, moving thumb is more painful an seems more limited, no prev. Surgery, shielded NOTE: Patient attempted to remover her r.*comment was truncated*fall injury x 2 in past 5 months FOOSH Right wrist  pain and hand pain   EXAM: RIGHT WRIST - COMPLETE 3+ VIEW   COMPARISON:  None.   FINDINGS: No distal radius or ulnar fracture. Radiocarpal joint is intact. No carpal fracture. No soft tissue abnormality.   IMPRESSION: No fracture or dislocation.     Electronically Signed   By: Jackquline Boxer  M.D.   On: 09/07/2021 09:35  Results for orders placed or performed in visit on 06/08/24  VITAMIN D  25 Hydroxy (Vit-D Deficiency, Fractures)   Collection Time: 06/08/24  7:58 AM  Result Value Ref Range   Vit D, 25-Hydroxy 46 30 - 100 ng/mL  Hemoglobin A1c   Collection Time: 06/08/24  7:58 AM  Result Value Ref Range   Hgb A1c MFr Bld 5.9 (H) <5.7 %   Mean Plasma Glucose 123 mg/dL   eAG (mmol/L) 6.8 mmol/L  Lipid panel   Collection Time: 06/08/24  7:58 AM  Result Value Ref Range   Cholesterol 287 (H) <200 mg/dL   HDL 57 > OR = 50 mg/dL   Triglycerides 663 (H) <150 mg/dL   LDL Cholesterol (Calc) 173 (H) mg/dL (calc)   Total CHOL/HDL Ratio 5.0 (H) <5.0 (calc)   Non-HDL Cholesterol (Calc) 230 (H) <130 mg/dL (calc)      Assessment & Plan:   Problem List Items Addressed This Visit   None Visit Diagnoses       De Quervain's tenosynovitis, right    -  Primary   Relevant Orders   Ambulatory referral to Orthopedic Surgery     Pain of right thumb       Relevant Orders   Ambulatory referral to Orthopedic Surgery     Neuropathic pain of right hand       Relevant Orders   Ambulatory referral to Orthopedic Surgery     Hot flashes due to menopause       Relevant Orders   Ambulatory referral to Obstetrics / Gynecology        Everitt Quervain's tenosynovitis, right hand Chronic pain and burning in right thumb and wrist due to suspected De Quervain's tenosynovitis. Differential includes carpal tunnel syndrome and arthritis. Likely inflammation and tendon/nerves pinching.  X-ray 2022 without significant arthritis identified  - Referred to hand orthopedic specialist, Dr. Francisco, for  further evaluation and management. - Advised use of thumb splints and Voltaren for symptomatic relief. - Recommended ice packs for inflammation. - Discussed potential need for further imaging or nerve testing if indicated by specialist.  Menopausal symptoms with hot flashes Recurrent hot flashes and night sweats post-cessation of hormone therapy at age 64. Risks of hormone therapy include increased risk of heart attack and breast cancer, especially after age 14. Newer treatments available but may have higher costs and require eligibility assessment. - Referred to gynecologist, Dr. Verdon, for evaluation and discussion of hormone replacement therapy and newer treatment options. - Recommended herbal supplements like black cohosh or Estroven for symptomatic relief.      Orders Placed This Encounter  Procedures   Ambulatory referral to Orthopedic Surgery    Referral Priority:   Routine    Referral Type:   Surgical    Referral Reason:   Specialty Services Required    Requested Specialty:   Orthopedic Surgery    Number of Visits Requested:   1   Ambulatory referral to Obstetrics / Gynecology    Referral Priority:   Routine    Referral Type:   Consultation    Referral Reason:   Specialty Services Required    Requested Specialty:   Obstetrics and Gynecology    Number of Visits Requested:   1    No orders of the defined types were placed in this encounter.   Follow up plan: Return if symptoms worsen or fail to improve.   Marsa Officer, DO [facility]  Group 11/30/2024, 11:24 AM     [1]  Social History Tobacco Use   Smoking status: Never   Smokeless tobacco: Never  Vaping Use   Vaping status: Never Used  Substance Use Topics   Alcohol use: Yes    Comment: socially   Drug use: No

## 2024-11-30 NOTE — Patient Instructions (Addendum)
 Thank you for coming to the office today.  Try the Estroven herbal supplement for Hot Flashes.  Referral also sent to [facility] for Hot Flashes. They can discuss options with possible hormonal therapy or new rx therapy options.  Requested Dr Verdon  [facility] - Obstetrics and Gynecology [address] [place], [place] [address]  Office: [phone]  --------------------  You most likely have Right hand DeQuervains Tenosynovitis - This is a type of tendonitis involving the tendons from the thumb into the wrist, it is a very common spot for inflammation and pain, usually caused by repetitive activities (lifting, writing, typing, carrying, worse with heavier objective or more repetitive strain) - Once it is flared up it can continue to persist for days to weeks due to inflammation, and mostly importantly needs rest and time to heal  START anti inflammatory topical - OTC Voltaren (generic Diclofenac) topical 2-4 times a day as needed for pain swelling of affected joint for 1-2 weeks or longer.  It is safe to take Tylenol Ext Str 500mg  tabs - take 1 to 2 (max dose 1000mg ) every 6 hours as needed for breakthrough pain, max 24 hour daily dose is 6 to 8 tablets or 4000mg  - Wrist splint will provide support to the tendons and reduce strain - Purchase a THUMB SPICA SPLINT (to limit thumb and wrist flexion) - REST is extremely important, the goal is to AVOID re-injury, try to modify activities - Also may try ice packs if swelling, or topical icy hot muscle rub can also help ease worsening pain temporarily  Referral to Orthopedics for Hand specialist  [facility] Address: [address] [place], [place] [address] Phone: [phone] Fax: [phone]  Hardin Slocumb, MD (Hand, Wrist, Elbow specialist)  May need further imaging X-ray and or Nerve testing if she feels it could be related to carpal tunnel or other nerve cause.  Please schedule a Follow-up  Appointment to: Return if symptoms worsen or fail to improve.  If you have any other questions or concerns, please feel free to call the office or send a message through MyChart. You may also schedule an earlier appointment if necessary.  Additionally, you may be receiving a survey about your experience at our office within a few days to 1 week by e-mail or mail. We value your feedback.  Marsa Officer, DO [facility], [place]

## 2024-12-20 ENCOUNTER — Other Ambulatory Visit: Payer: Self-pay

## 2024-12-20 DIAGNOSIS — R7303 Prediabetes: Secondary | ICD-10-CM

## 2024-12-20 DIAGNOSIS — R7309 Other abnormal glucose: Secondary | ICD-10-CM

## 2024-12-20 DIAGNOSIS — E7800 Pure hypercholesterolemia, unspecified: Secondary | ICD-10-CM

## 2024-12-20 DIAGNOSIS — I10 Essential (primary) hypertension: Secondary | ICD-10-CM

## 2024-12-20 DIAGNOSIS — E559 Vitamin D deficiency, unspecified: Secondary | ICD-10-CM

## 2024-12-20 DIAGNOSIS — Z0000 Encounter for general adult medical examination without abnormal findings: Secondary | ICD-10-CM

## 2024-12-21 LAB — VITAMIN D 25 HYDROXY (VIT D DEFICIENCY, FRACTURES): Vit D, 25-Hydroxy: 33 ng/mL (ref 30–100)

## 2024-12-21 LAB — HEMOGLOBIN A1C
Hgb A1c MFr Bld: 5.9 % — ABNORMAL HIGH
Mean Plasma Glucose: 123 mg/dL
eAG (mmol/L): 6.8 mmol/L

## 2024-12-21 LAB — CBC WITH DIFFERENTIAL/PLATELET
Absolute Lymphocytes: 1964 {cells}/uL (ref 850–3900)
Absolute Monocytes: 393 {cells}/uL (ref 200–950)
Basophils Absolute: 69 {cells}/uL (ref 0–200)
Basophils Relative: 0.9 %
Eosinophils Absolute: 578 {cells}/uL — ABNORMAL HIGH (ref 15–500)
Eosinophils Relative: 7.5 %
HCT: 43.3 % (ref 35.9–46.0)
Hemoglobin: 14 g/dL (ref 11.7–15.5)
MCH: 29.1 pg (ref 27.0–33.0)
MCHC: 32.3 g/dL (ref 31.6–35.4)
MCV: 90 fL (ref 81.4–101.7)
MPV: 9.6 fL (ref 7.5–12.5)
Monocytes Relative: 5.1 %
Neutro Abs: 4697 {cells}/uL (ref 1500–7800)
Neutrophils Relative %: 61 %
Platelets: 282 Thousand/uL (ref 140–400)
RBC: 4.81 Million/uL (ref 3.80–5.10)
RDW: 12.5 % (ref 11.0–15.0)
Total Lymphocyte: 25.5 %
WBC: 7.7 Thousand/uL (ref 3.8–10.8)

## 2024-12-21 LAB — COMPREHENSIVE METABOLIC PANEL WITH GFR
AG Ratio: 2 (calc) (ref 1.0–2.5)
ALT: 27 U/L (ref 6–29)
AST: 16 U/L (ref 10–35)
Albumin: 4.3 g/dL (ref 3.6–5.1)
Alkaline phosphatase (APISO): 95 U/L (ref 37–153)
BUN: 17 mg/dL (ref 7–25)
CO2: 28 mmol/L (ref 20–32)
Calcium: 9.5 mg/dL (ref 8.6–10.4)
Chloride: 100 mmol/L (ref 98–110)
Creat: 0.88 mg/dL (ref 0.50–1.05)
Globulin: 2.2 g/dL (ref 1.9–3.7)
Glucose, Bld: 102 mg/dL — ABNORMAL HIGH (ref 65–99)
Potassium: 4.4 mmol/L (ref 3.5–5.3)
Sodium: 138 mmol/L (ref 135–146)
Total Bilirubin: 0.3 mg/dL (ref 0.2–1.2)
Total Protein: 6.5 g/dL (ref 6.1–8.1)
eGFR: 73 mL/min/1.73m2

## 2024-12-21 LAB — LIPID PANEL
Cholesterol: 273 mg/dL — ABNORMAL HIGH
HDL: 54 mg/dL
LDL Cholesterol (Calc): 175 mg/dL — ABNORMAL HIGH
Non-HDL Cholesterol (Calc): 219 mg/dL — ABNORMAL HIGH
Total CHOL/HDL Ratio: 5.1 (calc) — ABNORMAL HIGH
Triglycerides: 268 mg/dL — ABNORMAL HIGH

## 2024-12-21 LAB — TSH: TSH: 1.32 m[IU]/L (ref 0.40–4.50)

## 2024-12-28 ENCOUNTER — Encounter: Payer: Self-pay | Admitting: Family Medicine

## 2024-12-28 ENCOUNTER — Ambulatory Visit (INDEPENDENT_AMBULATORY_CARE_PROVIDER_SITE_OTHER): Payer: Self-pay | Admitting: Family Medicine

## 2024-12-28 VITALS — BP 130/68 | HR 91 | Ht 65.0 in | Wt 201.4 lb

## 2024-12-28 DIAGNOSIS — I10 Essential (primary) hypertension: Secondary | ICD-10-CM

## 2024-12-28 DIAGNOSIS — E7800 Pure hypercholesterolemia, unspecified: Secondary | ICD-10-CM

## 2024-12-28 DIAGNOSIS — K8681 Exocrine pancreatic insufficiency: Secondary | ICD-10-CM

## 2024-12-28 DIAGNOSIS — Z0000 Encounter for general adult medical examination without abnormal findings: Secondary | ICD-10-CM | POA: Diagnosis not present

## 2024-12-28 DIAGNOSIS — Z23 Encounter for immunization: Secondary | ICD-10-CM | POA: Diagnosis not present

## 2024-12-28 DIAGNOSIS — R7303 Prediabetes: Secondary | ICD-10-CM | POA: Diagnosis not present

## 2024-12-28 DIAGNOSIS — F419 Anxiety disorder, unspecified: Secondary | ICD-10-CM | POA: Diagnosis not present

## 2024-12-28 DIAGNOSIS — F331 Major depressive disorder, recurrent, moderate: Secondary | ICD-10-CM

## 2024-12-28 DIAGNOSIS — M797 Fibromyalgia: Secondary | ICD-10-CM

## 2024-12-28 MED ORDER — HYDROCHLOROTHIAZIDE 12.5 MG PO TABS: 12.5000 mg | ORAL_TABLET | Freq: Every day | ORAL | 3 refills | Status: AC

## 2024-12-28 MED ORDER — DULOXETINE HCL 60 MG PO CPEP: 60.0000 mg | ORAL_CAPSULE | Freq: Every day | ORAL | 3 refills | Status: AC

## 2024-12-28 MED ORDER — NORTRIPTYLINE HCL 50 MG PO CAPS: 50.0000 mg | ORAL_CAPSULE | Freq: Every day | ORAL | 3 refills | Status: AC

## 2024-12-28 MED ORDER — LISINOPRIL 10 MG PO TABS: 10.0000 mg | ORAL_TABLET | Freq: Every day | ORAL | 3 refills | Status: AC

## 2024-12-28 NOTE — Patient Instructions (Addendum)
 Thank you for coming to the office today.  Referral to Endocrinology for pancreas  [facility] Address: [address] [place], [place] [address] Phone: [phone]  Inner ear issue is difficult to treat, I don't have new suggestions at the moment. But ear drum looks mostly normal only mild effusion.  Refills sent.  Prevnar 20 pneumonia vaccine today  Recent Labs    06/08/24 0758 12/20/24 0829  HGBA1C 5.9* 5.9*   Okay to have balanced diet, does not have to be sugar free.  Please schedule a Follow-up Appointment to: Return in about 6 months (around 06/27/2025), or if symptoms worsen or fail to improve.  If you have any other questions or concerns, please feel free to call the office or send a message through MyChart. You may also schedule an earlier appointment if necessary.  Additionally, you may be receiving a survey about your experience at our office within a few days to 1 week by e-mail or mail. We value your feedback.  Marsa Officer, DO [facility], [place]

## 2024-12-28 NOTE — Progress Notes (Unsigned)
 "  Subjective:    Patient ID: Denise Elliott, female    DOB: [birth year], 64 y.o.   MRN: [id number]  Denise Elliott is a 64 y.o. female presenting on 12/28/2024 for Annual Exam   HPI  Discussed the use of AI scribe software for clinical note transcription with the patient, who gave verbal consent to proceed.  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 She reported no new health issues since the last visit. The patient mentioned a recent experience with COVID-19, which she felt had significantly impacted her energy levels and contributed to weight gain. She reported a previous daily walking routine of three miles, which had been disrupted due to the pandemic. The patient has recently acquired a treadmill and has resumed physical activity with the goal of losing fifty pounds before a planned trip to [place] in April.   Goal to lose 50 lbs by April 2025   Fibromyalgia Chronic problem On medication Still experiences painful episodic flares and daily pain.   Vitamin D  Insufficiency Last lab 25 She will take Vitamin D3   CHRONIC HTN: Reports controlled BP Current Meds - hydrochlorothiazide  12.5mg  daily, Lisinopril  10mg  daily   Reports good compliance, took meds today. Tolerating well, w/o complaints. Denies CP, dyspnea, HA, edema, dizziness / lightheadedness    Headaches, Episodic Migraine Still on Duloxetine  60mg  and Nortriptyline  50mg  on med rec today Requesting all refills for future. Duloxetine  does not seem to help her arthritis pain significantly Taking Advil Ibuprofen 200mg s  taking 2-3 pills twice a day. On Triptan PRN some relief, less headaches now    GAD with Panic Attacks   HYPERLIPIDEMIA: - Reports concerns. Last lipid panel 11/2023, elevated LDL 170s Prior history on statin but intolerance to myalgia Improving diet goal at this time and improve exercise  *** Right Wrist / Hand Pain, osteoarthritis / tendonitis Followed by Dr Hardin Czar - Hand Ortho - [facility] S/p 1 steroid injection R wrist with improvement ***follow up every 3 months  Vasomotor symptoms (night sweats and hot flashes) - Night sweats and hot flashes have recurred after a period of remission - Wakes up every morning with wet pajamas and a wet pillow - Symptoms negatively impact mood - Initial onset of symptoms 15 years ago, lasting for five years - Previously treated with hormone replacement therapy, discontinued due to age-related concerns  ***Left ear with some mild clear effusion compared to R ***No longer doing balance therapy   Gynecologic history Previously followed by Maryl SHIPPER, not for this issue - Uterine scraping performed a couple of years ago for bleeding, pathology was non-cancerous     ***Still experiencing ear pain She saw ENT and asked about tymp tubes for inner ear imbalance, they advised against it She has Left ear pain more than Right Admits sinus allergies. Tried Neti pot and it worsened vertigo. Using saline spray ***  ***   Health Maintenance: Due for Shingles vaccine.   Due for Prevnar-20 vaccine in the future.   Colonoscopy done 05/26/20, negative repeat 5 years.     12/28/2024    8:54 AM 11/04/2024  3:24 PM 08/06/2024   10:04 AM  Depression screen PHQ 2/9  Decreased Interest 0 0 1  Down, Depressed, Hopeless 0 0 1  PHQ - 2 Score 0 0 2  Altered sleeping 1 0 1  Tired, decreased energy 0 1 1  Change in appetite  0 1  Feeling bad or failure about yourself  0 0 0  Trouble concentrating 1 1 1   Moving slowly or fidgety/restless 0 0 0   Suicidal thoughts 0 0 0  PHQ-9 Score 2 2 6    Difficult doing work/chores Not difficult at all Somewhat difficult      Data saved with a previous flowsheet row definition       12/28/2024    8:55 AM 11/04/2024    3:25 PM 08/06/2024   10:04 AM 06/15/2024    1:12 PM  GAD 7 : Generalized Anxiety Score  Nervous, Anxious, on Edge 0 0 1 0  Control/stop worrying 0 0 0 0  Worry too much - different things 0 0 1 0  Trouble relaxing 0 1 1 0  Restless 0 0 0 0  Easily annoyed or irritable 1 0 1 1  Afraid - awful might happen 0 0 1 0  Total GAD 7 Score 1 1 5 1   Anxiety Difficulty Not difficult at all Somewhat difficult  Not difficult at all     Past Medical History:  Diagnosis Date   Allergy    Anxiety    Arthritis    in neck, hands   Cervical dystonia    bone spurs in neck   Complication of anesthesia    versed allergy   Depression    Fibromyalgia    muscle pain   Fibromyalgia    GERD (gastroesophageal reflux disease)    Hx of, no issues at this point   Glaucoma    closed angle glaucoma both eyes, laser treatment in past   Goiter    thyroid    Headache    sinus headaches, inner ear imbalance, light sensitivity   History of kidney stones    HLD (hyperlipidemia)    statin intolerance   Hypertension    controlled on meds   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   Plantar fasciitis    Sleep apnea    used CPAP, lost 60 pounds no issue now   Tinnitus    wears hearing aids   Varicose veins of both legs with edema    Vertigo    inner ear imbalance   Past Surgical History:  Procedure Laterality Date   CHOLECYSTECTOMY     COLONOSCOPY WITH PROPOFOL  N/A 05/26/2020   Procedure: COLONOSCOPY WITH PROPOFOL ;  Surgeon: Jinny Carmine, MD;  Location: [facility];  Service: Endoscopy;  Laterality: N/A;  priority 4   COSMETIC SURGERY Left    broken cheek bone   GALLBLADDER SURGERY     POLYPECTOMY  05/26/2020   Procedure: POLYPECTOMY;   Surgeon: Jinny Carmine, MD;  Location: [facility];  Service: Endoscopy;;   TONSILLECTOMY     TONSILLECTOMY     Social History   Socioeconomic History   Marital status: Married    Spouse name: Not on file   Number of children: Not on file   Years of education: Not on file   Highest education level: Not on file  Occupational History   Not on file  Tobacco Use   Smoking status: Never   Smokeless tobacco: Never  Vaping Use  Vaping status: Never Used  Substance and Sexual Activity   Alcohol use: Yes    Comment: socially   Drug use: No   Sexual activity: Yes    Birth control/protection: Post-menopausal  Other Topics Concern   Not on file  Social History Narrative   Not on file   Social Drivers of Health   Tobacco Use: Low Risk (12/28/2024)   Patient History    Smoking Tobacco Use: Never    Smokeless Tobacco Use: Never    Passive Exposure: Not on file  Financial Resource Strain: Not on file  Food Insecurity: Not on file  Transportation Needs: Not on file  Physical Activity: Not on file  Stress: Not on file  Social Connections: Not on file  Intimate Partner Violence: Not on file  Depression (PHQ2-9): Low Risk (12/28/2024)   Depression (PHQ2-9)    PHQ-2 Score: 2  Alcohol Screen: Low Risk (07/02/2023)   Alcohol Screen    Last Alcohol Screening Score (AUDIT): 0  Housing: Not on file  Utilities: Not on file  Health Literacy: Not on file   Family History  Problem Relation Age of Onset   Colon polyps Mother    Hypertension Mother    Goiter Mother    Anuerysm Father    AAA (abdominal aortic aneurysm) Father    Alcohol abuse Father    Healthy Sister    Breast cancer Sister 1   Healthy Sister    Healthy Sister    Vision loss Maternal Grandmother    Liver cancer Paternal Grandfather    Alcohol abuse Paternal Grandfather    Anuerysm Paternal Actor    Healthy Daughter    Healthy Son    Healthy Son    Colon cancer Maternal Uncle    Bladder Cancer Neg  Hx    Kidney cancer Neg Hx    Prostate cancer Neg Hx    Current Outpatient Medications on File Prior to Visit  Medication Sig   albuterol  (VENTOLIN  HFA) 108 (90 Base) MCG/ACT inhaler INHALE 1-2 PUFFS BY MOUTH EVERY 6 HOURS AS NEEDED FOR WHEEZE OR SHORTNESS OF BREATH   busPIRone  (BUSPAR ) 5 MG tablet TAKE 1 TABLET (5 MG TOTAL) BY MOUTH 3 (THREE) TIMES DAILY AS NEEDED (ANXIETY).   DULoxetine  (CYMBALTA ) 60 MG capsule TAKE 1 CAPSULE BY MOUTH EVERY DAY   fluticasone  (FLONASE ) 50 MCG/ACT nasal spray PLACE 2 SPRAYS INTO BOTH NOSTRILS DAILY. USE FOR 4-6 WEEKS THEN STOP AND USE SEASONALLY OR AS NEEDED.   hydrochlorothiazide  (HYDRODIURIL ) 12.5 MG tablet Take 1 tablet (12.5 mg total) by mouth daily.   lisinopril  (ZESTRIL ) 10 MG tablet Take 1 tablet (10 mg total) by mouth daily.   nortriptyline  (PAMELOR ) 50 MG capsule Take 1 capsule (50 mg total) by mouth at bedtime.   omeprazole  (PRILOSEC) 20 MG capsule Take 1 capsule (20 mg total) by mouth daily before breakfast.   ondansetron  (ZOFRAN -ODT) 4 MG disintegrating tablet Take 1 tablet (4 mg total) by mouth every 8 (eight) hours as needed for nausea or vomiting.   SUMAtriptan (IMITREX) 100 MG tablet Take by mouth.   Cholecalciferol (VITAMIN D3) 125 MCG (5000 UT) CAPS Take 1 capsule (5,000 Units total) by mouth daily. For 8 weeks, then start Vitamin D3 2,000 units daily (OTC) (Patient not taking: Reported on 12/28/2024)   pravastatin  (PRAVACHOL ) 10 MG tablet Take 1 tablet (10 mg total) by mouth every other day. In evening. (Patient not taking: Reported on 12/28/2024)   No current facility-administered medications on file prior to  visit.    Review of Systems Per HPI unless specifically indicated above     Objective:    BP 130/68 (BP Location: Left Arm, Patient Position: Sitting, Cuff Size: Normal)   Pulse 91   Ht 5' 5 (1.651 m)   Wt 201 lb 6 oz (91.3 kg)   LMP 02/03/2020   SpO2 95%   BMI 33.51 kg/m   Wt Readings from Last 3 Encounters:  12/28/24 201  lb 6 oz (91.3 kg)  11/30/24 200 lb 8 oz (90.9 kg)  11/04/24 198 lb 12.8 oz (90.2 kg)    Physical Exam  I have personally reviewed the radiology report from 05/09/22 on CT Coronary.  ADDENDUM REPORT: 05/09/2022 15:25   EXAM: OVER-READ INTERPRETATION  CT CHEST   The following report is an over-read performed by radiologist Dr. Waddell Cola [facility] on 05/09/2022. This over-read does not include interpretation of cardiac or coronary anatomy or pathology. The coronary calcium score and coronary CTA interpretation by the cardiologist is attached.   COMPARISON:  CT angio chest 02/26/2017   FINDINGS: Vascular: No acute abnormality.   Mediastinum/nodes: No mediastinal mass or adenopathy identified.   Lungs/pleura: No pleural effusion, airspace consolidation or atelectasis. Lungs are clear. No suspicious lung nodules.   Upper abdominal: No acute abnormality. Hepatic steatosis. Small hiatal hernia.   Musculoskeletal: No acute findings.   IMPRESSION: 1. Hepatic steatosis. 2. Small hiatal hernia.     Electronically Signed   By: Waddell Calk M.D.   On: 05/09/2022 15:25    Addended by Calk Waddell, MD on 05/09/2022  3:28 PM    Study Result  Narrative & Impression  CLINICAL DATA:  Chest pain   EXAM: Cardiac/Coronary  CTA   TECHNIQUE: The patient was scanned on a Siemens Somatom go.Top scanner.   : A retrospective scan was triggered in the descending thoracic aorta. Axial non-contrast 3 mm slices were carried out through the heart. The data set was analyzed on a dedicated work station and scored using the Agatson method. Gantry rotation speed was 330 msecs and collimation was .6 mm. 100mg  of metoprolol  and 0.8 mg of sl NTG was given. The 3D data set was reconstructed in 5% intervals of the 60-95 % of the R-R cycle. Diastolic phases were analyzed on a dedicated work station using MPR, MIP and VRT modes. The patient received 100 cc of contrast.    FINDINGS: Aorta:  Normal size.  No calcifications.  No dissection.   Aortic Valve:  Trileaflet.  No calcifications.   Coronary Arteries:  Normal coronary origin.  Right dominance.   RCA is a dominant artery that gives rise to PDA and PLA. There is no plaque.   Left main gives rise to LAD and LCX arteries.  LM has no disease.   LAD has no plaque.   LCX is a non-dominant artery that gives rise to two obtuse marginal branches. There is no plaque.   Other findings:   Normal pulmonary vein drainage into the left atrium.   Normal left atrial appendage without a thrombus.   Normal size of the pulmonary artery.   IMPRESSION: 1. Normal coronary calcium score of 0. Patient is low risk for coronary events.   2. Normal coronary origin with right dominance.   3. No evidence of CAD.   4. CAD-RADS 0. Consider non-atherosclerotic causes of chest pain.   Electronically Signed: By: Redell Cave M.D. On: 05/09/2022 15:04     Results for orders placed or performed  in visit on 12/20/24  VITAMIN D  25 Hydroxy (Vit-D Deficiency, Fractures)   Collection Time: 12/20/24  8:29 AM  Result Value Ref Range   Vit D, 25-Hydroxy 33 30 - 100 ng/mL  Comprehensive metabolic panel with GFR   Collection Time: 12/20/24  8:29 AM  Result Value Ref Range   Glucose, Bld 102 (H) 65 - 99 mg/dL   BUN 17 7 - 25 mg/dL   Creat 9.11 9.49 - 8.94 mg/dL   eGFR 73 > OR = 60 fO/fpw/8.26f7   BUN/Creatinine Ratio SEE NOTE: 6 - 22 (calc)   Sodium 138 135 - 146 mmol/L   Potassium 4.4 3.5 - 5.3 mmol/L   Chloride 100 98 - 110 mmol/L   CO2 28 20 - 32 mmol/L   Calcium 9.5 8.6 - 10.4 mg/dL   Total Protein 6.5 6.1 - 8.1 g/dL   Albumin 4.3 3.6 - 5.1 g/dL   Globulin 2.2 1.9 - 3.7 g/dL (calc)   AG Ratio 2.0 1.0 - 2.5 (calc)   Total Bilirubin 0.3 0.2 - 1.2 mg/dL   Alkaline phosphatase (APISO) 95 37 - 153 U/L   AST 16 10 - 35 U/L   ALT 27 6 - 29 U/L  CBC with Differential/Platelet   Collection Time: 12/20/24   8:29 AM  Result Value Ref Range   WBC 7.7 3.8 - 10.8 Thousand/uL   RBC 4.81 3.80 - 5.10 Million/uL   Hemoglobin 14.0 11.7 - 15.5 g/dL   HCT 56.6 64.0 - 53.9 %   MCV 90.0 81.4 - 101.7 fL   MCH 29.1 27.0 - 33.0 pg   MCHC 32.3 31.6 - 35.4 g/dL   RDW 87.4 88.9 - 84.9 %   Platelets 282 140 - 400 Thousand/uL   MPV 9.6 7.5 - 12.5 fL   Neutro Abs 4,697 1,500 - 7,800 cells/uL   Absolute Lymphocytes 1,964 850 - 3,900 cells/uL   Absolute Monocytes 393 200 - 950 cells/uL   Eosinophils Absolute 578 (H) 15 - 500 cells/uL   Basophils Absolute 69 0 - 200 cells/uL   Neutrophils Relative % 61 %   Total Lymphocyte 25.5 %   Monocytes Relative 5.1 %   Eosinophils Relative 7.5 %   Basophils Relative 0.9 %  Hemoglobin A1c   Collection Time: 12/20/24  8:29 AM  Result Value Ref Range   Hgb A1c MFr Bld 5.9 (H) <5.7 %   Mean Plasma Glucose 123 mg/dL   eAG (mmol/L) 6.8 mmol/L  Lipid panel   Collection Time: 12/20/24  8:29 AM  Result Value Ref Range   Cholesterol 273 (H) <200 mg/dL   HDL 54 > OR = 50 mg/dL   Triglycerides 731 (H) <150 mg/dL   LDL Cholesterol (Calc) 175 (H) mg/dL (calc)   Total CHOL/HDL Ratio 5.1 (H) <5.0 (calc)   Non-HDL Cholesterol (Calc) 219 (H) <130 mg/dL (calc)  TSH   Collection Time: 12/20/24  8:29 AM  Result Value Ref Range   TSH 1.32 0.40 - 4.50 mIU/L      Assessment & Plan:   Problem List Items Addressed This Visit     Fibromyalgia   Other Visit Diagnoses       Essential hypertension            Updated Health Maintenance information ***- Reviewed recent lab results with patient Encouraged improvement to lifestyle with diet and exercise -*** Goal of weight loss  Assessment and Plan Assessment & Plan    [facility] ***  No orders of the defined types were placed in this encounter.   No orders of the defined types were placed in this encounter.    Follow up plan: No follow-ups on  file.  Marsa Officer, DO [facility] 12/28/2024, 9:16 AM  "

## 2024-12-29 ENCOUNTER — Encounter: Payer: Self-pay | Admitting: Family Medicine
